# Patient Record
Sex: Male | Born: 1960 | Race: White | Hispanic: No | Marital: Married | Smoking: Former smoker
Health system: Southern US, Community
[De-identification: ages and names within clinical notes are randomized; demographics above are authoritative.]

## PROBLEM LIST (undated history)

## (undated) DIAGNOSIS — E785 Hyperlipidemia, unspecified: Secondary | ICD-10-CM

## (undated) DIAGNOSIS — J45909 Unspecified asthma, uncomplicated: Secondary | ICD-10-CM

## (undated) DIAGNOSIS — E119 Type 2 diabetes mellitus without complications: Secondary | ICD-10-CM

## (undated) DIAGNOSIS — J449 Chronic obstructive pulmonary disease, unspecified: Secondary | ICD-10-CM

## (undated) DIAGNOSIS — I4891 Unspecified atrial fibrillation: Secondary | ICD-10-CM

## (undated) HISTORY — PX: TONSILLECTOMY: SUR1361

## (undated) HISTORY — DX: Hyperlipidemia, unspecified: E78.5

## (undated) HISTORY — PX: SHOULDER SURGERY: SHX246

## (undated) HISTORY — PX: EYE SURGERY: SHX253

## (undated) HISTORY — PX: KNEE SURGERY: SHX244

## (undated) HISTORY — DX: Type 2 diabetes mellitus without complications: E11.9

---

## 1998-09-14 ENCOUNTER — Ambulatory Visit (HOSPITAL_COMMUNITY): Admission: RE | Admit: 1998-09-14 | Discharge: 1998-09-14 | Payer: Self-pay | Admitting: Orthopedic Surgery

## 2000-04-12 ENCOUNTER — Ambulatory Visit (HOSPITAL_BASED_OUTPATIENT_CLINIC_OR_DEPARTMENT_OTHER): Admission: RE | Admit: 2000-04-12 | Discharge: 2000-04-12 | Payer: Self-pay | Admitting: Orthopedic Surgery

## 2001-07-07 ENCOUNTER — Emergency Department (HOSPITAL_COMMUNITY): Admission: EM | Admit: 2001-07-07 | Discharge: 2001-07-07 | Payer: Self-pay | Admitting: Emergency Medicine

## 2001-07-07 ENCOUNTER — Encounter: Payer: Self-pay | Admitting: Emergency Medicine

## 2001-07-16 ENCOUNTER — Emergency Department (HOSPITAL_COMMUNITY): Admission: EM | Admit: 2001-07-16 | Discharge: 2001-07-16 | Payer: Self-pay | Admitting: Emergency Medicine

## 2002-01-08 ENCOUNTER — Emergency Department (HOSPITAL_COMMUNITY): Admission: EM | Admit: 2002-01-08 | Discharge: 2002-01-08 | Payer: Self-pay | Admitting: Emergency Medicine

## 2002-01-08 ENCOUNTER — Encounter: Payer: Self-pay | Admitting: Emergency Medicine

## 2003-12-26 ENCOUNTER — Emergency Department (HOSPITAL_COMMUNITY): Admission: EM | Admit: 2003-12-26 | Discharge: 2003-12-26 | Payer: Self-pay | Admitting: Emergency Medicine

## 2006-04-24 ENCOUNTER — Ambulatory Visit (HOSPITAL_COMMUNITY): Admission: RE | Admit: 2006-04-24 | Discharge: 2006-04-24 | Payer: Self-pay | Admitting: Internal Medicine

## 2007-09-17 ENCOUNTER — Ambulatory Visit (HOSPITAL_COMMUNITY): Admission: RE | Admit: 2007-09-17 | Discharge: 2007-09-17 | Payer: Self-pay | Admitting: Internal Medicine

## 2009-07-10 ENCOUNTER — Encounter: Payer: Self-pay | Admitting: Gastroenterology

## 2009-07-16 ENCOUNTER — Encounter: Payer: Self-pay | Admitting: Gastroenterology

## 2009-07-27 ENCOUNTER — Ambulatory Visit (HOSPITAL_COMMUNITY): Admission: RE | Admit: 2009-07-27 | Discharge: 2009-07-27 | Payer: Self-pay | Admitting: Gastroenterology

## 2009-07-27 ENCOUNTER — Ambulatory Visit: Payer: Self-pay | Admitting: Gastroenterology

## 2009-08-05 ENCOUNTER — Encounter: Payer: Self-pay | Admitting: Gastroenterology

## 2009-08-05 ENCOUNTER — Ambulatory Visit: Admission: RE | Admit: 2009-08-05 | Discharge: 2009-08-05 | Payer: Self-pay | Admitting: Internal Medicine

## 2009-08-05 ENCOUNTER — Encounter: Payer: Self-pay | Admitting: Pulmonary Disease

## 2009-08-31 ENCOUNTER — Encounter: Payer: Self-pay | Admitting: Gastroenterology

## 2009-09-10 DIAGNOSIS — E785 Hyperlipidemia, unspecified: Secondary | ICD-10-CM

## 2009-09-10 DIAGNOSIS — E1165 Type 2 diabetes mellitus with hyperglycemia: Secondary | ICD-10-CM

## 2009-09-11 ENCOUNTER — Ambulatory Visit: Payer: Self-pay | Admitting: Pulmonary Disease

## 2009-09-11 DIAGNOSIS — G4733 Obstructive sleep apnea (adult) (pediatric): Secondary | ICD-10-CM | POA: Insufficient documentation

## 2009-10-19 ENCOUNTER — Ambulatory Visit: Payer: Self-pay | Admitting: Pulmonary Disease

## 2009-11-04 ENCOUNTER — Telehealth (INDEPENDENT_AMBULATORY_CARE_PROVIDER_SITE_OTHER): Payer: Self-pay | Admitting: *Deleted

## 2009-12-22 ENCOUNTER — Telehealth (INDEPENDENT_AMBULATORY_CARE_PROVIDER_SITE_OTHER): Payer: Self-pay | Admitting: *Deleted

## 2010-02-16 NOTE — Letter (Signed)
Summary: Internal Other  Internal Other   Imported By: Peggyann Shoals 08/05/2009 10:02:57  _____________________________________________________________________  External Attachment:    Type:   Image     Comment:   External Document

## 2010-02-16 NOTE — Letter (Signed)
Summary: Internal Other Brett Curry  Internal Other Brett Curry   Imported By: Cloria Spring LPN 11/91/4782 95:62:13  _____________________________________________________________________  External Attachment:    Type:   Image     Comment:   External Document

## 2010-02-16 NOTE — Letter (Signed)
Summary: ENDO RESULT FROM [facility]  ENDO RESULT FROM [facility]   Imported By: Rexene Alberts 08/31/2009 14:21:18  _____________________________________________________________________  External Attachment:    Type:   Image     Comment:   External Document

## 2010-02-16 NOTE — Assessment & Plan Note (Signed)
Summary: consult for management of osa   Copy to:  Carylon Perches Primary Provider/Referring Provider:  Carylon Perches  CC:  Sleep Consult.  History of Present Illness: The pt is a 50y/o male who I have been asked to see for management of osa.  He recently had npsg at [facility], where by split night he had severe osa.  He had 158 obstructive events in the first of sleep, and this gave him an AHI of 90/hr.  He was started on cpap, and found to have optimal pressure of 16cm of water.  The pt has been noted to have loud snoring, and admits to gasping arousals during the night.  He goes to bed around 10pm, and arises at 6:30 am to start his day.  He has frequent awakenings, and is not rested upon arising during the day.  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  He feels he can take a nap almost anytime.  He dozes with watching tv or movies, and does admit to some sleep pressure with driving.  His weight is neutral over the last 2 years.    Current Medications (verified): 1)  Amaryl 2 Mg Tabs (Glimepiride) .... Take 1 Tablet By Mouth Once A Day 2)  Simvastatin 40 Mg Tabs (Simvastatin) .... Take 1 Tab By Mouth At Bedtime 3)  Accolate 20 Mg Tabs (Zafirlukast) .... Take 1 Tablet By Mouth Two Times A Day 4)  Lisinopril 20 Mg Tabs (Lisinopril) .... Take 1 Tablet By Mouth Once A Day 5)  Tramadol Hcl 50 Mg Tabs (Tramadol Hcl) .... Take 1 Tablet By Mouth Three Times A Day As Needed 6)  Albuterol Sulfate 4 Mg Tabs (Albuterol Sulfate) .... Take 1 Tablet By Mouth Two Times A Day 7)  Theophylline Cr 300 Mg Xr12h-Tab (Theophylline) .... Take 1 Tablet By Mouth Two Times A Day 8)  Metformin Hcl 1000 Mg Tabs (Metformin Hcl) .... Take 1 Tablet By Mouth Two Times A Day  Allergies (verified): No Known Drug Allergies  Past History:  Past Medical History: Reviewed history from 09/10/2009 and no changes required.  HYPERLIPIDEMIA  (ICD-272.4) DIABETES MELLITUS, TYPE II (ICD-250.00)  Past Surgical History: R shoulder surgery R knee surgery tonsillectomy  Family History: asthma: sister heart disease: mother (m.i.)   Social History: Patient is a current smoker. 1 1/2 ppd.  started at age 52. pt is married. pt works as a Print production planner       The patient complains of shortness of breath with activity, tooth/dental problems, and joint stiffness or pain.  The patient denies shortness of breath at rest, productive cough, non-productive cough, coughing up blood, chest pain, irregular heartbeats, acid heartburn, indigestion, loss of appetite, weight change, abdominal pain, difficulty swallowing, sore throat, headaches, nasal congestion/difficulty breathing through nose, sneezing, itching, ear ache, anxiety, depression, hand/feet swelling, rash, change in color of mucus, and fever.    Vital Signs:  Patient profile:   50 year old male Height:      72 inches Weight:      363.38 pounds BMI:     49.46 O2 Sat:      95 % on Room air Temp:     97.9 degrees F oral Pulse rate:   118 / minute BP sitting:   120 / 50  (right arm) Cuff size:   large  Vitals Entered By: Arman Filter LPN (September 11, 2009 10:01 AM)  O2 Flow:  Room air CC: Sleep Consult Comments Medications reviewed with patient Arman Filter LPN  September 11, 2009 10:02 AM    Physical Exam  General:  obese male in nad Eyes:  PERRLA and EOMI.   Nose:  narrowed bilat, turbinate hypertrophy Mouth:  very long palate and uvula with narrowing posteriorly Neck:  large, no obvious jvd, tmg, LN Lungs:  fairly clear, no wheezing Heart:  rrr, no mrg Abdomen:  soft and nontender, bs+ Extremities:  2+ edema with venous stasis.  No cyanosis  pulses intact distally, but decreased Neurologic:  alert and oriented, moves all 4.   Impression & Recommendations:  Problem # 1:  OBSTRUCTIVE SLEEP APNEA (ICD-327.23) the pt has severe osa diagnosed by  recent sleep study, and will need aggressive treatment in order to promote improved alertness during the day, and to avoid the known medical complications associated with this disorder.  He is also in a high risk job that can put himself and others at risk if he were to fall asleep.  I have had a long discussion with the pt about sleep apnea, including its impact on QOL and CV health.  I have recommended treatment with cpap while he is working on weight loss.  I will set the patient up on cpap at a moderate pressure level to allow for desensitization, and will troubleshoot the device over the next 4-6weeks if needed.  The pt is to call me if having issues with tolerance.  Will then optimize the pressure once patient is able to wear cpap on a consistent basis.  I have also reminded the pt of his moral responsibility to not drive if he is sleepy.  Medications Added to Medication List This Visit: 1)  Accolate 20 Mg Tabs (Zafirlukast) .... Take 1 tablet by mouth two times a day 2)  Lisinopril 20 Mg Tabs (Lisinopril) .... Take 1 tablet by mouth once a day 3)  Tramadol Hcl 50 Mg Tabs (Tramadol hcl) .... Take 1 tablet by mouth three times a day as needed 4)  Albuterol Sulfate 4 Mg Tabs (Albuterol sulfate) .... Take 1 tablet by mouth two times a day 5)  Theophylline Cr 300 Mg Xr12h-tab (Theophylline) .... Take 1 tablet by mouth two times a day 6)  Metformin Hcl 1000 Mg Tabs (Metformin hcl) .... Take 1 tablet by mouth two times a day  Other Orders: Consultation Level IV (56387) DME Referral (DME)  Patient Instructions: 1)  will start on cpap at moderate pressure level.  Please call if tolerance issues. 2)  work on weight loss 3)  followup with me in 5weeks.

## 2010-02-16 NOTE — Progress Notes (Signed)
Summary: cpap pressure- still waiting  Phone Note Call from Patient Call back at Home Phone [phone]   Caller: Patient Call For: clance Summary of Call: pt was seen 17 days ago by kc. pt was to have pressure on cpap increased to 16 but has not yet heard from [facility]. pt wants to speak to someone re: this asap today.  Initial call taken by: Tivis Ringer, CNA,  November 04, 2009 11:43 AM  Follow-up for Phone Call        spoke to portia and michelle @[facility]  pt will be called today they have been waiting on [employer] to respond to the request pt aware  Follow-up by: Oneita Jolly,  November 04, 2009 11:53 AM

## 2010-02-16 NOTE — Assessment & Plan Note (Signed)
Summary: rov for osa   Visit Type:  Follow-up Copy to:  Carylon Perches Primary Mylan Schwarz/Referring Imajean Mcdermid:  Carylon Perches  CC:  5 week follow up. pt states he uses cpap 7/7 nights x 8-10 hrs a night. Pt states he has no problems with machine/mask/pressure. Marland Kitchen  History of Present Illness: the pt comes in today for f/u of his osa.  He was started on cpap last visit, and has been doing well with the device.  He has no pressure or mask issues, and is sleeping much better.  His alertness during the day is improved as well.  His wife has not seen breakthru events or snoring.  Current Medications (verified): 1)  Amaryl 2 Mg Tabs (Glimepiride) .... Take 1 Tablet By Mouth Once A Day 2)  Simvastatin 40 Mg Tabs (Simvastatin) .... Take 1 Tab By Mouth At Bedtime 3)  Accolate 20 Mg Tabs (Zafirlukast) .... Take 1 Tablet By Mouth Two Times A Day 4)  Lisinopril 20 Mg Tabs (Lisinopril) .... Take 1 Tablet By Mouth Once A Day 5)  Tramadol Hcl 50 Mg Tabs (Tramadol Hcl) .... Take 1 Tablet By Mouth Three Times A Day As Needed 6)  Albuterol Sulfate 4 Mg Tabs (Albuterol Sulfate) .... Take 1 Tablet By Mouth Two Times A Day 7)  Theophylline Cr 300 Mg Xr12h-Tab (Theophylline) .... Take 1 Tablet By Mouth Two Times A Day 8)  Metformin Hcl 1000 Mg Tabs (Metformin Hcl) .... Take 1 Tablet By Mouth Two Times A Day  Allergies (verified): No Known Drug Allergies  Review of Systems       The patient complains of shortness of breath with activity and joint stiffness or pain.  The patient denies shortness of breath at rest, productive cough, non-productive cough, coughing up blood, chest pain, irregular heartbeats, acid heartburn, indigestion, loss of appetite, weight change, abdominal pain, difficulty swallowing, sore throat, tooth/dental problems, headaches, nasal congestion/difficulty breathing through nose, sneezing, itching, ear ache, anxiety, depression, hand/feet swelling, rash, change in color of mucus, and fever.    Vital  Signs:  Patient profile:   50 year old male Height:      72 inches Weight:      371.25 pounds O2 Sat:      92 % on Room air Temp:     98.1 degrees F oral Pulse rate:   100 / minute BP sitting:   122 / 68  (right arm) Cuff size:   large  Vitals Entered By: Carver Fila (October 19, 2009 3:43 PM)  O2 Flow:  Room air CC: 5 week follow up. pt states he uses cpap 7/7 nights x 8-10 hrs a night. Pt states he has no problems with machine/mask/pressure.  Comments meds and allergies updated Phone number updated Carver Fila  October 19, 2009 3:44 PM    Physical Exam  General:  obese male in nad  Nose:  no skin breakdown or pressure necrosis from cpap mask Extremities:  no edema or cyanosis  Neurologic:  alert and oriented, moves all 4. does not appear sleepy   Impression & Recommendations:  Problem # 1:  OBSTRUCTIVE SLEEP APNEA (ICD-327.23) the pt is doing very well with cpap.  He is sleeping better, no breakthru snoring per wife, and has improved alertness during the day.  He is having no issues with pressure or cpap mask.  At this point, will need to get his machine pressure increased to his optimal level of 16cm.  I have also encouraged him to  work aggressively on weight loss.  He will followup with me in 6mos, or sooner if having issues with tolerance.    Other Orders: DME Referral (DME) Est. Patient Level III ([id number])  Patient Instructions: 1)  will have your pressure increased to 16cm .  Please call if any tolerance issues. 2)  work on weight loss 3)  followup with me in 6mos.   Immunization History:  Influenza Immunization History:    Influenza:  historical (10/17/2008)  Pneumovax Immunization History:    Pneumovax:  historical (10/17/2005)

## 2010-02-16 NOTE — Letter (Signed)
Summary: TRIAGE  TRIAGE   Imported By: Diana Eves 07/10/2009 11:57:16  _____________________________________________________________________  External Attachment:    Type:   Image     Comment:   External Document  Appended Document: TRIAGE Hold Glimeperide on AM of procedure. Continue ASA. TRILYTE prep.  Appended Document: TRIAGE Rx and instructions mailed to the pt.

## 2010-02-16 NOTE — Progress Notes (Signed)
  Phone Note Other Incoming   Request: Send information Summary of Call: Request for records received from [facility]. Request forwarded to [facility].     

## 2010-02-24 ENCOUNTER — Inpatient Hospital Stay (HOSPITAL_COMMUNITY)
Admission: AD | Admit: 2010-02-24 | Discharge: 2010-03-01 | DRG: 192 | Disposition: A | Discharge status: Home / Self Care | Payer: Managed Care, Other (non HMO) | Source: Ambulatory Visit | Attending: Internal Medicine | Admitting: Internal Medicine

## 2010-02-24 ENCOUNTER — Inpatient Hospital Stay (HOSPITAL_COMMUNITY): Payer: Managed Care, Other (non HMO)

## 2010-02-24 DIAGNOSIS — E785 Hyperlipidemia, unspecified: Secondary | ICD-10-CM | POA: Diagnosis present

## 2010-02-24 DIAGNOSIS — IMO0001 Reserved for inherently not codable concepts without codable children: Secondary | ICD-10-CM | POA: Diagnosis present

## 2010-02-24 DIAGNOSIS — I872 Venous insufficiency (chronic) (peripheral): Secondary | ICD-10-CM | POA: Diagnosis present

## 2010-02-24 DIAGNOSIS — I10 Essential (primary) hypertension: Secondary | ICD-10-CM | POA: Diagnosis present

## 2010-02-24 DIAGNOSIS — M1990 Unspecified osteoarthritis, unspecified site: Secondary | ICD-10-CM | POA: Diagnosis present

## 2010-02-24 DIAGNOSIS — G4733 Obstructive sleep apnea (adult) (pediatric): Secondary | ICD-10-CM | POA: Diagnosis present

## 2010-02-24 DIAGNOSIS — F17200 Nicotine dependence, unspecified, uncomplicated: Secondary | ICD-10-CM | POA: Diagnosis present

## 2010-02-24 DIAGNOSIS — J441 Chronic obstructive pulmonary disease with (acute) exacerbation: Principal | ICD-10-CM | POA: Diagnosis present

## 2010-02-24 LAB — COMPREHENSIVE METABOLIC PANEL
ALT: 22 U/L (ref 0–53)
AST: 22 U/L (ref 0–37)
CO2: 29 mEq/L (ref 19–32)
Calcium: 9.3 mg/dL (ref 8.4–10.5)
Chloride: 98 mEq/L (ref 96–112)
GFR calc Af Amer: >60 mL/min (ref >60)
GFR calc non Af Amer: >60 mL/min (ref >60)
Glucose, Bld: 163 mg/dL — ABNORMAL HIGH (ref 70–99)
Sodium: 140 mEq/L (ref 135–145)
Total Bilirubin: 0.5 mg/dL (ref 0.3–1.2)

## 2010-02-24 LAB — BLOOD GAS, ARTERIAL
Acid-Base Excess: 1.9 mmol/L (ref 0.0–2.0)
Acid-Base Excess: 2.4 mmol/L — ABNORMAL HIGH (ref 0.0–2.0)
Bicarbonate: 26 mEq/L — ABNORMAL HIGH (ref 20.0–24.0)
Bicarbonate: 26.9 mEq/L — ABNORMAL HIGH (ref 20.0–24.0)
O2 Saturation: 87.9 %
O2 Saturation: 91.1 %
Patient temperature: 37
TCO2: 22.6 mmol/L (ref 0–100)
TCO2: 23.6 mmol/L (ref 0–100)
pCO2 arterial: 40.7 mmHg (ref 35.0–45.0)
pO2, Arterial: 55.9 mmHg — ABNORMAL LOW (ref 80.0–100.0)

## 2010-02-24 LAB — DIFFERENTIAL
Basophils Absolute: 0 10*3/uL (ref 0.0–0.1)
Basophils Relative: 0 % (ref 0–1)
Lymphocytes Relative: 6 % — ABNORMAL LOW (ref 12–46)
Monocytes Absolute: 0.9 10*3/uL (ref 0.1–1.0)
Monocytes Relative: 5 % (ref 3–12)
Neutro Abs: 15.7 10*3/uL — ABNORMAL HIGH (ref 1.7–7.7)
Neutrophils Relative %: 88 % — ABNORMAL HIGH (ref 43–77)

## 2010-02-24 LAB — GLUCOSE, CAPILLARY: Glucose-Capillary: 232 mg/dL — ABNORMAL HIGH (ref 70–99)

## 2010-02-24 LAB — CBC
HCT: 42.8 % (ref 39.0–52.0)
Hemoglobin: 14.5 g/dL (ref 13.0–17.0)
MCH: 31.1 pg (ref 26.0–34.0)
MCHC: 33.9 g/dL (ref 30.0–36.0)
RBC: 4.66 MIL/uL (ref 4.22–5.81)

## 2010-02-24 LAB — THEOPHYLLINE LEVEL: Theophylline Lvl: 6.4 ug/mL — ABNORMAL LOW (ref 10.0–20.0)

## 2010-02-25 LAB — GLUCOSE, CAPILLARY
Glucose-Capillary: 184 mg/dL — ABNORMAL HIGH (ref 70–99)
Glucose-Capillary: 237 mg/dL — ABNORMAL HIGH (ref 70–99)
Glucose-Capillary: 232 mg/dL — ABNORMAL HIGH (ref 70–99)

## 2010-02-26 LAB — GLUCOSE, CAPILLARY
Glucose-Capillary: 211 mg/dL — ABNORMAL HIGH (ref 70–99)
Glucose-Capillary: 211 mg/dL — ABNORMAL HIGH (ref 70–99)

## 2010-02-27 LAB — BASIC METABOLIC PANEL
Calcium: 9.1 mg/dL (ref 8.4–10.5)
GFR calc Af Amer: >60 mL/min (ref >60)
GFR calc non Af Amer: >60 mL/min (ref >60)
Glucose, Bld: 204 mg/dL — ABNORMAL HIGH (ref 70–99)
Sodium: 146 mEq/L — ABNORMAL HIGH (ref 135–145)

## 2010-02-27 LAB — DIFFERENTIAL
Basophils Absolute: 0 10*3/uL (ref 0.0–0.1)
Eosinophils Relative: 0 % (ref 0–5)
Lymphocytes Relative: 8 % — ABNORMAL LOW (ref 12–46)
Neutrophils Relative %: 87 % — ABNORMAL HIGH (ref 43–77)

## 2010-02-27 LAB — CBC
MCH: 30.8 pg (ref 26.0–34.0)
MCHC: 32.5 g/dL (ref 30.0–36.0)
Platelets: 187 10*3/uL (ref 150–400)
RBC: 4.81 MIL/uL (ref 4.22–5.81)

## 2010-02-27 LAB — GLUCOSE, CAPILLARY
Glucose-Capillary: 206 mg/dL — ABNORMAL HIGH (ref 70–99)
Glucose-Capillary: 256 mg/dL — ABNORMAL HIGH (ref 70–99)
Glucose-Capillary: 290 mg/dL — ABNORMAL HIGH (ref 70–99)

## 2010-02-28 LAB — GLUCOSE, CAPILLARY
Glucose-Capillary: 340 mg/dL — ABNORMAL HIGH (ref 70–99)
Glucose-Capillary: 342 mg/dL — ABNORMAL HIGH (ref 70–99)

## 2010-03-01 LAB — GLUCOSE, CAPILLARY: Glucose-Capillary: 359 mg/dL — ABNORMAL HIGH (ref 70–99)

## 2010-03-06 NOTE — Progress Notes (Signed)
  NAMEJAKWAN, SALLY                ACCOUNT NO.:  [id number]  MEDICAL RECORD NO.:  [id number]           PATIENT TYPE:  I  LOCATION:  A328                          FACILITY:  [facility]  PHYSICIAN:  Mila Homer. Sudie Bailey, M.D.DATE OF BIRTH:  1960-06-19  DATE OF PROCEDURE:  02/28/2010 DATE OF DISCHARGE:                                PROGRESS NOTE   SUBJECTIVE:  He is feeling really a good deal better.  He tells me he really has not craved a cigarette at all in the time seen in the hospitalization his admission.  He has really been off cigarettes now for about a week.  OBJECTIVE:  GENERAL:  He is sitting up in bed. VITAL SIGNS:  Temperature 97.5, pulse 97, respiratory rate 23, blood pressure 135/84.  O2 sat is 95% on 5 liters. He is eating lunch.  He is in no acute distress.  Well-developed and morbidly obese.  His color is good.  His sensorium is intact.  Speech is normal. LUNGS:  Show decreased breath sounds throughout and some prolonged expirations and expiratory wheezing throughout but these are mild. HEART:  Has a regular rhythm, rate of about 80.  Glucoses have been 266 and 290.  ASSESSMENT: 1. Chronic obstructive pulmonary disease exacerbation. 2. Type 2 diabetes, uncontrolled.  This is probably worse due to his     steroid use. 3. Benign essential hypertension. 4. Sleep apnea. 5. Morbid obesity. 6. Tobacco use disorder.  PLAN:  Will continue IV antibiotics, bronchodilators and IV corticosteroids with O2 and CPAP at night as before.  IV steroids may be able to be switched to p.o. steroids tomorrow.  He continues on glimepiride and metformin for diabetes and lisinopril for hypertension.     Mila Homer. Sudie Bailey, M.D.     SDK/MEDQ  D:  02/28/2010  T:  02/28/2010  Job:  [id number]  Electronically Signed by John Giovanni M.D. on 03/06/2010 05:20:47 AM

## 2010-03-06 NOTE — Progress Notes (Signed)
  NAMEOBIE, KALLENBACH                ACCOUNT NO.:  [id number]  MEDICAL RECORD NO.:  [id number]           PATIENT TYPE:  I  LOCATION:  A203                          FACILITY:  [facility]  PHYSICIAN:  Mila Homer. Sudie Bailey, M.D.DATE OF BIRTH:  1960-04-17  DATE OF PROCEDURE: DATE OF DISCHARGE:                                PROGRESS NOTE   SUBJECTIVE:  This 50 year old was admitted to the hospital yesterday with COPD exacerbation.  He was fairly dyspneic last night and had a lot of coughing.  OBJECTIVE:  VITAL SIGNS:  Temperature today is 98.2, pulse 98, respiratory rate 24, blood pressure 126/75. GENERAL:  He is sitting up in bed, feet on the floor.  He is on O2.  His color is good, but he has somewhat prolonged expirations and coughing. LUNGS:  Sounds are distant.  There are no obvious intercostal retractions or use of accessory muscles of respiration. HEART:  Sounds are faint, but rate around 90. EXTREMITIES:  He has at least 1+ pitting edema of the distal legs with brawny discoloration of the distal legs in the mid lower leg down to the feet.  LABORATORY DATA:  Today's white cell count is 14,800 of which 87% are neutrophils, 8 lymphs, hemoglobin 14.8,  sodium 146, potassium 5.2, glucose 204.  ASSESSMENT: 1. Chronic obstructive pulmonary disease exacerbation. 2. Diabetes, possibly worse on steroids. 3. Benign essential hypertension. 4. Sleep apnea. 5. Morbid obesity.  PLAN:  Continue IV antibiotics, bronchodilators and IV corticosteroids along with O2 and CPAP at night.     Mila Homer. Sudie Bailey, M.D.     SDK/MEDQ  D:  02/27/2010  T:  02/27/2010  Job:  [id number]  Electronically Signed by John Giovanni M.D. on 03/06/2010 05:20:18 AM

## 2010-03-21 NOTE — H&P (Signed)
Brett Curry, Brett Curry                ACCOUNT NO.:  [id number]  MEDICAL RECORD NO.:  [id number]          PATIENT TYPE:  INP  LOCATION:  A325                          FACILITY:  [facility]  PHYSICIAN:  Kingsley Callander. Ouida Sills, MD       DATE OF BIRTH:  16-Feb-1960  DATE OF ADMISSION:  02/24/2010 DATE OF DISCHARGE:  LH                             HISTORY & PHYSICAL   CHIEF COMPLAINT:  Difficulty breathing.  HISTORY OF PRESENT ILLNESS:  This patient is a 50 year old white male who presented with increasing shortness of breath, cough and wheezing. The patient has been seen 2 days prior to admission and had been treated for a COPD exacerbation with [name], prednisone, albuterol, ipratropium and his usual asthma therapies including theophylline and Accolate.  He has had initiation of home nebulizer therapy without significant improvement.  He has not previously been on supplemental oxygen.  His oxygen saturation was 91% a day.  His oxygen saturation was initially 83% 2 days ago, but improved to 92% with treatment.  He has been a long-term smoker and continues to smoke 2 packs per day.  He has sleep apnea treated with CPAP.  He is morbidly obese.  PAST MEDICAL HISTORY: 1. Diabetes. 2. Hypertension. 3. Hyperlipidemia. 4. Sleep apnea. 5. Asthma/COPD. 6. Colon polyps. 7. Osteoarthritis, status post right shoulder surgery and right knee     surgery. 8. Tonsillectomy.  MEDICATIONS: 1. Ventolin and Atrovent nebulizer treatments q.4 h. 2. [name]. 3. Z-Pak. 4. Prednisone 40 mg daily. 5. Accolate 20 mg b.i.d. 6. Theophylline CR 300 mg b.i.d. 7. Albuterol 4 mg b.i.d. 8. Lisinopril 20 mg daily. 9. Tramadol 50 mg q.i.d. p.r.n. 10.Metformin ER 2000 mg daily. 11.Amaryl 2 mg daily. 12.Aspirin 81 mg daily. 13.Fish oil 1000 mg b.i.d. 14.Simvastatin 40 mg daily.  ALLERGIES:  None.  FAMILY HISTORY:  His mother had a heart attack, diabetes, bypass surgery and hypertension.  His father's health was  unknown.  A brother has had hypertension.  A sister has had asthma.  REVIEW OF SYSTEMS:  Noncontributory.  PHYSICAL EXAMINATION:  VITAL SIGNS:  Temperature 98.2, blood pressure 108/86, pulse 118, oxygen saturation 91%. GENERAL:  Dyspneic, obese white male. HEENT:  Eyes, nose and oropharynx are unremarkable. NECK:  No lymphadenopathy or JVD. LUNGS:  Bilateral wheezes with diffusely diminished breath sounds.  He is tachypneic at approximately 24 breaths per minute.  HEART: Tachycardic with no murmurs. ABDOMEN:  Nontender with no hepatosplenomegaly. EXTREMITIES:  Chronic venous insufficiency changes with mild edema in the lower legs.  No cyanosis or clubbing. NEURO:  Grossly intact. LYMPH NODES:  No cervical or supraclavicular enlargement. SKIN:  Warm and dry.  LABORATORY DATA:  Pending.  IMPRESSION AND PLAN: 1. Chronic obstructive pulmonary disease exacerbation.  He is worse     despite maximum home therapy.  He will be hospitalized and treated     with IV antibiotics, inhaled bronchodilators, IV corticosteroids     and supplemental oxygen.  A chest x-ray and blood gases will be     obtained now. 2. Diabetes.  Treat with sliding scale NovoLog.  We will hold  metformin and Amaryl in his current condition. 3. Hypertension. 4. Hyperlipidemia. 5. Osteoarthritis. 6. Sleep apnea.  Continue CPAP. 7. Colon polyps.     Kingsley Callander. Ouida Sills, MD     ROF/MEDQ  D:  02/24/2010  T:  02/25/2010  Job:  [id number]  Electronically Signed by Carylon Perches MD on 03/21/2010 11:16:20 PM

## 2010-03-21 NOTE — Discharge Summary (Signed)
  Brett Curry, Brett Curry                ACCOUNT NO.:  [id number]  MEDICAL RECORD NO.:  [id number]           PATIENT TYPE:  I  LOCATION:  A328                          FACILITY:  [facility]  PHYSICIAN:  Kingsley Callander. Ouida Sills, MD       DATE OF BIRTH:  1960-11-24  DATE OF ADMISSION:  02/24/2010 DATE OF DISCHARGE:  02/13/2012LH                              DISCHARGE SUMMARY   DISCHARGE DIAGNOSES: 1. Chronic obstructive pulmonary disease exacerbation. 2. Diabetes. 3. Hypertension. 4. Sleep apnea. 5. Hyperlipidemia. 6. Colon polyps. 7. Osteoarthritis.  DISCHARGE MEDICATIONS: 1. Ventolin and Atrovent nebulizer treatments 4 times a day. 2. Oxygen 3 liters by nasal cannula. 3. [name] 250 mg daily. 4. Prednisone 10 mg 4 a day for 3 days, 3 a day for 3 days, 2 a day     for 3 days, 1 a day for 3 days, 1/2 a day for 3 days. 5. Accolate 20 mg b.i.d. 6. Lisinopril 20 mg daily. 7. Tramadol 50 mg q.i.d. p.r.n. 8. Metformin ER 2000 mg daily. 9. Amaryl 2 mg daily. 10.Aspirin 81 mg daily. 11.Simvastatin 40 mg daily.  HOSPITAL COURSE:  This patient is a 50 year old male who presented with shortness of breath, wheezing, and cough.  He was found to have a COPD exacerbation.  His oxygen saturation was in the low 80s on room air.  He has a history of smoking 2 packs per day.  His chest x-ray revealed COPD.  His ABG initially revealed a pH of 7.42, pCO2 of 40, and a pO2 of 55.  White count was 17.7 with 88 segs.  Glucose was 163.  Theophylline level was 6.4.  He had a repeat ABG on oxygen at 4.5 liter revealing a pH of 7.39, pCO2 of 44, and a pO2 of 65.  He was treated with Ventolin and Atrovent nebulizer treatments, IV Solu-Medrol, and IV [name].  He gradually improved.  He required continued supplemental oxygen to maintain saturations above 90%.  He will require home oxygen.  He was significantly improved and stable for discharge on the morning of March 01, 2010.  He will be seen in followup in the  office in 2 weeks.  He will be continued on oral prednisone, oral [name], Accolate, and Ventolin and Atrovent nebulizer treatments.  Theophylline and oral albuterol were discontinued.  His diabetes was treated with sliding scale NovoLog in addition to Amaryl and metformin.     Kingsley Callander. Ouida Sills, MD     ROF/MEDQ  D:  03/01/2010  T:  03/01/2010  Job:  [id number]  Electronically Signed by Carylon Perches MD on 03/21/2010 11:16:17 PM

## 2010-04-04 LAB — GLUCOSE, CAPILLARY: Glucose-Capillary: 142 mg/dL — ABNORMAL HIGH (ref 70–99)

## 2010-04-19 ENCOUNTER — Ambulatory Visit: Payer: Self-pay | Admitting: Pulmonary Disease

## 2010-04-20 ENCOUNTER — Encounter: Payer: Self-pay | Admitting: Pulmonary Disease

## 2010-04-22 ENCOUNTER — Ambulatory Visit (INDEPENDENT_AMBULATORY_CARE_PROVIDER_SITE_OTHER): Payer: Managed Care, Other (non HMO) | Admitting: Pulmonary Disease

## 2010-04-22 ENCOUNTER — Encounter: Payer: Self-pay | Admitting: Pulmonary Disease

## 2010-04-22 VITALS — BP 92/58 | HR 96 | Temp 98.6°F | Ht 73.0 in | Wt 376.6 lb

## 2010-04-22 DIAGNOSIS — G4733 Obstructive sleep apnea (adult) (pediatric): Secondary | ICD-10-CM

## 2010-04-22 NOTE — Assessment & Plan Note (Signed)
The pt is wearing cpap compliantly, and feels it is helping sleep quality.  He has not seen a huge difference in his daytime fatigue, but I have explained this is different from true sleepiness.  He is having some issues with mask leaks, and I have showed him a different mask to consider.  I have asked him to stay on cpap, and to work on weight loss aggressively.

## 2010-04-22 NOTE — Progress Notes (Signed)
  Subjective:    Patient ID: Brett Curry, male    DOB: [birth year], 50 y.o.   MRN: [id number]  HPI The pt comes in today for f/u of his known osa.  He is wearing cpap very compliantly by his download, and feels that he is sleeping better.  He has no issues with pressure, but is having mask leak issues at times.  It does not bother him, but does bother his wife.  His download does show significant leak as well.  He is always tired, but has a lot of other medical issues and is morbidly obese.    Review of Systems  Constitutional: Negative for fever and unexpected weight change.  HENT: Negative for ear pain, nosebleeds, congestion, sore throat, rhinorrhea, sneezing, trouble swallowing, dental problem, postnasal drip and sinus pressure.   Eyes: Negative for redness and itching.  Respiratory: Positive for shortness of breath and wheezing. Negative for cough and chest tightness.   Cardiovascular: Positive for chest pain and leg swelling. Negative for palpitations.  Gastrointestinal: Negative for nausea and vomiting.  Genitourinary: Negative for dysuria.  Musculoskeletal: Negative for joint swelling.  Skin: Negative for rash.  Neurological: Negative for headaches.  Hematological: Does not bruise/bleed easily.  Psychiatric/Behavioral: Negative for dysphoric mood. The patient is not nervous/anxious.        Objective:   Physical Exam Obese male in nad No skin breakdown or pressure necrosis from cpap mask  Cor with rrr LE with mild edema, no cyanosis  Alert, does not appear sleepy, moves all 4        Assessment & Plan:

## 2010-04-22 NOTE — Progress Notes (Deleted)
  Subjective:    Patient ID: Brett Curry, male    DOB: [birth year], 50 y.o.   MRN: [id number]  HPI    Review of Systems  Constitutional: Negative for fever and unexpected weight change.  HENT: Negative for ear pain, nosebleeds, congestion, sore throat, rhinorrhea, sneezing, trouble swallowing, dental problem, postnasal drip and sinus pressure.   Eyes: Negative for redness and itching.  Respiratory: Negative for cough, chest tightness, shortness of breath and wheezing.   Cardiovascular: Negative for palpitations and leg swelling.  Gastrointestinal: Negative for nausea and vomiting.  Genitourinary: Negative for dysuria.  Musculoskeletal: Negative for joint swelling.  Skin: Negative for rash.  Neurological: Negative for headaches.  Hematological: Does not bruise/bleed easily.  Psychiatric/Behavioral: Negative for dysphoric mood. The patient is not nervous/anxious.        Objective:   Physical Exam        Assessment & Plan:

## 2010-04-22 NOTE — Patient Instructions (Signed)
Continue wearing cpap, work on weight loss If you continue to have mask leaks, think about trying resmed quattro FX full face mask followup with me in one year.

## 2010-06-04 NOTE — Op Note (Signed)
[place]  Patient:    Brett Curry, Brett Curry                         MRN: [id number] Proc. Date: 04/12/00 Adm. 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  POSTOPERATIVE DIAGNOSES: 1. 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 2. Chondromalacia of the patella.  PROCEDURES: 1. Debridement of medial meniscal tear with corresponding debridement of    medial tibial plateau defect as well as medial femoral condylar defect. 2. Debridement of chondromalacia of the patella.  SURGEON:  Harvie Junior, M.D.  ASSISTANT:  Currie Paris. Thedore Mins.  ANESTHESIA:  Knee block.  BRIEF HISTORY:  He is a 50 year old male with a long history of having inboard-sided knee pain.  He began having catching and locking.  He was treated conservatively initially but because of continued complaints of pain was ultimately taken to the operating room for evaluation under anesthesia and arthroscopy.  DESCRIPTION OF PROCEDURE:  The patient was taken to the operating room and after adequate anesthesia was obtained with a knee block, the patient was placed supine upon the operating table.  The right leg was prepped and draped in the usual sterile fashion and following this, routine arthroscopic examination of the knee revealed there was some obvious chondromalacia on the undersurface of the patella.  There was no significant problem on the corresponding area of the trochlea.  This was debrided with a motorized suction shaver.  The patellar tracking was midline, and there was no tendency toward hyper-pressure of the patella.  At this point the medial joint line was explored, and there was noted to be a fair amount of a plica prohibiting entrance into the medial compartment.  This was debrided with the suction shaver.   Attention was then turned to the medial meniscus, which was noted to have a radial tear in the posterior aspect.  This was flapped up and locking in the knee joint.  We debrided this radial meniscal tear as well as there was a 5 mm grade 4 area of injury on the medial tibial plateau.  This was debrided with a suction shaver.  Attention was then turned to the medial femoral condyle, where there were noted to be some grade 2 and 3 changes.  Palpably with a probe I felt like I could feel bone, but I did not see exposed bone, and the area was smoothed with the shaver and no bone was exposed.  At this point attention was turned toward the notch, where the anterior cruciate was noted to be normal.  The lateral side was examined.  The meniscus and lateral femoral condyle were within normal limits.  Following this, one final check was made of the medial side to make sure the meniscus was stable.  It was probed and was.  The loose remaining fragmented pieces were removed.  The medial femoral condyle was probed.  Attention was then turned back up into the patellofemoral joint, which was noted to have midline tracking, and then the cannula was removed from the knee, all water was removed from the knee, and the bandages were applied and a sterile compressive dressing.  The patient was then taken recovery, where  he was noted to be in stable condition.  Estimated blood loss for the procedure was none. DD:  04/12/00 TD:  04/13/00 Job: [id number]

## 2011-07-26 ENCOUNTER — Other Ambulatory Visit (HOSPITAL_COMMUNITY): Payer: Self-pay | Admitting: Internal Medicine

## 2011-07-26 DIAGNOSIS — N644 Mastodynia: Secondary | ICD-10-CM

## 2011-08-03 ENCOUNTER — Ambulatory Visit (HOSPITAL_COMMUNITY)
Admission: RE | Admit: 2011-08-03 | Discharge: 2011-08-03 | Disposition: A | Discharge status: Home / Self Care | Payer: BC Managed Care – PPO | Source: Ambulatory Visit | Attending: Internal Medicine | Admitting: Internal Medicine

## 2011-08-03 ENCOUNTER — Other Ambulatory Visit (HOSPITAL_COMMUNITY): Payer: Self-pay | Admitting: Internal Medicine

## 2011-08-03 DIAGNOSIS — N644 Mastodynia: Secondary | ICD-10-CM | POA: Insufficient documentation

## 2011-08-03 DIAGNOSIS — N6310 Unspecified lump in the right breast, unspecified quadrant: Secondary | ICD-10-CM

## 2011-08-10 ENCOUNTER — Ambulatory Visit (HOSPITAL_COMMUNITY)
Admission: RE | Admit: 2011-08-10 | Discharge: 2011-08-10 | Disposition: A | Discharge status: Home / Self Care | Payer: BC Managed Care – PPO | Source: Ambulatory Visit | Attending: Internal Medicine | Admitting: Internal Medicine

## 2011-08-10 ENCOUNTER — Other Ambulatory Visit (HOSPITAL_COMMUNITY): Payer: Self-pay | Admitting: Internal Medicine

## 2011-08-10 ENCOUNTER — Other Ambulatory Visit (HOSPITAL_COMMUNITY): Payer: BC Managed Care – PPO

## 2011-08-10 DIAGNOSIS — N62 Hypertrophy of breast: Secondary | ICD-10-CM | POA: Insufficient documentation

## 2011-08-10 DIAGNOSIS — N63 Unspecified lump in unspecified breast: Secondary | ICD-10-CM | POA: Insufficient documentation

## 2011-08-10 DIAGNOSIS — N6310 Unspecified lump in the right breast, unspecified quadrant: Secondary | ICD-10-CM

## 2011-08-10 NOTE — Progress Notes (Signed)
Lidocaine 2%         10mL injected                         Right breast biopsy performed  

## 2012-09-19 ENCOUNTER — Inpatient Hospital Stay (HOSPITAL_COMMUNITY)
Admission: EM | Admit: 2012-09-19 | Discharge: 2012-09-21 | DRG: 087 | Disposition: A | Discharge status: Home / Self Care | Payer: BC Managed Care – PPO | Attending: Internal Medicine | Admitting: Internal Medicine

## 2012-09-19 ENCOUNTER — Encounter (HOSPITAL_COMMUNITY): Payer: Self-pay

## 2012-09-19 ENCOUNTER — Emergency Department (HOSPITAL_COMMUNITY): Payer: BC Managed Care – PPO

## 2012-09-19 DIAGNOSIS — I10 Essential (primary) hypertension: Secondary | ICD-10-CM | POA: Diagnosis present

## 2012-09-19 DIAGNOSIS — Z23 Encounter for immunization: Secondary | ICD-10-CM

## 2012-09-19 DIAGNOSIS — J9600 Acute respiratory failure, unspecified whether with hypoxia or hypercapnia: Principal | ICD-10-CM

## 2012-09-19 DIAGNOSIS — Z79899 Other long term (current) drug therapy: Secondary | ICD-10-CM

## 2012-09-19 DIAGNOSIS — R0902 Hypoxemia: Secondary | ICD-10-CM | POA: Diagnosis not present

## 2012-09-19 DIAGNOSIS — D72829 Elevated white blood cell count, unspecified: Secondary | ICD-10-CM | POA: Diagnosis present

## 2012-09-19 DIAGNOSIS — J441 Chronic obstructive pulmonary disease with (acute) exacerbation: Secondary | ICD-10-CM | POA: Diagnosis present

## 2012-09-19 DIAGNOSIS — IMO0002 Reserved for concepts with insufficient information to code with codable children: Secondary | ICD-10-CM

## 2012-09-19 DIAGNOSIS — G4733 Obstructive sleep apnea (adult) (pediatric): Secondary | ICD-10-CM | POA: Diagnosis not present

## 2012-09-19 DIAGNOSIS — Z87891 Personal history of nicotine dependence: Secondary | ICD-10-CM

## 2012-09-19 DIAGNOSIS — J9601 Acute respiratory failure with hypoxia: Secondary | ICD-10-CM

## 2012-09-19 DIAGNOSIS — J9602 Acute respiratory failure with hypercapnia: Secondary | ICD-10-CM

## 2012-09-19 DIAGNOSIS — E119 Type 2 diabetes mellitus without complications: Secondary | ICD-10-CM | POA: Diagnosis not present

## 2012-09-19 DIAGNOSIS — J40 Bronchitis, not specified as acute or chronic: Secondary | ICD-10-CM | POA: Diagnosis not present

## 2012-09-19 DIAGNOSIS — R0602 Shortness of breath: Secondary | ICD-10-CM | POA: Diagnosis not present

## 2012-09-19 DIAGNOSIS — E785 Hyperlipidemia, unspecified: Secondary | ICD-10-CM | POA: Diagnosis present

## 2012-09-19 DIAGNOSIS — E872 Acidosis, unspecified: Secondary | ICD-10-CM | POA: Diagnosis present

## 2012-09-19 DIAGNOSIS — E1165 Type 2 diabetes mellitus with hyperglycemia: Secondary | ICD-10-CM | POA: Diagnosis present

## 2012-09-19 HISTORY — DX: Unspecified asthma, uncomplicated: J45.909

## 2012-09-19 LAB — BLOOD GAS, ARTERIAL
Acid-Base Excess: 5.3 mmol/L — ABNORMAL HIGH (ref 0.0–2.0)
Acid-Base Excess: 5 mmol/L — ABNORMAL HIGH (ref 0.0–2.0)
Bicarbonate: 30 mEq/L — ABNORMAL HIGH (ref 20.0–24.0)
Bicarbonate: 30.5 mEq/L — ABNORMAL HIGH (ref 20.0–24.0)
Bicarbonate: 29.6 mEq/L — ABNORMAL HIGH (ref 20.0–24.0)
Delivery systems: POSITIVE
Drawn by: 21310
Expiratory PAP: 5
FIO2: 0.5 %
FIO2: 50 %
O2 Saturation: 98.3 %
O2 Saturation: 88.1 %
Patient temperature: 37
Patient temperature: 37
TCO2: 26.7 mmol/L (ref 0–100)
TCO2: 26.3 mmol/L (ref 0–100)
pCO2 arterial: 58.9 mmHg (ref 35.0–45.0)
pH, Arterial: 7.396 (ref 7.350–7.450)
pO2, Arterial: 162 mmHg — ABNORMAL HIGH (ref 80.0–100.0)

## 2012-09-19 LAB — GLUCOSE, CAPILLARY
Glucose-Capillary: 202 mg/dL — ABNORMAL HIGH (ref 70–99)
Glucose-Capillary: 184 mg/dL — ABNORMAL HIGH (ref 70–99)
Glucose-Capillary: 324 mg/dL — ABNORMAL HIGH (ref 70–99)

## 2012-09-19 LAB — CBC WITH DIFFERENTIAL/PLATELET
Basophils Absolute: 0 10*3/uL (ref 0.0–0.1)
Lymphocytes Relative: 14 % (ref 12–46)
Lymphs Abs: 2.1 10*3/uL (ref 0.7–4.0)
Neutrophils Relative %: 75 % (ref 43–77)
Platelets: 196 10*3/uL (ref 150–400)
RBC: 4.68 MIL/uL (ref 4.22–5.81)
RDW: 14.3 % (ref 11.5–15.5)
WBC: 14.9 10*3/uL — ABNORMAL HIGH (ref 4.0–10.5)

## 2012-09-19 LAB — BASIC METABOLIC PANEL
CO2: 33 mEq/L — ABNORMAL HIGH (ref 19–32)
Calcium: 10.1 mg/dL (ref 8.4–10.5)
GFR calc non Af Amer: >90 mL/min (ref >90)
Glucose, Bld: 134 mg/dL — ABNORMAL HIGH (ref 70–99)
Potassium: 4.6 mEq/L (ref 3.5–5.1)
Sodium: 139 mEq/L (ref 135–145)

## 2012-09-19 LAB — TROPONIN I: Troponin I: <0.3 ng/mL (ref <0.30)

## 2012-09-19 MED ORDER — ONDANSETRON HCL 4 MG/2ML IJ SOLN: 4.0000 mg | Freq: Four times a day (QID) | INTRAMUSCULAR | Status: DC | PRN

## 2012-09-19 MED ORDER — MORPHINE SULFATE 2 MG/ML IJ SOLN: 2.0000 mg | INTRAMUSCULAR | Status: DC | PRN

## 2012-09-19 MED ORDER — DEXTROSE 5 % IV SOLN
1.0000 g | INTRAVENOUS | Status: DC
  Administered 2012-09-20 – 2012-09-21 (×2): 1 g via INTRAVENOUS
  Filled 2012-09-19 (×2): qty 10

## 2012-09-19 MED ORDER — THEOPHYLLINE ER 300 MG PO TB12
300.0000 mg | ORAL_TABLET | Freq: Two times a day (BID) | ORAL | Status: DC
  Administered 2012-09-19 – 2012-09-20 (×4): 300 mg via ORAL
  Filled 2012-09-19 (×4): qty 1

## 2012-09-19 MED ORDER — METHYLPREDNISOLONE SODIUM SUCC 125 MG IJ SOLR
125.0000 mg | Freq: Once | INTRAMUSCULAR | Status: AC
  Administered 2012-09-19: 125 mg via INTRAVENOUS
  Filled 2012-09-19: qty 2

## 2012-09-19 MED ORDER — INSULIN ASPART 100 UNIT/ML ~~LOC~~ SOLN
0.0000 [IU] | Freq: Four times a day (QID) | SUBCUTANEOUS | Status: DC
  Administered 2012-09-19: 3 [IU] via SUBCUTANEOUS

## 2012-09-19 MED ORDER — DEXTROSE 5 % IV SOLN
1.0000 g | Freq: Once | INTRAVENOUS | Status: AC
  Administered 2012-09-19: 1 g via INTRAVENOUS
  Filled 2012-09-19: qty 10

## 2012-09-19 MED ORDER — IPRATROPIUM BROMIDE 0.02 % IN SOLN
0.5000 mg | Freq: Four times a day (QID) | RESPIRATORY_TRACT | Status: DC
  Administered 2012-09-19 – 2012-09-21 (×9): 0.5 mg via RESPIRATORY_TRACT
  Filled 2012-09-19 (×8): qty 2.5

## 2012-09-19 MED ORDER — SIMVASTATIN 20 MG PO TABS
40.0000 mg | ORAL_TABLET | Freq: Every day | ORAL | Status: DC
  Administered 2012-09-19 – 2012-09-20 (×2): 40 mg via ORAL
  Filled 2012-09-19 (×2): qty 2

## 2012-09-19 MED ORDER — ALBUTEROL SULFATE (5 MG/ML) 0.5% IN NEBU
2.5000 mg | INHALATION_SOLUTION | Freq: Four times a day (QID) | RESPIRATORY_TRACT | Status: DC
  Administered 2012-09-19 – 2012-09-21 (×9): 2.5 mg via RESPIRATORY_TRACT
  Filled 2012-09-19 (×8): qty 0.5

## 2012-09-19 MED ORDER — SODIUM CHLORIDE 0.9 % IV BOLUS (SEPSIS)
1000.0000 mL | Freq: Once | INTRAVENOUS | Status: AC
  Administered 2012-09-19: 1000 mL via INTRAVENOUS

## 2012-09-19 MED ORDER — ONDANSETRON HCL 4 MG PO TABS: 4.0000 mg | ORAL_TABLET | Freq: Four times a day (QID) | ORAL | Status: DC | PRN

## 2012-09-19 MED ORDER — INSULIN ASPART 100 UNIT/ML ~~LOC~~ SOLN
0.0000 [IU] | Freq: Three times a day (TID) | SUBCUTANEOUS | Status: DC
  Administered 2012-09-19: 2 [IU] via SUBCUTANEOUS
  Administered 2012-09-20 (×2): 5 [IU] via SUBCUTANEOUS
  Administered 2012-09-20: 7 [IU] via SUBCUTANEOUS
  Administered 2012-09-21: 5 [IU] via SUBCUTANEOUS

## 2012-09-19 MED ORDER — SODIUM CHLORIDE 0.9 % IJ SOLN: 3.0000 mL | Freq: Two times a day (BID) | INTRAMUSCULAR | Status: DC

## 2012-09-19 MED ORDER — METHYLPREDNISOLONE SODIUM SUCC 125 MG IJ SOLR
60.0000 mg | Freq: Four times a day (QID) | INTRAMUSCULAR | Status: DC
  Administered 2012-09-19 – 2012-09-21 (×7): 60 mg via INTRAVENOUS
  Filled 2012-09-19 (×8): qty 2

## 2012-09-19 MED ORDER — SODIUM CHLORIDE 0.9 % IJ SOLN: 3.0000 mL | INTRAMUSCULAR | Status: DC | PRN

## 2012-09-19 MED ORDER — MAGNESIUM SULFATE 40 MG/ML IJ SOLN
2.0000 g | Freq: Once | INTRAMUSCULAR | Status: AC
  Administered 2012-09-19: 2 g via INTRAVENOUS
  Filled 2012-09-19: qty 50

## 2012-09-19 MED ORDER — SODIUM CHLORIDE 0.9 % IJ SOLN
3.0000 mL | Freq: Two times a day (BID) | INTRAMUSCULAR | Status: DC
  Administered 2012-09-19 – 2012-09-20 (×4): 3 mL via INTRAVENOUS

## 2012-09-19 MED ORDER — ALBUTEROL (5 MG/ML) CONTINUOUS INHALATION SOLN
15.0000 mg/h | INHALATION_SOLUTION | RESPIRATORY_TRACT | Status: DC
  Administered 2012-09-19: 15 mg/h via RESPIRATORY_TRACT
  Filled 2012-09-19: qty 20

## 2012-09-19 MED ORDER — ENOXAPARIN SODIUM 40 MG/0.4ML ~~LOC~~ SOLN
40.0000 mg | Freq: Every day | SUBCUTANEOUS | Status: DC
  Administered 2012-09-19 – 2012-09-20 (×2): 40 mg via SUBCUTANEOUS
  Filled 2012-09-19 (×2): qty 0.4

## 2012-09-19 MED ORDER — SODIUM CHLORIDE 0.9 % IV SOLN: 250.0000 mL | INTRAVENOUS | Status: DC | PRN

## 2012-09-19 MED ORDER — DEXTROSE 5 % IV SOLN
500.0000 mg | INTRAVENOUS | Status: DC
  Administered 2012-09-19 – 2012-09-21 (×3): 500 mg via INTRAVENOUS
  Filled 2012-09-19 (×3): qty 500

## 2012-09-19 MED ORDER — ALBUTEROL SULFATE (5 MG/ML) 0.5% IN NEBU: 2.5000 mg | INHALATION_SOLUTION | RESPIRATORY_TRACT | Status: DC | PRN

## 2012-09-19 MED ORDER — LISINOPRIL 10 MG PO TABS
20.0000 mg | ORAL_TABLET | Freq: Every day | ORAL | Status: DC
  Administered 2012-09-19 – 2012-09-20 (×2): 20 mg via ORAL
  Filled 2012-09-19 (×3): qty 2

## 2012-09-19 MED ORDER — ACETAMINOPHEN 650 MG RE SUPP: 650.0000 mg | Freq: Four times a day (QID) | RECTAL | Status: DC | PRN

## 2012-09-19 MED ORDER — MONTELUKAST SODIUM 10 MG PO TABS
10.0000 mg | ORAL_TABLET | Freq: Every day | ORAL | Status: DC
  Administered 2012-09-19 – 2012-09-20 (×2): 10 mg via ORAL
  Filled 2012-09-19 (×2): qty 1

## 2012-09-19 MED ORDER — ACETAMINOPHEN 325 MG PO TABS: 650.0000 mg | ORAL_TABLET | Freq: Four times a day (QID) | ORAL | Status: DC | PRN

## 2012-09-19 NOTE — ED Notes (Signed)
Respiratory paged for ABG.  

## 2012-09-19 NOTE — Consult Note (Signed)
Brett Curry, Brett Curry                ACCOUNT NO.:  [id number]  MEDICAL RECORD NO.:  [id number]  LOCATION:  IC11                          FACILITY:  [facility]  PHYSICIAN:  Ludean Duhart L. Juanetta Gosling, M.D.DATE OF BIRTH:  11-07-1960  DATE OF CONSULTATION:  09/19/2012 DATE OF DISCHARGE:                                CONSULTATION   Patient of Dr. Ouida Sills and now in with the [facility].  REASON FOR CONSULTATION:  Respiratory failure.  HISTORY:  This is a 52 year old, who has a history of COPD and asthma and who says he has been short of breath for about a week.  He had seen his primary care physician approximately a week ago.  He was started on Ceftin and prednisone, but has not improved.  His shortness of breath has progressively increased.  He is not having any chest pain.  No fever.  He has not been able to cough up much sputum.  He says he has had problems with "asthma all of his life."  FAMILY HISTORY:  He has a positive family history of lung problems in his mother and his sister.  PAST MEDICAL HISTORY:  Positive for diabetes mellitus, hypertension, asthma.  MEDICATIONS AT HOME: 1. Albuterol tablets 4 mg twice a day. 2. Amaryl 2 mg daily. 3. Lisinopril 20 mg daily. 4. Metformin 1000 mg b.i.d. 5. Simvastatin 40 mg at bedtime. 6. Theophylline 300 mg b.i.d. 7. Tramadol 50 mg t.i.d. 8. Accolate 20 mg b.i.d.  ALLERGIES:  He has no known drug allergies.  SOCIAL HISTORY:  He has about a 45-pack year smoking history.  He stopped approximately 2 years ago.  He uses alcohol on weekends.  FAMILY HISTORY:  Positive as mentioned for asthma in his mother and his sister.  PHYSICAL EXAMINATION:  GENERAL:  Obese male in no acute distress.  He is coughing at intervals during the exam. HEENT:  His pupils are reactive.  Mucous membranes are moist. NECK:  Supple without masses. HEART:  Regular without gallop.  Heart sounds are somewhat distant. LUNGS:  His respirations show decreased breath  sounds, prolonged expiration and end-expiratory wheezes.  He has what appears to be chronic venous stasis on his legs bilaterally. CENTRAL NERVOUS SYSTEM:  Grossly intact.  IMAGING:  Chest x-ray shows probable pneumonia.  He had been on BiPAP and now is back on nasal cannula.  ASSESSMENT:  He has chronic obstructive pulmonary disease with acute respiratory failure.  He says he has never been checked for alpha 1 antitrypsin deficiency, so I will go ahead and order that considering the family history.  It is more likely this is typical chronic obstructive pulmonary disease from smoking and history of asthma.  I agree with treatment with steroids and antibiotics for community- acquired pneumonia.  At this point, I do not think he needs BiPAP, but that may change.  Thank you for allowing me to see him with you.  I will plan to follow with you.     [name] L. Juanetta Gosling, M.D.     ELH/MEDQ  D:  09/19/2012  T:  09/19/2012  Job:  [id number]

## 2012-09-19 NOTE — Progress Notes (Signed)
UR chart review completed.  

## 2012-09-19 NOTE — ED Notes (Signed)
CRITICAL VALUE ALERT  Critical value received:  pco2 58.9  Date of notification:  09/19/2012  Time of notification:  0735  Critical value read back:yes  Nurse who received alert:  c Manika Hast rn  MD notified (1st page):  Dr Irene Limbo  Time of first page:  [phone]  MD notified (2nd page):  Time of second page:  Responding MD:  Dr Irene Limbo  Time MD responded:  [phone]

## 2012-09-19 NOTE — H&P (Signed)
History and Physical  Brett Curry [facility] ZOX:[id number] DOB: 12/24/1960 DOA: 09/19/2012  Referring physician: Rochele Raring, DP PCP: Carylon Perches, MD   Chief Complaint: Short of breath  HPI:  52 year old man with history of COPD presented to the emergency department with progressive shortness of breath. Initial evaluation revealed acute hypoxic respiratory failure, patient was placed on BiPAP and referred for admission for presumed COPD exacerbation. Dr. Elesa Massed discussed with my partner Dr. Sharl Ma and patient was accepted for admission.  History obtained from patient and wife at bedside. Symptoms have been present since last Wednesday, gradually increasing shortness of breath, dyspnea on exertion. Some mild improvement with rest. Patient saw Dr. Ouida Sills last week. He was started on prednisone and Ceftin. Despite these medications and nebulizer therapy he failed to improve. Over the course of last several days his breathing has worsened. He has had a dry cough, nonproductive. He has a history of COPD, quit smoking, has never been intubated, last admitted to the hospital 2012.  In the emergency department was noted to be tachycardic, hypoxic and placed on BiPAP. Blood pressure was normal. Basic metabolic panel with mild elevation of CO2 suggesting chronic respiratory failure. Troponin unremarkable. Mild leukocytosis noted, patient also on steroids as an outpatient. EKG nonacute. Chest x-ray unremarkable. Blood cultures obtained. Initial ABG revealed compensated hypercapnia. Repeat ABG revealed acute respiratory acidosis and increased PCO2.  Review of Systems:  Negative for fever, visual changes, sore throat, rash, new muscle aches, chest pain, dysuria, bleeding, n/v/abdominal pain.  Past Medical History  Diagnosis Date  . Hyperlipidemia   . Type II or unspecified type diabetes mellitus without mention of complication, not stated as uncontrolled   . Asthma     Past Surgical History  Procedure Laterality Date   . Shoulder surgery      right  . Knee surgery      right  . Tonsillectomy      Social History:  reports that he quit smoking about 2 years ago. His smoking use included Cigarettes. He has a 45 pack-year smoking history. He has never used smokeless tobacco. He reports that  drinks alcohol. He reports that he does not use illicit drugs.  No Known Allergies  Family History  Problem Relation Age of Onset  . Asthma Sister   . Heart attack Mother      Prior to Admission medications   Medication Sig Start Date End Date Taking? Authorizing Provider  albuterol (PROVENTIL) 4 MG tablet Take 4 mg by mouth 2 (two) times daily.     Yes Historical Provider, MD  cefUROXime (CEFTIN) 250 MG tablet Take 250 mg by mouth 2 (two) times daily.   Yes Historical Provider, MD  glimepiride (AMARYL) 2 MG tablet Take 2 mg by mouth daily.     Yes Historical Provider, MD  lisinopril (PRINIVIL,ZESTRIL) 20 MG tablet Take 20 mg by mouth daily.     Yes Historical Provider, MD  metFORMIN (GLUCOPHAGE) 1000 MG tablet Take 1,000 mg by mouth 2 (two) times daily.     Yes Historical Provider, MD  pioglitazone (ACTOS) 15 MG tablet Take 15 mg by mouth daily.   Yes Historical Provider, MD  predniSONE (DELTASONE) 10 MG tablet Take 10 mg by mouth daily.   Yes Historical Provider, MD  theophylline (THEODUR) 300 MG 12 hr tablet Take 300 mg by mouth 2 (two) times daily.     Yes Historical Provider, MD  traMADol (ULTRAM) 50 MG tablet Take 50 mg by mouth 3 (three) times daily as  needed.     Yes Historical Provider, MD  simvastatin (ZOCOR) 40 MG tablet Take 40 mg by mouth at bedtime.      Historical Provider, MD  zafirlukast (ACCOLATE) 20 MG tablet Take 20 mg by mouth 2 (two) times daily.      Historical Provider, MD   Physical Exam: Filed Vitals:   09/19/12 0600 09/19/12 0625 09/19/12 0652 09/19/12 0730  BP: 106/59  117/61 113/62  Pulse: 109  108 103  Temp:      TempSrc:      Resp: 20  17 20   Height:      Weight:      SpO2:  96% 97% 98% 99%   General: Examined in the emergency department. Appears calm and comfortable on BiPAP. Able to speak in short sentences. Follows commands appropriately. Eyes: PERRL, normal lids, irises  ENT: grossly normal hearing, lips  Neck: no LAD, masses or thyromegaly Cardiovascular: RRR, no m/r/g. 2+ bilateral lower extremity edema. Venous stasis changes seen. Respiratory: Poor air movement. No frank wheezes, rales or rhonchi. Moderate increased respiratory effort. Nontoxic. Abdomen: soft, ntnd. Obese. Skin: As above. Musculoskeletal: grossly normal tone BUE/BLE Psychiatric: grossly normal mood and affect, speech fluent and appropriate. Oriented to self, [facility], month, year. Neurologic: grossly non-focal.  Wt Readings from Last 3 Encounters:  09/19/12 172.367 kg (380 lb)  04/22/10 170.825 kg (376 lb 9.6 oz)  10/19/09 168.398 kg (371 lb 4 oz)    Labs on Admission:  Basic Metabolic Panel:  Recent Labs Lab 09/19/12 0557  NA 139  K 4.6  CL 96  CO2 33*  GLUCOSE 134*  BUN 18  CREATININE 0.80  CALCIUM 10.1    CBC:  Recent Labs Lab 09/19/12 0557  WBC 14.9*  NEUTROABS 11.2*  HGB 14.2  HCT 43.7  MCV 93.4  PLT 196    Cardiac Enzymes:  Recent Labs Lab 09/19/12 0557  TROPONINI <0.30   Radiological Exams on Admission: Dg Chest Portable 1 View  09/19/2012   *RADIOLOGY REPORT*  Clinical Data: Shortness of breath.  History of asthma.  PORTABLE CHEST - 1 VIEW  Comparison: 02/24/2010.  Findings: Under penetrated exam due to patient size and portable technique.  The lungs are likely hyperinflated.  Bronchitic changes without focal infiltrate, effusion, or pneumothorax.  Normal heart size. No acute osseous findings.  IMPRESSION: Bronchitic changes without evidence of pneumonia or air leak.   Original Report Authenticated By: Tiburcio Pea    EKG: Independently reviewed. Normal sinus rhythm. Cannot exclude septal infarct, age unknown. No acute changes  seen.   Principal Problem:   Acute respiratory failure with hypoxia Active Problems:   DIABETES MELLITUS, TYPE II   OBSTRUCTIVE SLEEP APNEA   COPD exacerbation   Assessment/Plan 1. Acute hypoxic/hypercapnic respiratory failure with respiratory acidosis: Secondary to COPD exacerbation. Currently appears stable on BiPAP. Admit to SDU. Given normal mentation, oxygenation and clinical status there is no indication for intubation at this point. Repeat ABG at 1100 and follow clinically. Consult pulmonology. 2. COPD exacerbation: Empiric antibiotic, nebulizers, steroids. 3. Diabetes mellitus type 2: Sliding scale insulin. Hold oral anti-hyperglycemics. 4. Obstructive sleep apnea: CPAP at night.  Code Status: Full code  DVT prophylaxis: Lovenox Family Communication: Discussed with wife at bedside Disposition Plan/Anticipated LOS: Admit. 3-4 days.  Time spent: 60 minutes  Brendia Sacks, MD  [facility] Pager [phone] 09/19/2012, 8:11 AM

## 2012-09-19 NOTE — ED Notes (Signed)
Sob worsening over past few days, denies cp.  Pt using neb tx at home with minimal results

## 2012-09-19 NOTE — Plan of Care (Signed)
Problem: ICU Phase Progression Outcomes Goal: Dyspnea controlled at rest Outcome: Progressing Off bipap and now on 3L [place] Goal: Voiding-avoid urinary catheter unless indicated Outcome: Completed/Met Date Met:  09/19/12 Using urinal

## 2012-09-19 NOTE — ED Notes (Signed)
Pt placed on 3l O2 by nasal canula. EDP in room & wanting pt on bipap.

## 2012-09-19 NOTE — ED Provider Notes (Signed)
TIME SEEN: 5:49 AM  CHIEF COMPLAINT: Shortness of breath  HPI: Patient is a 52 y.o. male with a history of hypertension, diabetes, hyperlipidemia, COPD/asthma who presents to the emergency department with one week of shortness of breath. Patient was seen by his primary care physician approximately one week ago and was started on Ceftin and prednisone. He reports itchiness of breath is progressively got worse. It is worse with exertion. He is not having any chest pain. No lower extremity swelling or tenderness. No prior history of PE or DVT. No prior history of MI. No fever or cough. Patient denies being intubated in the past for his obstructive airway disease.  ROS: See HPI Constitutional: no fever  Eyes: no drainage  ENT: no runny nose   Cardiovascular:  no chest pain  Resp: SOB  GI: no vomiting GU: no dysuria Integumentary: no rash  Allergy: no hives  Musculoskeletal: no leg swelling  Neurological: no slurred speech ROS otherwise negative  PAST MEDICAL HISTORY/PAST SURGICAL HISTORY:  Past Medical History  Diagnosis Date  . Hyperlipidemia   . Type II or unspecified type diabetes mellitus without mention of complication, not stated as uncontrolled   . Asthma     MEDICATIONS:  Prior to Admission medications   Medication Sig Start Date End Date Taking? Authorizing Provider  albuterol (PROVENTIL) 4 MG tablet Take 4 mg by mouth 2 (two) times daily.      Historical Provider, MD  glimepiride (AMARYL) 2 MG tablet Take 2 mg by mouth daily.      Historical Provider, MD  lisinopril (PRINIVIL,ZESTRIL) 20 MG tablet Take 20 mg by mouth daily.      Historical Provider, MD  metFORMIN (GLUCOPHAGE) 1000 MG tablet Take 1,000 mg by mouth 2 (two) times daily.      Historical Provider, MD  simvastatin (ZOCOR) 40 MG tablet Take 40 mg by mouth at bedtime.      Historical Provider, MD  theophylline (THEODUR) 300 MG 12 hr tablet Take 300 mg by mouth 2 (two) times daily.      Historical Provider, MD   traMADol (ULTRAM) 50 MG tablet Take 50 mg by mouth 3 (three) times daily as needed.      Historical Provider, MD  zafirlukast (ACCOLATE) 20 MG tablet Take 20 mg by mouth 2 (two) times daily.      Historical Provider, MD    ALLERGIES:  No Known Allergies  SOCIAL HISTORY:  History  Substance Use Topics  . Smoking status: Former Smoker -- 1.50 packs/day for 30 years    Types: Cigarettes    Quit date: 01/17/2010  . Smokeless tobacco: Never Used     Comment: started at age 11.   Marland Kitchen Alcohol Use: Yes     Comment: every weekend    FAMILY HISTORY: Family History  Problem Relation Age of Onset  . Asthma Sister   . Heart attack Mother     EXAM: There were no vitals taken for this visit. CONSTITUTIONAL: Alert and oriented and responds appropriately to questions. Speaking in short sentences, moderate respiratory distress HEAD: Normocephalic EYES: Conjunctivae clear, PERRL ENT: normal nose; no rhinorrhea; moist mucous membranes; pharynx without lesions noted NECK: Supple, no meningismus, no LAD  CARD: RRR; S1 and S2 appreciated; no murmurs, no clicks, no rubs, no gallops RESP: First live breathing, speaking in short phrases, moderate respiratory distress, tachypnea, decreased breath sounds bilaterally without wheezing, rhonchi or rales  ABD/GI: Normal bowel sounds; non-distended; soft, non-tender, no rebound, no guarding BACK:  The back appears normal and is non-tender to palpation, there is no CVA tenderness EXT: Normal ROM in all joints; non-tender to palpation; no edema; normal capillary refill; no cyanosis    SKIN: Normal color for age and race; warm NEURO: Moves all extremities equally PSYCH: The patient's mood and manner are appropriate. Grooming and personal hygiene are appropriate.  MEDICAL DECISION MAKING: Patient in moderate respiratory distress. He does not wear oxygen at home. His oxygen saturation here is 88% on 2 L nasal cannula. He has pursed lip breathing, his tachypnea,  speaking in short phrases only, diminished breath sounds bilaterally. Will start on BiPAP with continuous albuterol. Will give Solu-Medrol and IV magnesium. Will obtain labs, portable chest x-ray and EKG. Anticipate admission to the hospital.   Date: 09/19/2012 5:59 AM  Rate: 109  Rhythm: sinus tachycardia  QRS Axis: normal  Intervals: normal  ST/T Wave abnormalities: normal  Conduction Disutrbances: none  Narrative Interpretation: Sinus tachycardia, poor R-wave progression, no ischemic changes, no old for comparison     ED PROGRESS: ABG shows hypoxemia and CO2 retention without respiratory acidosis. Patient also has leukocytosis with left shift. Chest x-ray pending. He is much more comfortable with decreased tachypnea and respiratory effort on BiPAP.  6:43 AM  CXR shows possible opacity in the left lower lobe. Patient does have a leukocytosis with left shift which may be due to infection versus recent steroid use. We'll obtain blood cultures and cover for community-acquired pneumonia with ceftriaxone and azithromycin. Will discuss with hospitalist for admission to step down. Anticipate patient will improve quickly on BiPAP.  Layla Maw Nahara Dona, DO 09/19/12 [phone]

## 2012-09-19 NOTE — ED Provider Notes (Signed)
CRITICAL CARE Performed by: Raelyn Number   Total critical care time: 60 minutes  Critical care time was exclusive of separately billable procedures and treating other patients.  Critical care was necessary to treat or prevent imminent or life-threatening deterioration.  Critical care was time spent personally by me on the following activities: development of treatment plan with patient and/or surrogate as well as nursing, discussions with consultants, evaluation of patient's response to treatment, examination of patient, obtaining history from patient or surrogate, ordering and performing treatments and interventions, ordering and review of laboratory studies, ordering and review of radiographic studies, pulse oximetry and re-evaluation of patient's condition.  Brett Maw Jaki Steptoe, DO 09/19/12 0700

## 2012-09-19 NOTE — ED Notes (Signed)
abg done. Floor nurse called and stated will call back for report in a few minutes. Pt aware. Nad. No changes.

## 2012-09-19 NOTE — ED Notes (Signed)
Pt receiving cont neb through bipap at this time

## 2012-09-20 DIAGNOSIS — J9600 Acute respiratory failure, unspecified whether with hypoxia or hypercapnia: Secondary | ICD-10-CM | POA: Diagnosis not present

## 2012-09-20 LAB — CBC
HCT: 42.3 % (ref 39.0–52.0)
Hemoglobin: 13.7 g/dL (ref 13.0–17.0)
MCV: 93.6 fL (ref 78.0–100.0)
Platelets: 194 10*3/uL (ref 150–400)
RBC: 4.52 MIL/uL (ref 4.22–5.81)
WBC: 13.8 10*3/uL — ABNORMAL HIGH (ref 4.0–10.5)

## 2012-09-20 LAB — BASIC METABOLIC PANEL
CO2: 33 mEq/L — ABNORMAL HIGH (ref 19–32)
Chloride: 100 mEq/L (ref 96–112)
Potassium: 5.1 mEq/L (ref 3.5–5.1)
Sodium: 141 mEq/L (ref 135–145)

## 2012-09-20 LAB — THEOPHYLLINE LEVEL: Theophylline Lvl: 5.5 ug/mL — ABNORMAL LOW (ref 10.0–20.0)

## 2012-09-20 LAB — GLUCOSE, CAPILLARY
Glucose-Capillary: 302 mg/dL — ABNORMAL HIGH (ref 70–99)
Glucose-Capillary: 259 mg/dL — ABNORMAL HIGH (ref 70–99)
Glucose-Capillary: 293 mg/dL — ABNORMAL HIGH (ref 70–99)

## 2012-09-20 MED ORDER — PNEUMOCOCCAL VAC POLYVALENT 25 MCG/0.5ML IJ INJ
0.5000 mL | INJECTION | INTRAMUSCULAR | Status: DC
  Filled 2012-09-20: qty 0.5

## 2012-09-20 NOTE — Progress Notes (Signed)
NAMEWELLES, WALTHALL                ACCOUNT NO.:  [id number]  MEDICAL RECORD NO.:  [id number]  LOCATION:  A304                          FACILITY:  [facility]  PHYSICIAN:  Kingsley Callander. Ouida Sills, MD       DATE OF BIRTH:  September 16, 1960  DATE OF PROCEDURE:  09/20/2012 DATE OF DISCHARGE:                                PROGRESS NOTE   HISTORY:  Brett Curry was admitted yesterday with a COPD exacerbation. He was hypoxic initially with an ABG revealing a pH of 7.39, pCO2 50, and pO2 of 44.  He was treated with BiPAP in the emergency room.  He has not required it since that time.  He feels much better today.  He has had no fever.  He is coughing up some yellow mucus.  His chest x-ray reveals no evidence of pneumonia, but does reveal hyperinflated lungs and bronchitic changes.  His white count was 14.9 initially.  PHYSICAL EXAMINATION:  VITAL SIGNS:  Temperature 97.9, pulse 73, blood pressure 95/50, oxygen saturation 92% on 3 L by nasal cannula. LUNGS:  Mild wheezes. HEART:  Regular with no murmurs. ABDOMEN:  Obese, soft, and nontender. EXTREMITIES:  No edema.  Chronic venous insufficiency changes are stable. NEUROLOGIC:  Stable.  IMPRESSION/PLAN: 1. Chronic obstructive pulmonary disease exacerbation.  CBC today     reveals a white count of 13.8.  MET 7 reveals a CO2 of 33, which is     unchanged.  Continue Rocephin and [name].  Continue nebulizer     treatments.  Continue IV Solu-Medrol.  Check theophylline level. 2. Diabetes.  Continue sliding scale NovoLog.  His Accu-Chek was 258     this morning.  His Accu-Cheks yesterday ranged from 184-324. 3. Appreciate Pulmonary consult.  Alpha 1 antitrypsin level is     pending.     Kingsley Callander. Ouida Sills, MD     ROF/MEDQ  D:  09/20/2012  T:  09/20/2012  Job:  [id number]

## 2012-09-20 NOTE — Care Management Note (Signed)
    Page 1 of 2   09/21/2012     9:17:55 AM   CARE MANAGEMENT NOTE 09/21/2012  Patient:  Brett Curry, Brett Curry   Account Number:  [id number]  Date Initiated:  09/20/2012  Documentation initiated by:  Sharrie Rothman  Subjective/Objective Assessment:   Pt admitted from home with COPD exacerbation. Pt lives with family and will return home at discharge. Pt stated that he is independent with ADL's.Pt has cpap for home use but no O2.     Action/Plan:   Cm explained process for qualifying for home O2. Pt verbalized understanding. Will continue to monitor.   Anticipated DC Date:  09/24/2012   Anticipated DC Plan:  HOME/SELF CARE      DC Planning Services  CM consult      PAC Choice  DURABLE MEDICAL EQUIPMENT   Choice offered to / List presented to:  C-1 Patient   DME arranged  [facility]      DME agency  [facility]        Status of service:  Completed, signed off Medicare Important Message given?  YES (If response is "NO", the following Medicare IM given date fields will be blank) Date Medicare IM given:  09/21/2012 Date Additional Medicare IM given:    Discharge Disposition:  HOME/SELF CARE  Per UR Regulation:    If discussed at Long Length of Stay Meetings, dates discussed:    Comments:  09/21/12 0915 Arlyss Queen, RN BSN CM Pt discharged home today with home O2 from [place]. Portable will be delivered to pts room prior to discharge and concentrator will be delivered to pts home. Pt and pts nurse aware of discharge arrangements.  09/20/12 1455 Arlyss Queen, RN BSN CM

## 2012-09-20 NOTE — Progress Notes (Signed)
Subjective: He says he feels better. He has no new complaints.  Objective: Vital signs in last 24 hours: Temp:  [97.9 F (36.6 C)-98.4 F (36.9 C)] 97.9 F (36.6 C) (09/04 0737) Pulse Rate:  [60-131] 131 (09/04 0800) Resp:  [15-27] 27 (09/04 0800) BP: (86-134)/(46-109) 132/74 mmHg (09/04 0800) SpO2:  [90 %-98 %] 90 % (09/04 0800) FiO2 (%):  [32 %] 32 % (09/04 0144) Weight:  [172.5 kg (380 lb 4.7 oz)] 172.5 kg (380 lb 4.7 oz) (09/04 0500) Weight change: 1.433 kg (3 lb 2.6 oz) Last BM Date: 09/17/12  Intake/Output from previous day: 09/03 0701 - 09/04 0700 In: 1330 [P.O.:1080; IV Piggyback:250] Out: 3445 [Urine:3445]  PHYSICAL EXAM General appearance: alert, cooperative, mild distress and morbidly obese Resp: rhonchi RLL Cardio: regular rate and rhythm, S1, S2 normal, no murmur, click, rub or gallop GI: soft, non-tender; bowel sounds normal; no masses,  no organomegaly Extremities: extremities normal, atraumatic, no cyanosis or edema  Lab Results:    Basic Metabolic Panel:  Recent Labs  40/98/11 0557 09/20/12 0438  NA 139 141  K 4.6 5.1  CL 96 100  CO2 33* 33*  GLUCOSE 134* 222*  BUN 18 20  CREATININE 0.80 0.66  CALCIUM 10.1 9.5   Liver Function Tests: No results found for this basename: AST, ALT, ALKPHOS, BILITOT, PROT, ALBUMIN,  in the last 72 hours No results found for this basename: LIPASE, AMYLASE,  in the last 72 hours No results found for this basename: AMMONIA,  in the last 72 hours CBC:  Recent Labs  09/19/12 0557 09/20/12 0438  WBC 14.9* 13.8*  NEUTROABS 11.2*  --   HGB 14.2 13.7  HCT 43.7 42.3  MCV 93.4 93.6  PLT 196 194   Cardiac Enzymes:  Recent Labs  09/19/12 0557  TROPONINI <0.30   BNP: No results found for this basename: PROBNP,  in the last 72 hours D-Dimer: No results found for this basename: DDIMER,  in the last 72 hours CBG:  Recent Labs  09/19/12 1007 09/19/12 1131 09/19/12 1657 09/19/12 2059 09/20/12 0721   GLUCAP 202* 246* 184* 324* 258*   Hemoglobin A1C: No results found for this basename: HGBA1C,  in the last 72 hours Fasting Lipid Panel: No results found for this basename: CHOL, HDL, LDLCALC, TRIG, CHOLHDL, LDLDIRECT,  in the last 72 hours Thyroid Function Tests: No results found for this basename: TSH, T4TOTAL, FREET4, T3FREE, THYROIDAB,  in the last 72 hours Anemia Panel: No results found for this basename: VITAMINB12, FOLATE, FERRITIN, TIBC, IRON, RETICCTPCT,  in the last 72 hours Coagulation: No results found for this basename: LABPROT, INR,  in the last 72 hours Urine Drug Screen: Drugs of Abuse  No results found for this basename: labopia, cocainscrnur, labbenz, amphetmu, thcu, labbarb    Alcohol Level: No results found for this basename: ETH,  in the last 72 hours Urinalysis: No results found for this basename: COLORURINE, APPERANCEUR, LABSPEC, PHURINE, GLUCOSEU, HGBUR, BILIRUBINUR, KETONESUR, PROTEINUR, UROBILINOGEN, NITRITE, LEUKOCYTESUR,  in the last 72 hours Misc. Labs:  ABGS  Recent Labs  09/19/12 1030  PHART 7.383  PO2ART 59.5*  TCO2 26.3  HCO3 29.6*   CULTURES Recent Results (from the past 240 hour(s))  CULTURE, BLOOD (ROUTINE X 2)     Status: None   Collection Time    09/19/12  6:49 AM      Result Value Range Status   Specimen Description BLOOD RIGHT ARM   Final   Special Requests BOTTLES DRAWN  AEROBIC AND ANAEROBIC 8CC   Final   Culture NO GROWTH <24 HRS   Final   Report Status PENDING   Incomplete  CULTURE, BLOOD (ROUTINE X 2)     Status: None   Collection Time    09/19/12  6:53 AM      Result Value Range Status   Specimen Description BLOOD LEFT HAND   Final   Special Requests BOTTLES DRAWN AEROBIC AND ANAEROBIC 8CC   Final   Culture NO GROWTH <24 HRS   Final   Report Status PENDING   Incomplete  MRSA PCR SCREENING     Status: None   Collection Time    09/19/12  8:23 AM      Result Value Range Status   MRSA by PCR NEGATIVE  NEGATIVE Final    Comment:            The GeneXpert MRSA Assay (FDA     approved for NASAL specimens     only), is one component of a     comprehensive MRSA colonization     surveillance program. It is not     intended to diagnose MRSA     infection nor to guide or     monitor treatment for     MRSA infections.   Studies/Results: Dg Chest Portable 1 View  09/19/2012   *RADIOLOGY REPORT*  Clinical Data: Shortness of breath.  History of asthma.  PORTABLE CHEST - 1 VIEW  Comparison: 02/24/2010.  Findings: Under penetrated exam due to patient size and portable technique.  The lungs are likely hyperinflated.  Bronchitic changes without focal infiltrate, effusion, or pneumothorax.  Normal heart size. No acute osseous findings.  IMPRESSION: Bronchitic changes without evidence of pneumonia or air leak.   Original Report Authenticated By: Tiburcio Pea    Medications:  Scheduled: . ipratropium  0.5 mg Nebulization Q6H   And  . albuterol  2.5 mg Nebulization Q6H  . azithromycin  500 mg Intravenous Q24H  . cefTRIAXone (ROCEPHIN)  IV  1 g Intravenous Q24H  . enoxaparin (LOVENOX) injection  40 mg Subcutaneous Daily  . insulin aspart  0-9 Units Subcutaneous TID WC  . lisinopril  20 mg Oral Daily  . methylPREDNISolone sodium succinate  60 mg Intravenous Q6H  . montelukast  10 mg Oral QHS  . simvastatin  40 mg Oral QHS  . sodium chloride  3 mL Intravenous Q12H  . theophylline  300 mg Oral BID   Continuous: . albuterol 15 mg/hr (09/19/12 0612)   ZOX:WRUEAV chloride, acetaminophen, acetaminophen, albuterol, morphine injection, ondansetron (ZOFRAN) IV, ondansetron, sodium chloride  Assesment: He was admitted with COPD exacerbation and acute respiratory failure. I think he probably has some element of obesity hypoventilation and he does have obstructive sleep apnea. He is clearly improved Principal Problem:   Acute respiratory failure with hypoxia Active Problems:   DIABETES MELLITUS, TYPE II   OBSTRUCTIVE SLEEP  APNEA   COPD exacerbation    Plan: Continue current treatments    LOS: 1 day   Yalena Colon L 09/20/2012, 8:45 AM

## 2012-09-20 NOTE — Progress Notes (Signed)
Inpatient Diabetes Program Recommendations  AACE/ADA: New Consensus Statement on Inpatient Glycemic Control (2013)  Target Ranges:  Prepandial:   less than 140 mg/dL      Peak postprandial:   less than 180 mg/dL (1-2 hours)      Critically ill patients:  140 - 180 mg/dL   Results for KDYN, VONBEHREN (MRN [id number]) as of 09/20/2012 08:34  Ref. Range 09/19/2012 10:07 09/19/2012 11:31 09/19/2012 16:57 09/19/2012 20:59 09/20/2012 07:21  Glucose-Capillary Latest Range: 70-99 mg/dL 119 (H) 147 (H) 829 (H) 324 (H) 258 (H)    Inpatient Diabetes Program Recommendations Correction (SSI): Please consider increasing Novolog correction to Resistant scale while inpatient and on steroids. Also, please add Novolog bedtime correction scale.  Note: Patient has a history of diabetes and takes Amaryl 4 mg QAM, Metformin 1000 mg BID, and Actos 15 mg daily at home for diabetes management.  Currently, patient is ordered to receive Novolog 0-9 units AC for inpatient glycemic control.  Currently patient is on Solumedrol 60 mg Q6H which is contributing to hyperglycemia.  Please increase Novolog correction to resistant scale and add bedtime correction. Will continue to follow.  Thanks, Orlando Penner, RN, MSN, CCRN Diabetes Coordinator Inpatient Diabetes Program [phone]

## 2012-09-20 NOTE — Progress Notes (Signed)
Patient decided not to wear BIPAP tonight. RRT offered patient a CPAP machine but he stated that he was fine without it. Patient told to notify RRT if he changed his mind and a machine would be provided for him.

## 2012-09-21 LAB — GLUCOSE, CAPILLARY: Glucose-Capillary: 291 mg/dL — ABNORMAL HIGH (ref 70–99)

## 2012-09-21 LAB — ALPHA-1-ANTITRYPSIN: A-1 Antitrypsin, Ser: 220 mg/dL — ABNORMAL HIGH (ref 90–200)

## 2012-09-21 MED ORDER — LEVOFLOXACIN 500 MG PO TABS: 500.0000 mg | ORAL_TABLET | Freq: Every day | ORAL | Status: DC

## 2012-09-21 NOTE — Progress Notes (Signed)
Patients room air sat is 88% at rest.

## 2012-09-21 NOTE — Progress Notes (Signed)
Patient received discharge instructions along with follow up appointments and prescriptions. Patient verbalized understanding of all instructions. Patient was escorted by staff via wheelchair to vehicle. Patient discharged to home in stable condition. 

## 2012-09-21 NOTE — Discharge Summary (Signed)
[name], Brett Curry                ACCOUNT NO.:  [id number]  MEDICAL RECORD NO.:  [id number]  LOCATION:  A304                          FACILITY:  [facility]  PHYSICIAN:  Kingsley Callander. Ouida Sills, MD       DATE OF BIRTH:  05-29-1960  DATE OF ADMISSION:  09/19/2012 DATE OF DISCHARGE:  09/05/2014LH                              DISCHARGE SUMMARY   DISCHARGE DIAGNOSES: 1. Acute respiratory failure. 2. Chronic obstructive pulmonary disease exacerbation. 3. Hypoxia. 4. Obstructive sleep apnea. 5. Type 2 diabetes. 6. Morbid obesity. 7. Hyperlipidemia. 8. Hypertension.  DISCHARGE MEDICATIONS:  Levaquin 500 mg daily for 7 days; prednisone 10 mg four a day for 3 days, three a day for 3 days, two a day for 3 days, 1 a day for 3 days; DuoNebs q.4-6 hours; Amaryl 4 mg daily; albuterol 4 mg b.i.d.; lisinopril 20 mg daily; metformin 1000 mg b.i.d.; Actos 15 mg daily; pravastatin 40 mg daily; Theo-Dur 300 mg b.i.d.; tramadol 100 mg at bedtime p.r.n.; Accolate 20 mg b.i.d.; and oxygen 3 L by nasal cannula.  HOSPITAL COURSE:  This patient is a 52 year old male with asthma, COPD, and sleep apnea, who presented with acute worsening of a recent COPD exacerbation.  He was coughing and congested.  He was short of breath. He was hypoxic with initial ABG revealing a pH of 7.39, pCO2 of 50, pO2 of 44.  He was treated with BiPAP, oxygenation improved.  He is modified to a nasal cannula and maintained adequate oxygen saturations following that.  He was treated with Rocephin, [name], Solu-Medrol, nebulizer treatments, and theophylline.  His theophylline level was 5.5.  His chest x-ray revealed no acute infiltrate.  Blood cultures have thus far been negative.  He was seen in Pulmonary consultation by Dr. Juanetta Gosling and alpha-1 antitrypsin was checked but remains pending.  His white count initially was 14.9 with a repeat of 13.8.  His hemoglobin A1c was 7.3.  His respiratory status gradually improved.  Wheezing and  shortness of breath resolved.  He remained afebrile.  He was improved and stable for discharge on September 21, 2012.  He will be seen in followup in my office in 1 week.  Diabetes has been treated with sliding scale NovoLog.  He will resume Amaryl and metformin.  He will again require oxygen on outpatient basis.  He will continue oxygen at 3 L by nasal cannula.  He will continue CPAP for his sleep apnea.  His condition at discharge is much improved.     Kingsley Callander. Ouida Sills, MD     ROF/MEDQ  D:  09/21/2012  T:  09/21/2012  Job:  [id number]

## 2012-09-24 LAB — CULTURE, BLOOD (ROUTINE X 2): Culture: NO GROWTH

## 2012-12-18 DIAGNOSIS — I10 Essential (primary) hypertension: Secondary | ICD-10-CM | POA: Diagnosis not present

## 2012-12-18 DIAGNOSIS — E119 Type 2 diabetes mellitus without complications: Secondary | ICD-10-CM | POA: Diagnosis not present

## 2013-04-26 DIAGNOSIS — E1149 Type 2 diabetes mellitus with other diabetic neurological complication: Secondary | ICD-10-CM | POA: Diagnosis not present

## 2013-04-26 DIAGNOSIS — I10 Essential (primary) hypertension: Secondary | ICD-10-CM | POA: Diagnosis not present

## 2013-06-11 DIAGNOSIS — J209 Acute bronchitis, unspecified: Secondary | ICD-10-CM | POA: Diagnosis not present

## 2013-08-26 ENCOUNTER — Telehealth: Payer: Self-pay | Admitting: Pulmonary Disease

## 2013-08-26 DIAGNOSIS — G4733 Obstructive sleep apnea (adult) (pediatric): Secondary | ICD-10-CM

## 2013-08-26 NOTE — Telephone Encounter (Signed)
Pt last seen 04/22/10 by [facility] for sleep. Called spoke with pt. He reports his machine stopped working last night and wants an order sent for a new one. [facility]. Pt reports he can't go that long without his machine. Please advise thanks

## 2013-08-26 NOTE — Telephone Encounter (Signed)
Ok to send order for machine, but the pt MUST keep consult apptm with me.   Send in :  resmed s10 air/auto with h/h and climate control tubing.  Keep on same setting.  Enroll in [place].

## 2013-08-26 NOTE — Telephone Encounter (Signed)
Spoke with the pt and [facility] He verbalized understanding  [facility]

## 2013-08-27 ENCOUNTER — Telehealth: Payer: Self-pay | Admitting: Pulmonary Disease

## 2013-08-27 NOTE — Telephone Encounter (Signed)
lmtcb x1 

## 2013-08-28 NOTE — Telephone Encounter (Signed)
I spoke with the pt and have [facility] for [facility] so we can send note to DME so he can get new cpap. Carron CurieJennifer Luca Burston, CMA

## 2013-08-30 ENCOUNTER — Encounter: Payer: Self-pay | Admitting: Pulmonary Disease

## 2013-08-30 ENCOUNTER — Ambulatory Visit (INDEPENDENT_AMBULATORY_CARE_PROVIDER_SITE_OTHER): Payer: Self-pay | Admitting: Pulmonary Disease

## 2013-08-30 VITALS — BP 92/58 | HR 95 | Temp 98.5°F | Ht 72.0 in | Wt 391.4 lb

## 2013-08-30 DIAGNOSIS — G4733 Obstructive sleep apnea (adult) (pediatric): Secondary | ICD-10-CM

## 2013-08-30 NOTE — Patient Instructions (Signed)
Continue with your new cpap device, and keep up with mask changes and supplies. Work on weight loss followup with me again in one year.

## 2013-08-30 NOTE — Assessment & Plan Note (Signed)
The patient has done well with CPAP over the years, but has not kept up followup visits. His old machine recently quit functioning, and we have gotten him a new device set on the automatic mode. He feels that he is doing very well with this, with adequate sleep and daytime alertness. I have asked him to keep up with his mask changes and supplies, and to work aggressively on weight loss. He is to followup with me again in one year.

## 2013-08-30 NOTE — Progress Notes (Signed)
Subjective:    Patient ID: Brett Curry, male    DOB: [birth year], 53 y.o.   MRN: [id number]  HPI The patient is a 53 year old male who I've been asked to see for management of obstructive sleep apnea. He was diagnosed with very severe OSA in 2011, with an AHI of 90 events per hour. He was started on CPAP, but has not been seen since. His CPAP machine quit functioning, and we have recently ordered him a new one. He is doing very well with this, and currently is using a full face mask that is about 19 months old. When his prior machine work properly, he had no issues with awakening or nonrestorative sleep. He felt like he was sleeping well, and was adequately rested during the day. He continues to do well with his new CPAP device on the automatic setting, and feels that he is getting enough air.   Sleep Questionnaire What time do you typically go to bed?( Between what hours) 9-10pm 9-10pm at 1358 on 08/30/13 by Darrell Jewel, CMA How long does it take you to fall asleep? 5 mins 5 mins at 1358 on 08/30/13 by Darrell Jewel, CMA How many times during the night do you wake up? 3 3 at 1358 on 08/30/13 by Darrell Jewel, CMA What time do you get out of bed to start your day? 0600 0600 at 1358 on 08/30/13 by Darrell Jewel, CMA Do you drive or operate heavy machinery in your occupation? No No at 1358 on 08/30/13 by Darrell Jewel, CMA How much has your weight changed (up or down) over the past two years? (In pounds) 0 oz (0 kg) 0 oz (0 kg) at 1358 on 08/30/13 by Darrell Jewel, CMA Have you ever had a sleep study before? Yes Yes at 1358 on 08/30/13 by Darrell Jewel, CMA If yes, location of study? [facility] at 1358 on 08/30/13 by Darrell Jewel, CMA If yes, date of study? Do you currently use CPAP? Yes Yes at 1358 on 08/30/13 by Darrell Jewel, CMA If so, what pressure? 16 16 at 1358 on 08/30/13 by Darrell Jewel, CMA  Do you wear oxygen at any time? No    Review of Systems  Constitutional: Negative for fever and unexpected weight change.  HENT: Negative for congestion, dental problem, ear pain, nosebleeds, postnasal drip, rhinorrhea, sinus pressure, sneezing, sore throat and trouble swallowing.   Eyes: Negative for redness and itching.  Respiratory: Positive for shortness of breath. Negative for cough, chest tightness and wheezing.   Cardiovascular: Negative for palpitations and leg swelling.  Gastrointestinal: Negative for nausea and vomiting.  Genitourinary: Negative for dysuria.  Musculoskeletal: Negative for joint swelling.  Skin: Negative for rash.  Neurological: Negative for headaches.  Hematological: Does not bruise/bleed easily.  Psychiatric/Behavioral: Negative for dysphoric mood. The patient is not nervous/anxious.        Objective:   Physical Exam Constitutional:  Morbidly obese male , no acute distress  HENT:  Nares patent without discharge, but narrowed bilat.  Oropharynx without exudate, palate and uvula are very thick and elongated.   Eyes:  Perrla, eomi, no scleral icterus  Neck:  No JVD, no TMG  Cardiovascular:  Normal rate, regular rhythm, no rubs or gallops.  No murmurs        Intact distal pulses but diminished.  Pulmonary :  Normal breath sounds, no stridor or respiratory distress   No rales, rhonchi, or wheezing  Abdominal:  Soft, nondistended, bowel sounds present.  No tenderness noted.   Musculoskeletal:  3+ lower extremity edema noted.  Lymph Nodes:  No cervical lymphadenopathy noted  Skin:  No cyanosis noted  Neurologic:  Alert, appropriate, moves all 4 extremities without obvious deficit.         Assessment & Plan:

## 2013-09-26 ENCOUNTER — Telehealth: Payer: Self-pay | Admitting: Pulmonary Disease

## 2013-09-26 NOTE — Telephone Encounter (Signed)
Called spoke w/ pt. He reports he wants a new CPAP. He wants RX sent to [facility]. Per pt, the current machine he paid out of pocket. We placed order 08/26/13 for this. I will re fax this over. Nothing further needed

## 2013-10-18 ENCOUNTER — Institutional Professional Consult (permissible substitution): Payer: BC Managed Care – PPO | Admitting: Pulmonary Disease

## 2013-10-21 DIAGNOSIS — J441 Chronic obstructive pulmonary disease with (acute) exacerbation: Secondary | ICD-10-CM | POA: Diagnosis not present

## 2013-10-21 DIAGNOSIS — I10 Essential (primary) hypertension: Secondary | ICD-10-CM | POA: Diagnosis not present

## 2013-10-21 DIAGNOSIS — E119 Type 2 diabetes mellitus without complications: Secondary | ICD-10-CM | POA: Diagnosis not present

## 2014-01-22 DIAGNOSIS — J440 Chronic obstructive pulmonary disease with acute lower respiratory infection: Secondary | ICD-10-CM | POA: Diagnosis not present

## 2014-03-31 DIAGNOSIS — J449 Chronic obstructive pulmonary disease, unspecified: Secondary | ICD-10-CM | POA: Diagnosis not present

## 2014-03-31 DIAGNOSIS — Z79899 Other long term (current) drug therapy: Secondary | ICD-10-CM | POA: Diagnosis not present

## 2014-03-31 DIAGNOSIS — E119 Type 2 diabetes mellitus without complications: Secondary | ICD-10-CM | POA: Diagnosis not present

## 2014-03-31 DIAGNOSIS — M1990 Unspecified osteoarthritis, unspecified site: Secondary | ICD-10-CM | POA: Diagnosis not present

## 2014-03-31 DIAGNOSIS — I10 Essential (primary) hypertension: Secondary | ICD-10-CM | POA: Diagnosis not present

## 2014-03-31 DIAGNOSIS — E785 Hyperlipidemia, unspecified: Secondary | ICD-10-CM | POA: Diagnosis not present

## 2014-04-08 DIAGNOSIS — E119 Type 2 diabetes mellitus without complications: Secondary | ICD-10-CM | POA: Diagnosis not present

## 2014-04-08 DIAGNOSIS — E785 Hyperlipidemia, unspecified: Secondary | ICD-10-CM | POA: Diagnosis not present

## 2014-07-01 DIAGNOSIS — L02421 Furuncle of right axilla: Secondary | ICD-10-CM | POA: Diagnosis not present

## 2014-07-16 DIAGNOSIS — M25562 Pain in left knee: Secondary | ICD-10-CM | POA: Diagnosis not present

## 2014-07-16 DIAGNOSIS — M25561 Pain in right knee: Secondary | ICD-10-CM | POA: Diagnosis not present

## 2014-07-16 DIAGNOSIS — E119 Type 2 diabetes mellitus without complications: Secondary | ICD-10-CM | POA: Diagnosis not present

## 2014-07-16 DIAGNOSIS — G8929 Other chronic pain: Secondary | ICD-10-CM | POA: Diagnosis not present

## 2014-07-16 DIAGNOSIS — M170 Bilateral primary osteoarthritis of knee: Secondary | ICD-10-CM | POA: Diagnosis not present

## 2014-07-16 DIAGNOSIS — Z6841 Body Mass Index (BMI) 40.0 and over, adult: Secondary | ICD-10-CM | POA: Diagnosis not present

## 2014-07-27 ENCOUNTER — Encounter (HOSPITAL_COMMUNITY): Payer: Self-pay | Admitting: Emergency Medicine

## 2014-07-27 ENCOUNTER — Inpatient Hospital Stay (HOSPITAL_COMMUNITY)
Admission: EM | Admit: 2014-07-27 | Discharge: 2014-07-29 | DRG: 190 | Disposition: A | Discharge status: Home / Self Care | Payer: Medicare Other | Attending: Internal Medicine | Admitting: Internal Medicine

## 2014-07-27 ENCOUNTER — Emergency Department (HOSPITAL_COMMUNITY): Payer: Medicare Other

## 2014-07-27 DIAGNOSIS — E785 Hyperlipidemia, unspecified: Secondary | ICD-10-CM | POA: Diagnosis present

## 2014-07-27 DIAGNOSIS — Z8249 Family history of ischemic heart disease and other diseases of the circulatory system: Secondary | ICD-10-CM

## 2014-07-27 DIAGNOSIS — E119 Type 2 diabetes mellitus without complications: Secondary | ICD-10-CM | POA: Diagnosis present

## 2014-07-27 DIAGNOSIS — Z6841 Body Mass Index (BMI) 40.0 and over, adult: Secondary | ICD-10-CM

## 2014-07-27 DIAGNOSIS — Z794 Long term (current) use of insulin: Secondary | ICD-10-CM

## 2014-07-27 DIAGNOSIS — G4733 Obstructive sleep apnea (adult) (pediatric): Secondary | ICD-10-CM | POA: Diagnosis not present

## 2014-07-27 DIAGNOSIS — R0602 Shortness of breath: Secondary | ICD-10-CM | POA: Diagnosis present

## 2014-07-27 DIAGNOSIS — Z825 Family history of asthma and other chronic lower respiratory diseases: Secondary | ICD-10-CM

## 2014-07-27 DIAGNOSIS — R0789 Other chest pain: Secondary | ICD-10-CM | POA: Diagnosis not present

## 2014-07-27 DIAGNOSIS — J441 Chronic obstructive pulmonary disease with (acute) exacerbation: Principal | ICD-10-CM

## 2014-07-27 DIAGNOSIS — E1165 Type 2 diabetes mellitus with hyperglycemia: Secondary | ICD-10-CM

## 2014-07-27 DIAGNOSIS — J45909 Unspecified asthma, uncomplicated: Secondary | ICD-10-CM | POA: Diagnosis present

## 2014-07-27 DIAGNOSIS — J9601 Acute respiratory failure with hypoxia: Secondary | ICD-10-CM

## 2014-07-27 DIAGNOSIS — J9602 Acute respiratory failure with hypercapnia: Secondary | ICD-10-CM | POA: Diagnosis present

## 2014-07-27 DIAGNOSIS — Z87891 Personal history of nicotine dependence: Secondary | ICD-10-CM | POA: Diagnosis not present

## 2014-07-27 HISTORY — DX: Morbid (severe) obesity due to excess calories: E66.01

## 2014-07-27 HISTORY — DX: Chronic obstructive pulmonary disease, unspecified: J44.9

## 2014-07-27 LAB — BASIC METABOLIC PANEL
Anion gap: 9 (ref 5–15)
BUN: 16 mg/dL (ref 6–20)
CALCIUM: 9.2 mg/dL (ref 8.9–10.3)
CHLORIDE: 101 mmol/L (ref 101–111)
CO2: 27 mmol/L (ref 22–32)
CREATININE: 0.78 mg/dL (ref 0.61–1.24)
GFR calc Af Amer: >60 mL/min (ref >60)
GFR calc non Af Amer: >60 mL/min (ref >60)
Glucose, Bld: 260 mg/dL — ABNORMAL HIGH (ref 65–99)
Potassium: 4.9 mmol/L (ref 3.5–5.1)
Sodium: 137 mmol/L (ref 135–145)

## 2014-07-27 LAB — GLUCOSE, CAPILLARY: Glucose-Capillary: 229 mg/dL — ABNORMAL HIGH (ref 65–99)

## 2014-07-27 LAB — BRAIN NATRIURETIC PEPTIDE: B NATRIURETIC PEPTIDE 5: 34 pg/mL (ref 0.0–100.0)

## 2014-07-27 LAB — CBC WITH DIFFERENTIAL/PLATELET
BASOS ABS: 0 10*3/uL (ref 0.0–0.1)
Basophils Relative: 0 % (ref 0–1)
EOS PCT: 0 % (ref 0–5)
Eosinophils Absolute: 0 10*3/uL (ref 0.0–0.7)
HEMATOCRIT: 40.8 % (ref 39.0–52.0)
Hemoglobin: 13 g/dL (ref 13.0–17.0)
LYMPHS PCT: 9 % — AB (ref 12–46)
Lymphs Abs: 0.9 10*3/uL (ref 0.7–4.0)
MCH: 29.1 pg (ref 26.0–34.0)
MCHC: 31.9 g/dL (ref 30.0–36.0)
MCV: 91.3 fL (ref 78.0–100.0)
MONO ABS: 0.3 10*3/uL (ref 0.1–1.0)
Monocytes Relative: 3 % (ref 3–12)
Neutro Abs: 8.5 10*3/uL — ABNORMAL HIGH (ref 1.7–7.7)
Neutrophils Relative %: 88 % — ABNORMAL HIGH (ref 43–77)
Platelets: 207 10*3/uL (ref 150–400)
RBC: 4.47 MIL/uL (ref 4.22–5.81)
RDW: 15.4 % (ref 11.5–15.5)
WBC: 9.7 10*3/uL (ref 4.0–10.5)

## 2014-07-27 LAB — BLOOD GAS, ARTERIAL
Acid-Base Excess: 3 mmol/L — ABNORMAL HIGH (ref 0.0–2.0)
BICARBONATE: 27.6 meq/L — AB (ref 20.0–24.0)
Drawn by: 22179
O2 Content: 2 L/min
O2 SAT: 92 %
PATIENT TEMPERATURE: 37
PO2 ART: 76.4 mmHg — AB (ref 80.0–100.0)
TCO2: 24.7 mmol/L (ref 0–100)
pCO2 arterial: 46.6 mmHg — ABNORMAL HIGH (ref 35.0–45.0)
pH, Arterial: 7.39 (ref 7.350–7.450)

## 2014-07-27 LAB — TROPONIN I: Troponin I: <0.03 ng/mL (ref <0.031)

## 2014-07-27 LAB — TSH: TSH: 1.123 u[IU]/mL (ref 0.350–4.500)

## 2014-07-27 MED ORDER — METHYLPREDNISOLONE SODIUM SUCC 125 MG IJ SOLR
125.0000 mg | Freq: Once | INTRAMUSCULAR | Status: AC
  Administered 2014-07-27: 125 mg via INTRAVENOUS
  Filled 2014-07-27: qty 2

## 2014-07-27 MED ORDER — ALBUTEROL SULFATE (2.5 MG/3ML) 0.083% IN NEBU: 2.5000 mg | INHALATION_SOLUTION | RESPIRATORY_TRACT | Status: DC | PRN

## 2014-07-27 MED ORDER — PIOGLITAZONE HCL 15 MG PO TABS
30.0000 mg | ORAL_TABLET | Freq: Every day | ORAL | Status: DC
  Administered 2014-07-28 – 2014-07-29 (×2): 30 mg via ORAL
  Filled 2014-07-27 (×2): qty 1
  Filled 2014-07-27: qty 2

## 2014-07-27 MED ORDER — IPRATROPIUM BROMIDE 0.02 % IN SOLN
0.5000 mg | Freq: Once | RESPIRATORY_TRACT | Status: AC
  Administered 2014-07-27: 0.5 mg via RESPIRATORY_TRACT
  Filled 2014-07-27: qty 2.5

## 2014-07-27 MED ORDER — INSULIN ASPART 100 UNIT/ML ~~LOC~~ SOLN
0.0000 [IU] | Freq: Three times a day (TID) | SUBCUTANEOUS | Status: DC
Start: 2014-07-28 — End: 2014-07-29
  Administered 2014-07-28: 7 [IU] via SUBCUTANEOUS
  Administered 2014-07-28: 11 [IU] via SUBCUTANEOUS
  Administered 2014-07-28: 7 [IU] via SUBCUTANEOUS
  Administered 2014-07-29: 11 [IU] via SUBCUTANEOUS

## 2014-07-27 MED ORDER — LEVOFLOXACIN IN D5W 500 MG/100ML IV SOLN: 500.0000 mg | INTRAVENOUS | Status: DC

## 2014-07-27 MED ORDER — TRAMADOL HCL 50 MG PO TABS
50.0000 mg | ORAL_TABLET | Freq: Four times a day (QID) | ORAL | Status: DC | PRN
  Administered 2014-07-28: 100 mg via ORAL
  Administered 2014-07-28: 50 mg via ORAL
  Filled 2014-07-27: qty 2
  Filled 2014-07-27: qty 1

## 2014-07-27 MED ORDER — METHYLPREDNISOLONE SODIUM SUCC 125 MG IJ SOLR
125.0000 mg | Freq: Two times a day (BID) | INTRAMUSCULAR | Status: DC
  Administered 2014-07-27 – 2014-07-28 (×3): 125 mg via INTRAVENOUS
  Filled 2014-07-27 (×3): qty 2

## 2014-07-27 MED ORDER — LISINOPRIL 10 MG PO TABS
20.0000 mg | ORAL_TABLET | Freq: Every day | ORAL | Status: DC
  Administered 2014-07-28 – 2014-07-29 (×2): 20 mg via ORAL
  Filled 2014-07-27 (×3): qty 2

## 2014-07-27 MED ORDER — ENOXAPARIN SODIUM 40 MG/0.4ML ~~LOC~~ SOLN
40.0000 mg | SUBCUTANEOUS | Status: DC
  Administered 2014-07-27: 40 mg via SUBCUTANEOUS
  Filled 2014-07-27: qty 0.4

## 2014-07-27 MED ORDER — ONDANSETRON HCL 4 MG/2ML IJ SOLN: 4.0000 mg | Freq: Four times a day (QID) | INTRAMUSCULAR | Status: DC | PRN

## 2014-07-27 MED ORDER — THEOPHYLLINE ER 300 MG PO TB12
300.0000 mg | ORAL_TABLET | Freq: Two times a day (BID) | ORAL | Status: DC
  Administered 2014-07-28 – 2014-07-29 (×3): 300 mg via ORAL
  Filled 2014-07-27 (×4): qty 1

## 2014-07-27 MED ORDER — METFORMIN HCL 500 MG PO TABS
1000.0000 mg | ORAL_TABLET | Freq: Two times a day (BID) | ORAL | Status: DC
  Administered 2014-07-28 – 2014-07-29 (×3): 1000 mg via ORAL
  Filled 2014-07-27 (×3): qty 2

## 2014-07-27 MED ORDER — METFORMIN HCL 500 MG PO TABS: 1000.0000 mg | ORAL_TABLET | Freq: Two times a day (BID) | ORAL | Status: DC

## 2014-07-27 MED ORDER — IPRATROPIUM-ALBUTEROL 0.5-2.5 (3) MG/3ML IN SOLN: 3.0000 mL | Freq: Once | RESPIRATORY_TRACT | Status: DC

## 2014-07-27 MED ORDER — ONDANSETRON HCL 4 MG PO TABS: 4.0000 mg | ORAL_TABLET | Freq: Four times a day (QID) | ORAL | Status: DC | PRN

## 2014-07-27 MED ORDER — GLIMEPIRIDE 2 MG PO TABS
4.0000 mg | ORAL_TABLET | Freq: Every day | ORAL | Status: DC
  Administered 2014-07-28 – 2014-07-29 (×2): 4 mg via ORAL
  Filled 2014-07-27: qty 2
  Filled 2014-07-27 (×2): qty 1
  Filled 2014-07-27: qty 2

## 2014-07-27 MED ORDER — LEVOFLOXACIN IN D5W 500 MG/100ML IV SOLN
INTRAVENOUS | Status: AC
  Administered 2014-07-27: 20:00:00
  Filled 2014-07-27: qty 100

## 2014-07-27 MED ORDER — PRAVASTATIN SODIUM 40 MG PO TABS
40.0000 mg | ORAL_TABLET | Freq: Every day | ORAL | Status: DC
  Administered 2014-07-28: 40 mg via ORAL
  Filled 2014-07-27 (×4): qty 1

## 2014-07-27 MED ORDER — LEVOFLOXACIN IN D5W 500 MG/100ML IV SOLN
500.0000 mg | Freq: Once | INTRAVENOUS | Status: AC
  Administered 2014-07-27: 500 mg via INTRAVENOUS
  Filled 2014-07-27: qty 100

## 2014-07-27 MED ORDER — IPRATROPIUM-ALBUTEROL 0.5-2.5 (3) MG/3ML IN SOLN
3.0000 mL | Freq: Once | RESPIRATORY_TRACT | Status: AC
  Administered 2014-07-27: 3 mL via RESPIRATORY_TRACT
  Filled 2014-07-27: qty 3

## 2014-07-27 MED ORDER — ALBUTEROL (5 MG/ML) CONTINUOUS INHALATION SOLN
15.0000 mg/h | INHALATION_SOLUTION | Freq: Once | RESPIRATORY_TRACT | Status: AC
  Administered 2014-07-27: 15 mg/h via RESPIRATORY_TRACT
  Filled 2014-07-27: qty 20

## 2014-07-27 MED ORDER — INSULIN ASPART 100 UNIT/ML ~~LOC~~ SOLN
0.0000 [IU] | Freq: Every day | SUBCUTANEOUS | Status: DC
  Administered 2014-07-28: 5 [IU] via SUBCUTANEOUS

## 2014-07-27 MED ORDER — LEVOFLOXACIN IN D5W 500 MG/100ML IV SOLN
500.0000 mg | INTRAVENOUS | Status: DC
  Administered 2014-07-28: 500 mg via INTRAVENOUS
  Filled 2014-07-27: qty 100

## 2014-07-27 MED ORDER — SODIUM CHLORIDE 0.9 % IV SOLN
INTRAVENOUS | Status: DC
  Administered 2014-07-27: 1000 mL via INTRAVENOUS

## 2014-07-27 NOTE — ED Notes (Signed)
Patient c/o shortness of breath that started last night. Labored breathing noted. Per patient cough with small amount of thick white sputum. Patient reports "slight chest pain on right side" Per patient started after  Shortness of breath. Patient reports it is a slight burning sensation. Patient has hx of COPD/asthma. Patient reports using nebulizer last night with no relief.

## 2014-07-27 NOTE — ED Provider Notes (Signed)
CSN: [id number]     Arrival date & time 07/27/14  1649 History   First MD Initiated Contact with Patient 07/27/14 1702     Chief Complaint  Patient presents with  . Shortness of Breath     (Consider location/radiation/quality/duration/timing/severity/associated sxs/prior Treatment) HPI Comments: Patient with history of asthma, COPD, sleep apnea presenting with shortness of breath, coughing and wheezing that onset last night. Reports using albuterol 3 times a day and nebulizer once. States cough is productive of white sputum. He has a few seconds of right-sided chest pain that comes and goes and is worse with coughing. He reports a burning sensation in his right chest. Denies any cardiac history. Denies ever having a heart attack. He is a former smoker. He denies any fever at home. Denies abdominal pain, nausea or vomiting. He also has bilateral erythema and pain to his lower legs for 2 weeks. Denies any falls or injuries.  The history is provided by the patient and a relative. The history is limited by the condition of the patient.    Past Medical History  Diagnosis Date  . Hyperlipidemia   . Type II or unspecified type diabetes mellitus without mention of complication, not stated as uncontrolled   . Asthma   . COPD (chronic obstructive pulmonary disease)   . Morbid obesity due to excess calories 07/27/2014   Past Surgical History  Procedure Laterality Date  . Shoulder surgery      right  . Knee surgery      right  . Tonsillectomy     Family History  Problem Relation Age of Onset  . Asthma Sister   . Heart attack Mother    History  Substance Use Topics  . Smoking status: Former Smoker -- 1.50 packs/day for 30 years    Types: Cigarettes    Quit date: 01/17/2010  . Smokeless tobacco: Never Used     Comment: started at age 89.   Marland Kitchen Alcohol Use: No    Review of Systems  Constitutional: Positive for activity change and appetite change. Negative for fever and fatigue.  HENT:  Negative for congestion and rhinorrhea.   Respiratory: Positive for cough, chest tightness and shortness of breath.   Cardiovascular: Positive for leg swelling. Negative for chest pain.  Gastrointestinal: Negative for nausea, vomiting and abdominal pain.  Genitourinary: Negative for dysuria, hematuria and testicular pain.  Musculoskeletal: Negative for myalgias and arthralgias.  Skin: Negative for rash.  Neurological: Negative for dizziness, weakness and headaches.  A complete 10 system review of systems was obtained and all systems are negative except as noted in the HPI and PMH.      Allergies  Review of patient's allergies indicates no known allergies.  Home Medications   Prior to Admission medications   Medication Sig Start Date End Date Taking? Authorizing Provider  albuterol (PROAIR HFA) 108 (90 BASE) MCG/ACT inhaler Inhale 2 puffs into the lungs every 6 (six) hours as needed for wheezing or shortness of breath.   Yes Historical Provider, MD  budesonide-formoterol (SYMBICORT) 160-4.5 MCG/ACT inhaler Inhale 2 puffs into the lungs 2 (two) times daily.   Yes Historical Provider, MD  glimepiride (AMARYL) 4 MG tablet Take 4 mg by mouth daily before breakfast.   Yes Historical Provider, MD  lisinopril (PRINIVIL,ZESTRIL) 20 MG tablet Take 20 mg by mouth daily.     Yes Historical Provider, MD  metFORMIN (GLUCOPHAGE) 1000 MG tablet Take 1,000 mg by mouth 2 (two) times daily.     Yes  Historical Provider, MD  pioglitazone (ACTOS) 15 MG tablet Take 30 mg by mouth daily.    Yes Historical Provider, MD  pravastatin (PRAVACHOL) 40 MG tablet Take 1 tablet by mouth at bedtime.  09/08/12  Yes Historical Provider, MD  theophylline (THEODUR) 300 MG 12 hr tablet Take 300 mg by mouth 2 (two) times daily.     Yes Historical Provider, MD  traMADol (ULTRAM) 50 MG tablet Take 50-100 mg by mouth 4 (four) times daily as needed for moderate pain or severe pain.    Yes Historical Provider, MD  doxycycline  (VIBRA-TABS) 100 MG tablet Take 100 mg by mouth 2 (two) times daily. 07/01/14   Historical Provider, MD   BP 136/76 mmHg  Pulse 102  Temp(Src) 98.5 F (36.9 C) (Oral)  Resp 17  Ht 6' (1.829 m)  Wt 430 lb (195.047 kg)  BMI 58.31 kg/m2  SpO2 93% Physical Exam  Constitutional: He is oriented to person, place, and time. He appears well-developed and well-nourished. He appears distressed.  Tachypnea with increased work of breathing  HENT:  Head: Normocephalic and atraumatic.  Mouth/Throat: Oropharynx is clear and moist. No oropharyngeal exudate.  Eyes: Conjunctivae and EOM are normal. Pupils are equal, round, and reactive to light.  Neck: Normal range of motion. Neck supple.  No meningismus.  Cardiovascular: Normal rate, regular rhythm, normal heart sounds and intact distal pulses.   No murmur heard. Pulmonary/Chest: He is in respiratory distress. He has wheezes.  Exam limited by body habitus. Decreased breath sounds at the bases, scattered expiratory wheezing.  Abdominal: Soft. There is no tenderness. There is no rebound and no guarding.  Morbidly obese  Musculoskeletal: He exhibits edema and tenderness.  Diffuse erythema and induration to the bilateral lower extremities. Unable to palpate pulses but they're found easily with Doppler.  Neurological: He is alert and oriented to person, place, and time. No cranial nerve deficit. He exhibits normal muscle tone. Coordination normal.  No ataxia on finger to nose bilaterally. No pronator drift. 5/5 strength throughout. CN 2-12 intact. Equal grip strength. Sensation intact.  Skin: Skin is warm.  Psychiatric: He has a normal mood and affect. His behavior is normal.  Nursing note and vitals reviewed.   ED Course  Procedures (including critical care time) Labs Review Labs Reviewed  CBC WITH DIFFERENTIAL/PLATELET - Abnormal; Notable for the following:    Neutrophils Relative % 88 (*)    Neutro Abs 8.5 (*)    Lymphocytes Relative 9 (*)     All other components within normal limits  BASIC METABOLIC PANEL - Abnormal; Notable for the following:    Glucose, Bld 260 (*)    All other components within normal limits  BLOOD GAS, ARTERIAL - Abnormal; Notable for the following:    pCO2 arterial 46.6 (*)    pO2, Arterial 76.4 (*)    Bicarbonate 27.6 (*)    Acid-Base Excess 3.0 (*)    All other components within normal limits  GLUCOSE, CAPILLARY - Abnormal; Notable for the following:    Glucose-Capillary 229 (*)    All other components within normal limits  BRAIN NATRIURETIC PEPTIDE  TROPONIN I  TSH  COMPREHENSIVE METABOLIC PANEL  CBC    Imaging Review Dg Chest 2 View  07/27/2014   CLINICAL DATA:  Shortness of breath  EXAM: CHEST  2 VIEW  COMPARISON:  09/19/2012  FINDINGS: Lungs are clear.  No pleural effusion or pneumothorax.  The heart is normal in size.  Degenerative changes of the visualized  thoracolumbar spine.  IMPRESSION: No evidence of acute cardiopulmonary disease.   Electronically Signed   By: Charline Bills M.D.   On: 07/27/2014 18:09     EKG Interpretation   Date/Time:  Sunday July 27 2014 16:59:20 EDT Ventricular Rate:  111 PR Interval:  172 QRS Duration: 75 QT Interval:  309 QTC Calculation: 420 R Axis:   78 Text Interpretation:  Sinus tachycardia Low voltage, precordial leads  Anteroseptal infarct, old Rate faster Confirmed by Edin Kon  MD, Dezzie Badilla  [phone]) on 07/27/2014 7:32:08 PM      MDM   Final diagnoses:  COPD exacerbation   respiratory distress with history of asthma and COPD and sleep apnea.  EKG sinus tachycardia. Patient given nebulizers and steroids on arrival. Chest x-ray shows no pneumonia.  Patient given hour-long nebulizer with solu-Medrol. He requires oxygen to maintain his oxygen saturations greater than 93%. With ambulation he drops to 83%. He'll require admission for ongoing treatments for COPD exacerbation.  D/w Dr. Karilyn Cota.      Glynn Octave, MD 07/27/14 2325

## 2014-07-27 NOTE — ED Notes (Signed)
Ambulated pt became short winded w/ O2 sats dropping down to 83%. EDP notified.

## 2014-07-27 NOTE — H&P (Signed)
[facility] History and Physical  Brett Curry DOB: August 09, 1960 DOA: 07/27/2014  Referring physician: ER, Dr. Lajean Saverrancor PCP: Brett Curry,ROY, Brett Curry   Chief Complaint: Dyspnea  HPI: Brett Curry is a 54 y.o. male  This is a 54 year old man, morbidly obese, asthma, COPD and a history of sleep apnea now presents with 2-3 days history of increasing dyspnea, cough and wheezing with onset fairly rapid. He denies fever. His cough is productive of white sputum. He denies any abdominal pain, nausea or vomiting. There is no chest pain. He is a former smoker. Evaluation in the emergency room showed that he was wheezing significantly and has not been helped by continuous nebulizer treatment. He is now being admitted for further management. He was hypoxic.   Review of Systems:  Apart from symptoms above, all systems negative.   Past Medical History  Diagnosis Date  . Hyperlipidemia   . Type II or unspecified type diabetes mellitus without mention of complication, not stated as uncontrolled   . Asthma   . COPD (chronic obstructive pulmonary disease)   . Morbid obesity due to excess calories 07/27/2014   Past Surgical History  Procedure Laterality Date  . Shoulder surgery      right  . Knee surgery      right  . Tonsillectomy     Social History:  reports that he quit smoking about 4 years ago. His smoking use included Cigarettes. He has a 45 pack-year smoking history. He has never used smokeless tobacco. He reports that he does not drink alcohol or use illicit drugs.  No Known Allergies  Family History  Problem Relation Age of Onset  . Asthma Sister   . Heart attack Mother     Prior to Admission medications   Medication Sig Start Date End Date Taking? Authorizing Provider  albuterol (PROAIR HFA) 108 (90 BASE) MCG/ACT inhaler Inhale 2 puffs into the lungs every 6 (six) hours as needed for wheezing or shortness of breath.   Yes Historical Provider, Brett Curry  budesonide-formoterol  (SYMBICORT) 160-4.5 MCG/ACT inhaler Inhale 2 puffs into the lungs 2 (two) times daily.   Yes Historical Provider, Brett Curry  glimepiride (AMARYL) 4 MG tablet Take 4 mg by mouth daily before breakfast.   Yes Historical Provider, Brett Curry  lisinopril (PRINIVIL,ZESTRIL) 20 MG tablet Take 20 mg by mouth daily.     Yes Historical Provider, Brett Curry  metFORMIN (GLUCOPHAGE) 1000 MG tablet Take 1,000 mg by mouth 2 (two) times daily.     Yes Historical Provider, Brett Curry  pioglitazone (ACTOS) 15 MG tablet Take 30 mg by mouth daily.    Yes Historical Provider, Brett Curry  pravastatin (PRAVACHOL) 40 MG tablet Take 1 tablet by mouth at bedtime.  09/08/12  Yes Historical Provider, Brett Curry  theophylline (THEODUR) 300 MG 12 hr tablet Take 300 mg by mouth 2 (two) times daily.     Yes Historical Provider, Brett Curry  traMADol (ULTRAM) 50 MG tablet Take 50-100 mg by mouth 4 (four) times daily as needed for moderate pain or severe pain.    Yes Historical Provider, Brett Curry  doxycycline (VIBRA-TABS) 100 MG tablet Take 100 mg by mouth 2 (two) times daily. 07/01/14   Historical Provider, Brett Curry   Physical Exam: Filed Vitals:   07/27/14 1709 07/27/14 1730 07/27/14 1739 07/27/14 1947  BP:  114/71    Pulse:  104    Temp:      TempSrc:      Resp:  17    Height:  Weight:      SpO2: 95% 95% 97% 97%    Wt Readings from Last 3 Encounters:  07/27/14 195.047 kg (430 lb)  08/30/13 177.538 kg (391 lb 6.4 oz)  09/20/12 172.5 kg (380 lb 4.7 oz)    General:  Appears calm and comfortable. He does not appear to have increased work of breathing at rest. There is no peripheral or central cyanosis. He is morbidly obese. He is alert and orientated. Eyes: PERRL, normal lids, irises & conjunctiva ENT: grossly normal hearing, lips & tongue Neck: no LAD, masses or thyromegaly Cardiovascular: RRR, no m/r/g. No LE edema. Telemetry: SR, no arrhythmias  Respiratory: Reduced air entry bilaterally. At the present time he has a nebulizer that he is receiving and I cannot hear any  wheezing. There are no crackles or bronchial breathing. Abdomen: soft, ntnd Skin: no rash or induration seen on limited exam Musculoskeletal: grossly normal tone BUE/BLE Psychiatric: grossly normal mood and affect, speech fluent and appropriate Neurologic: grossly non-focal.          Labs on Admission:  Basic Metabolic Panel:  Recent Labs Lab 07/27/14 1722  NA 137  K 4.9  CL 101  CO2 27  GLUCOSE 260*  BUN 16  CREATININE 0.78  CALCIUM 9.2   Liver Function Tests: No results for input(s): AST, ALT, ALKPHOS, BILITOT, PROT, ALBUMIN in the last 168 hours. No results for input(s): LIPASE, AMYLASE in the last 168 hours. No results for input(s): AMMONIA in the last 168 hours. CBC:  Recent Labs Lab 07/27/14 1722  WBC 9.7  NEUTROABS 8.5*  HGB 13.0  HCT 40.8  MCV 91.3  PLT 207   Cardiac Enzymes:  Recent Labs Lab 07/27/14 1722  TROPONINI <0.03    BNP (last 3 results)  Recent Labs  07/27/14 1722  BNP 34.0    ProBNP (last 3 results) No results for input(s): PROBNP in the last 8760 hours.  CBG: No results for input(s): GLUCAP in the last 168 hours.  Radiological Exams on Admission: Dg Chest 2 View  07/27/2014   CLINICAL DATA:  Shortness of breath  EXAM: CHEST  2 VIEW  COMPARISON:  09/19/2012  FINDINGS: Lungs are clear.  No pleural effusion or pneumothorax.  The heart is normal in size.  Degenerative changes of the visualized thoracolumbar spine.  IMPRESSION: No evidence of acute cardiopulmonary disease.   Electronically Signed   By: Charline Bills M.D.   On: 07/27/2014 18:09      Assessment/Plan   1. COPD exacerbation. He will be treated with intravenous steroids and intravenous Levaquin. 2. Acute respiratory failure with hypoxemia. This is a reflection of acute exacerbation and his chronic obstructive sleep apnea status. 3. Obstructive sleep apnea. He will have CPAP in the hospital. 4. Diabetes. Continue with home medications and sliding scale  insulin. 5. Morbid obesity. Nutritional consult. He has managed to gain 50 pounds in the last couple of years.  Further recommendations will depend on patient's hospital progress.   Code Status: Full code.  DVT Prophylaxis: Lovenox.  Family Communication: I discussed the plan with the patient and patient's wife at the bedside.   Disposition Plan: Home when medically stable.   Time spent: 45 minutes.  Wilson Singer [facility] Pager [phone].

## 2014-07-28 LAB — COMPREHENSIVE METABOLIC PANEL
ALT: 31 U/L (ref 17–63)
AST: 18 U/L (ref 15–41)
Albumin: 3.5 g/dL (ref 3.5–5.0)
Alkaline Phosphatase: 50 U/L (ref 38–126)
Anion gap: 9 (ref 5–15)
BUN: 16 mg/dL (ref 6–20)
CHLORIDE: 100 mmol/L — AB (ref 101–111)
CO2: 28 mmol/L (ref 22–32)
CREATININE: 0.64 mg/dL (ref 0.61–1.24)
Calcium: 8.9 mg/dL (ref 8.9–10.3)
GFR calc Af Amer: >60 mL/min (ref >60)
GFR calc non Af Amer: >60 mL/min (ref >60)
Glucose, Bld: 279 mg/dL — ABNORMAL HIGH (ref 65–99)
Potassium: 5.1 mmol/L (ref 3.5–5.1)
SODIUM: 137 mmol/L (ref 135–145)
TOTAL PROTEIN: 7.4 g/dL (ref 6.5–8.1)
Total Bilirubin: 0.5 mg/dL (ref 0.3–1.2)

## 2014-07-28 LAB — CBC
HEMATOCRIT: 40.6 % (ref 39.0–52.0)
HEMOGLOBIN: 12.6 g/dL — AB (ref 13.0–17.0)
MCH: 28.6 pg (ref 26.0–34.0)
MCHC: 31 g/dL (ref 30.0–36.0)
MCV: 92.1 fL (ref 78.0–100.0)
Platelets: 231 10*3/uL (ref 150–400)
RBC: 4.41 MIL/uL (ref 4.22–5.81)
RDW: 15.6 % — ABNORMAL HIGH (ref 11.5–15.5)
WBC: 11.8 10*3/uL — AB (ref 4.0–10.5)

## 2014-07-28 LAB — GLUCOSE, CAPILLARY
GLUCOSE-CAPILLARY: 233 mg/dL — AB (ref 65–99)
Glucose-Capillary: 263 mg/dL — ABNORMAL HIGH (ref 65–99)
Glucose-Capillary: 245 mg/dL — ABNORMAL HIGH (ref 65–99)
Glucose-Capillary: 355 mg/dL — ABNORMAL HIGH (ref 65–99)

## 2014-07-28 MED ORDER — LORAZEPAM 1 MG PO TABS
1.0000 mg | ORAL_TABLET | Freq: Three times a day (TID) | ORAL | Status: DC | PRN
  Administered 2014-07-28: 1 mg via ORAL
  Filled 2014-07-28: qty 1

## 2014-07-28 MED ORDER — IPRATROPIUM-ALBUTEROL 0.5-2.5 (3) MG/3ML IN SOLN
3.0000 mL | Freq: Four times a day (QID) | RESPIRATORY_TRACT | Status: DC
  Administered 2014-07-28 – 2014-07-29 (×4): 3 mL via RESPIRATORY_TRACT
  Filled 2014-07-28 (×3): qty 3

## 2014-07-28 MED ORDER — ENOXAPARIN SODIUM 100 MG/ML ~~LOC~~ SOLN
0.5000 mg/kg | SUBCUTANEOUS | Status: DC
  Administered 2014-07-28: 100 mg via SUBCUTANEOUS
  Filled 2014-07-28: qty 1

## 2014-07-28 NOTE — Progress Notes (Signed)
Inpatient Diabetes Program Recommendations  AACE/ADA: New Consensus Statement on Inpatient Glycemic Control (2013)  Target Ranges:  Prepandial:   less than 140 mg/dL      Peak postprandial:   less than 180 mg/dL (1-2 hours)      Critically ill patients:  140 - 180 mg/dL   Results for Brett Curry, [name] (MRN [id number]) as of 07/28/2014 10:36  Ref. Range 07/27/2014 22:35 07/28/2014 07:51  Glucose-Capillary Latest Ref Range: 65-99 mg/dL 782229 (H) 956263 (H)   Diabetes history: Type 2 diabetes Outpatient Diabetes medications: Actos 30 mg daily, Amaryl 4 mg daily, Metformin 1000 mg bid Current orders for Inpatient glycemic control:   Actos 30 mg daily, Amaryl 4 mg daily, Metformin 1000 mg bid, Novolog resistant tid with meals and HS  Note patient is currently on IV Solumedrol 125 mg q 12 hours.  Please consider adding basal insulin such as Levemir 25 units daily while patient is in the hospital.   Thanks, Beryl MeagerJenny Lafawn Lenoir, RN, BC-ADM Inpatient Diabetes Coordinator Pager [phone] (8a-5p)

## 2014-07-28 NOTE — Progress Notes (Signed)
Subjective: Brett Curry was admitted yesterday evening with coughing, wheezing and shortness of breath. He was hypoxic initially. He is feeling better today.    Objective: Vital signs in last 24 hours: Filed Vitals:   07/27/14 2100 07/27/14 2228 07/28/14 0008 07/28/14 0631  BP: 130/68 136/76  113/81  Pulse: 101 102 103 94  Temp:  98.5 F (36.9 C)  97.9 F (36.6 C)  TempSrc:  Oral  Oral  Resp:   18 18  Height:      Weight:  430 lb (195.047 kg)    SpO2: 92% 93% 95% 95%   Weight change:   Intake/Output Summary (Last 24 hours) at 07/28/14 0734 Last data filed at 07/28/14 0631  Gross per 24 hour  Intake    720 ml  Output   1750 ml  Net  -1030 ml    Physical Exam: Alert. No distress. Lungs reveal diminished breath sounds. Heart regular with no murmurs. Abdomen is obese. Extremities reveal stable edema and chronic venous insufficiency changes in the lower legs.  Lab Results:    Results for orders placed or performed during the hospital encounter of 07/27/14 (from the past 24 hour(s))  TSH     Status: None   Collection Time: 07/27/14  5:04 PM  Result Value Ref Range   TSH 1.123 0.350 - 4.500 uIU/mL  CBC with Differential/Platelet     Status: Abnormal   Collection Time: 07/27/14  5:22 PM  Result Value Ref Range   WBC 9.7 4.0 - 10.5 K/uL   RBC 4.47 4.22 - 5.81 MIL/uL   Hemoglobin 13.0 13.0 - 17.0 g/dL   HCT 96.0 45.4 - 09.8 %   MCV 91.3 78.0 - 100.0 fL   MCH 29.1 26.0 - 34.0 pg   MCHC 31.9 30.0 - 36.0 g/dL   RDW 11.9 14.7 - 82.9 %   Platelets 207 150 - 400 K/uL   Neutrophils Relative % 88 (H) 43 - 77 %   Neutro Abs 8.5 (H) 1.7 - 7.7 K/uL   Lymphocytes Relative 9 (L) 12 - 46 %   Lymphs Abs 0.9 0.7 - 4.0 K/uL   Monocytes Relative 3 3 - 12 %   Monocytes Absolute 0.3 0.1 - 1.0 K/uL   Eosinophils Relative 0 0 - 5 %   Eosinophils Absolute 0.0 0.0 - 0.7 K/uL   Basophils Relative 0 0 - 1 %   Basophils Absolute 0.0 0.0 - 0.1 K/uL  Basic metabolic panel     Status: Abnormal    Collection Time: 07/27/14  5:22 PM  Result Value Ref Range   Sodium 137 135 - 145 mmol/L   Potassium 4.9 3.5 - 5.1 mmol/L   Chloride 101 101 - 111 mmol/L   CO2 27 22 - 32 mmol/L   Glucose, Bld 260 (H) 65 - 99 mg/dL   BUN 16 6 - 20 mg/dL   Creatinine, Ser 5.62 0.61 - 1.24 mg/dL   Calcium 9.2 8.9 - 13.0 mg/dL   GFR calc non Af Amer >60 >60 mL/min   GFR calc Af Amer >60 >60 mL/min   Anion gap 9 5 - 15  Brain natriuretic peptide     Status: None   Collection Time: 07/27/14  5:22 PM  Result Value Ref Range   B Natriuretic Peptide 34.0 0.0 - 100.0 pg/mL  Troponin I     Status: None   Collection Time: 07/27/14  5:22 PM  Result Value Ref Range   Troponin I <0.03 <0.031 ng/mL  Blood gas, arterial ([facility] & [facility] ONLY)     Status: Abnormal   Collection Time: 07/27/14  5:50 PM  Result Value Ref Range   O2 Content 2.0 L/min   Delivery systems NASAL CANNULA    pH, Arterial 7.390 7.350 - 7.450   pCO2 arterial 46.6 (H) 35.0 - 45.0 mmHg   pO2, Arterial 76.4 (L) 80.0 - 100.0 mmHg   Bicarbonate 27.6 (H) 20.0 - 24.0 mEq/L   TCO2 24.7 0 - 100 mmol/L   Acid-Base Excess 3.0 (H) 0.0 - 2.0 mmol/L   O2 Saturation 92.0 %   Patient temperature 37.0    Collection site LEFT RADIAL    Drawn by [id number]    Sample type ARTERIAL    Allens test (pass/fail) PASS PASS  Glucose, capillary     Status: Abnormal   Collection Time: 07/27/14 10:35 PM  Result Value Ref Range   Glucose-Capillary 229 (H) 65 - 99 mg/dL   Comment 1 Notify RN    Comment 2 Document in Chart   CBC     Status: Abnormal   Collection Time: 07/28/14  6:22 AM  Result Value Ref Range   WBC 11.8 (H) 4.0 - 10.5 K/uL   RBC 4.41 4.22 - 5.81 MIL/uL   Hemoglobin 12.6 (L) 13.0 - 17.0 g/dL   HCT 16.1 09.6 - 04.5 %   MCV 92.1 78.0 - 100.0 fL   MCH 28.6 26.0 - 34.0 pg   MCHC 31.0 30.0 - 36.0 g/dL   RDW 40.9 (H) 81.1 - 91.4 %   Platelets 231 150 - 400 K/uL     ABGS  Recent Labs  07/27/14 1750  PHART 7.390  PO2ART 76.4*  TCO2 24.7  HCO3 27.6*    CULTURES No results found for this or any previous visit (from the past 240 hour(s)). Studies/Results: Dg Chest 2 View  07/27/2014   CLINICAL DATA:  Shortness of breath  EXAM: CHEST  2 VIEW  COMPARISON:  09/19/2012  FINDINGS: Lungs are clear.  No pleural effusion or pneumothorax.  The heart is normal in size.  Degenerative changes of the visualized thoracolumbar spine.  IMPRESSION: No evidence of acute cardiopulmonary disease.   Electronically Signed   By: Charline Bills M.D.   On: 07/27/2014 18:09   Micro Results: No results found for this or any previous visit (from the past 240 hour(s)). Studies/Results: Dg Chest 2 View  07/27/2014   CLINICAL DATA:  Shortness of breath  EXAM: CHEST  2 VIEW  COMPARISON:  09/19/2012  FINDINGS: Lungs are clear.  No pleural effusion or pneumothorax.  The heart is normal in size.  Degenerative changes of the visualized thoracolumbar spine.  IMPRESSION: No evidence of acute cardiopulmonary disease.   Electronically Signed   By: Charline Bills M.D.   On: 07/27/2014 18:09   Medications:  I have reviewed the patient's current medications Scheduled Meds: . enoxaparin (LOVENOX) injection  40 mg Subcutaneous Q24H  . glimepiride  4 mg Oral QAC breakfast  . insulin aspart  0-20 Units Subcutaneous TID WC  . insulin aspart  0-5 Units Subcutaneous QHS  . ipratropium-albuterol  3 mL Nebulization Q6H  . levofloxacin (LEVAQUIN) IV  500 mg Intravenous Q24H  . lisinopril  20 mg Oral Daily  . metFORMIN  1,000 mg Oral BID WC  . methylPREDNISolone (SOLU-MEDROL) injection  125 mg Intravenous Q12H  . pioglitazone  30 mg Oral Daily  . pravastatin  40 mg Oral QHS  . theophylline  300 mg Oral  BID   Continuous Infusions:  PRN Meds:.albuterol, LORazepam, ondansetron **OR** ondansetron (ZOFRAN) IV, traMADol   Assessment/Plan: #1. COPD exacerbation. He will be treated with Levaquin, Solu-Medrol, duo nebs and supplemental oxygen. A theophylline level will be checked. #2.  Diabetes. Continue metformin, glimepiride and pioglitazone. He will be treated with sliding scale insulin. #3. Morbid obesity. #4. Sleep apnea. Continue cpap Active Problems:   Diabetes   Obstructive sleep apnea   Acute respiratory failure with hypoxia   COPD exacerbation   Morbid obesity due to excess calories     LOS: 1 day   Brett Curry 07/28/2014, 7:34 AM

## 2014-07-28 NOTE — Plan of Care (Signed)
Problem: Food- and Nutrition-Related Knowledge Deficit (NB-1.1) Goal: Nutrition education Formal process to instruct or train a patient/client in a skill or to impart knowledge to help patients/clients voluntarily manage or modify food choices and eating behavior to maintain or improve health. Outcome: Adequate for Discharge  RD consulted for nutrition education regarding diabetes. Pt says both he and his wife are diabetic and that "she is worse".     Lab Results  Component Value Date    HGBA1C 7.3* 09/20/2012    RD provided "Healthy [employer] Shopping Guide, Nutrition in the fast lane booklet (one for his wife as well), and Recommended CHO guide for wt loss. Discussed different food groups and their effects on blood sugar, emphasizing carbohydrate-containing foods. Provided list of carbohydrates and recommended serving sizes of common foods.  Discussed importance of controlled and consistent carbohydrate intake throughout the day. Provided examples of ways to balance meals/snacks and encouraged intake of high-fiber, whole grain complex carbohydrates. Teach back method used.  Expect good compliance. Pt Dr. (ortho?) is recommending he decrease his wt >100# in order for him to be cleared for knee surgery. Pt has already begun decreasing portions, limiting excess starch intake such as potatoes and has been eating fruit instead of a piece of cake which was his usual daily habit. He also has been using whole wheat bread for toast at breakfast instead of white.  Body mass index is 58.31 kg/(m^2). Pt meets criteria for obesity class III based on current BMI.  Current diet order is CHO Modfied, patient is consuming approximately 75-100%% of meals at this time. Labs and medications reviewed. No further nutrition interventions warranted at this time. RD contact information provided. If additional nutrition issues arise, please re-consult RD.   Pt says he is interested in pursing bariatric surgery. He has  already attended an informational type meeting. Provided him additional information [facility]. Recommend MD referral if agrees if not to bariatric center then suggest he follow up with an outpatient RD at [facility].    Royann ShiversLynn Aven Christen MS,RD,CSG,LDN Office: [phone] Pager: [phone]

## 2014-07-28 NOTE — Progress Notes (Signed)
Pt just walked from window back to bed and spo2 down... Pt has no problem when CPAP on whne resting but instructed to call if help is needed will check back in and monitor through out the night...spo2 increased to 97% with treatment

## 2014-07-28 NOTE — Care Management Note (Signed)
Case Management Note  Patient Details  Name: Narda BondsRandy P Feimster MRN: [id number] Date of Birth: 1960-03-05  Expected Discharge Date:                  Expected Discharge Plan:  Home/Self Care  In-House Referral:  NA  Discharge planning Services  CM Consult  Post Acute Care Choice:  NA Choice offered to:  NA  DME Arranged:    DME Agency:     HH Arranged:    HH Agency:     Status of Service:  In process, will continue to follow  Medicare Important Message Given:    Date Medicare IM Given:    Medicare IM give by:    Date Additional Medicare IM Given:    Additional Medicare Important Message give by:     If discussed at Long Length of Stay Meetings, dates discussed:    Additional Comments: Pt is from home, lives with family. Pt independent at baseline. Pt admitted with COPD exacerbation. Pt has neb machine and CPAP at home. Pt has no DME needs or HH services prior to admission. Pt plans to return home at [place] with self care. No CM needs anticipated. Will cont to follow.  Malcolm Metrohildress, Gwendlyn Hanback Demske, RN 07/28/2014, 3:12 PM

## 2014-07-29 LAB — THEOPHYLLINE LEVEL: Theophylline Lvl: 1.9 ug/mL — ABNORMAL LOW (ref 10.0–20.0)

## 2014-07-29 LAB — GLUCOSE, CAPILLARY: Glucose-Capillary: 298 mg/dL — ABNORMAL HIGH (ref 65–99)

## 2014-07-29 MED ORDER — PREDNISONE 20 MG PO TABS: 20.0000 mg | ORAL_TABLET | Freq: Every day | ORAL | Status: DC

## 2014-07-29 MED ORDER — LEVOFLOXACIN 500 MG PO TABS: 500.0000 mg | ORAL_TABLET | Freq: Every day | ORAL | Status: DC

## 2014-07-29 NOTE — Care Management Note (Signed)
Case Management Note  Patient Details  Name: Narda BondsRandy P Agner MRN: [id number] Date of Birth: 1960/11/08  Expected Discharge Date:                  Expected Discharge Plan:  Home/Self Care  In-House Referral:  NA  Discharge planning Services  CM Consult  Post Acute Care Choice:  NA Choice offered to:  NA  DME Arranged:    DME Agency:     HH Arranged:    HH Agency:     Status of Service:  Completed, signed off  Medicare Important Message Given:    Date Medicare IM Given:    Medicare IM give by:    Date Additional Medicare IM Given:    Additional Medicare Important Message give by:     If discussed at Long Length of Stay Meetings, dates discussed:    Additional Comments: Pt discharging home today with self care. Pt does not meet qualifications for home O2. Pt made aware. No CM needs at the time of discharge.  Malcolm Metrohildress, Jahziel Sinn Demske, RN 07/29/2014, 11:44 AM

## 2014-07-29 NOTE — Progress Notes (Signed)
SATURATION QUALIFICATIONS: (This note is used to comply with regulatory documentation for home oxygen)  Patient Saturations on Room Air at Rest = 92%  Patient Saturations on Room Air while Ambulating = 89%  Patient Saturations on X Liters of oxygen while Ambulating = X%  Please briefly explain why patient needs home oxygen: N/A

## 2014-07-29 NOTE — Discharge Summary (Signed)
Physician Discharge Summary  [name] DOB: 1960/08/17 DOA: 07/27/2014   Admit date: 07/27/2014 Discharge date: 07/29/2014  Discharge Diagnoses: #1. Acute exacerbation of chronic bronchitis. #2. Type 2 diabetes. #3. Obstructive sleep apnea. #4. Morbid obesity. #5. Acute respiratory failure with hypoxia. Active Problems:   Diabetes   Obstructive sleep apnea   Acute respiratory failure with hypoxia   COPD exacerbation   Morbid obesity due to excess calories    Wt Readings from Last 3 Encounters:  07/27/14 430 lb (195.047 kg)  08/30/13 391 lb 6.4 oz (177.538 kg)  09/20/12 380 lb 4.7 oz (172.5 kg)     Hospital Course:  This patient is a 54 year old male who presented with shortness of breath, coughing and wheezing. His chest x-ray revealed no acute infiltrate. Blood gases revealed no significant CO2 retention. He was treated with inhaled blocker dilators, Levaquin, Solu-Medrol and supplemental oxygen. He was initially hypoxic. His white count was normal on admission. He responded well to treatment. His wheezing resolved. He was much improved and stable for discharge on the morning of July 12. He will receive continue treatment with Levaquin, prednisone, albuterol and ipratropium nebulizer treatments and when necessary albuterol by metered-dose inhaler. A walk test will be obtained to see if he requires supplemental oxygen at home. Theophylline can be discontinued. His level was low. He will continue his diabetic therapy with metformin, glimepiride and pioglitazone. He was encouraged to discontinue tobacco use and to continue working on his weight. He will be seen in follow-up in my office in one week.   Discharge Instructions     Medication List    STOP taking these medications        doxycycline 100 MG tablet  Commonly known as:  VIBRA-TABS     theophylline 300 MG 12 hr tablet  Commonly known as:  THEODUR      TAKE these medications        budesonide-formoterol 160-4.5 MCG/ACT inhaler  Commonly known as:  SYMBICORT  Inhale 2 puffs into the lungs 2 (two) times daily.     glimepiride 4 MG tablet  Commonly known as:  AMARYL  Take 4 mg by mouth daily before breakfast.     levofloxacin 500 MG tablet  Commonly known as:  LEVAQUIN  Take 1 tablet (500 mg total) by mouth daily.     lisinopril 20 MG tablet  Commonly known as:  PRINIVIL,ZESTRIL  Take 20 mg by mouth daily.     metFORMIN 1000 MG tablet  Commonly known as:  GLUCOPHAGE  Take 1,000 mg by mouth 2 (two) times daily.     pioglitazone 15 MG tablet  Commonly known as:  ACTOS  Take 30 mg by mouth daily.     pravastatin 40 MG tablet  Commonly known as:  PRAVACHOL  Take 1 tablet by mouth at bedtime.     predniSONE 20 MG tablet  Commonly known as:  DELTASONE  Take 1 tablet (20 mg total) by mouth daily with breakfast.     PROAIR HFA 108 (90 BASE) MCG/ACT inhaler  Generic drug:  albuterol  Inhale 2 puffs into the lungs every 6 (six) hours as needed for wheezing or shortness of breath.     traMADol 50 MG tablet  Commonly known as:  ULTRAM  Take 50-100 mg by mouth 4 (four) times daily as needed for moderate pain or severe pain.         Rece Zechman 07/29/2014

## 2014-07-29 NOTE — Progress Notes (Signed)
NURSING PROGRESS NOTE  Daisy [facility]azarRandy P [facility] [id number] Discharge Data: 07/29/2014 10:20 AM Attending Provider: No att. providers found WUJ:WJXBJ,YNWPCP:FAGAN,ROY, MD   Daisy Lazarandy P Mullins to be D/C'd Home per MD order.    All IV's discontinued and monitored for bleeding.  All belongings returned to patient for patient to take home.  AVS summary and prescriptions reviewed with patient.  Patient left floor via wheelchair, escorted by NT.  Last Documented Vital Signs:  Blood pressure 121/87, pulse 104, temperature 98.3 F (36.8 C), temperature source Oral, resp. rate 20, height 6' (1.829 m), weight 195.047 kg (430 lb), SpO2 93 %.  Mertha BaarsHorine, Hendy Brindle D

## 2014-08-05 DIAGNOSIS — J441 Chronic obstructive pulmonary disease with (acute) exacerbation: Secondary | ICD-10-CM | POA: Diagnosis not present

## 2014-08-05 DIAGNOSIS — Z6841 Body Mass Index (BMI) 40.0 and over, adult: Secondary | ICD-10-CM | POA: Diagnosis not present

## 2014-09-05 ENCOUNTER — Ambulatory Visit: Payer: Medicare Other | Admitting: Pulmonary Disease

## 2014-09-08 DIAGNOSIS — E119 Type 2 diabetes mellitus without complications: Secondary | ICD-10-CM | POA: Diagnosis not present

## 2014-09-16 DIAGNOSIS — E119 Type 2 diabetes mellitus without complications: Secondary | ICD-10-CM | POA: Diagnosis not present

## 2014-09-16 DIAGNOSIS — L02416 Cutaneous abscess of left lower limb: Secondary | ICD-10-CM | POA: Diagnosis not present

## 2014-09-17 ENCOUNTER — Ambulatory Visit (INDEPENDENT_AMBULATORY_CARE_PROVIDER_SITE_OTHER): Payer: Medicare Other | Admitting: Pulmonary Disease

## 2014-09-17 ENCOUNTER — Encounter: Payer: Self-pay | Admitting: Pulmonary Disease

## 2014-09-17 VITALS — BP 116/72 | HR 104 | Ht 73.0 in | Wt >= 6400 oz

## 2014-09-17 DIAGNOSIS — F17210 Nicotine dependence, cigarettes, uncomplicated: Secondary | ICD-10-CM

## 2014-09-17 DIAGNOSIS — G4733 Obstructive sleep apnea (adult) (pediatric): Secondary | ICD-10-CM | POA: Diagnosis not present

## 2014-09-17 DIAGNOSIS — Z9989 Dependence on other enabling machines and devices: Principal | ICD-10-CM

## 2014-09-17 DIAGNOSIS — Z720 Tobacco use: Secondary | ICD-10-CM

## 2014-09-17 DIAGNOSIS — J449 Chronic obstructive pulmonary disease, unspecified: Secondary | ICD-10-CM | POA: Diagnosis not present

## 2014-09-17 DIAGNOSIS — Z6841 Body Mass Index (BMI) 40.0 and over, adult: Secondary | ICD-10-CM

## 2014-09-17 NOTE — Progress Notes (Addendum)
Chief Complaint  Patient presents with  . Follow-up    former [facility] pt last seen 08/30/13. Pt reports he is wearing CPAP nightly. has no complaints.     History of Present Illness: Brett Curry is a 54 y.o. male smoker with OSA.  He was previously seen by Dr. Shelle Iron.  He has been doing well with CPAP.  He has full face mask.  He gets about 54 yo 7 hours sleep per night.  He is smoking about 1 pack per day.  He feels bored, and smoking keeps him busy.   TESTS: PSG 08/05/09 >> AHI 90 Auto CPAP 06/19/14 to 09/16/14 >> used on 88 of 90 nights with average 5 hrs and 3 min.  Average AHI is 8.5 with median CPAP 11 cm H2O and 95 th percentile CPAP 16 cm H20.   PMhx >> HLD, DM, COPD/Asthma  Past surgical hx, Medications, Allergies, Family hx, Social hx all reviewed.   Physical Exam: BP 116/72 mmHg  Pulse 104  Ht  (1.854 m)  Wt 419 lb 9.6 oz (190.329 kg)  BMI 55.37 kg/m2  SpO2 92%  General - No distress ENT - No sinus tenderness, no oral exudate, no LAN, wears dentures, MP 4 Cardiac - s1s2 regular, no murmur Chest - No wheeze/rales/dullness Back - No focal tenderness Abd - Soft, non-tender Ext - No edema Neuro - Normal strength Skin - No rashes Psych - normal mood, and behavior   Assessment/Plan:  Obstructive sleep apnea. He is compliant with CPAP and reports benefit Plan: - continue auto CPAP  COPD with chronic bronchitis. Plan: - he is followed by his PCP for this  Tobacco abuse. Plan: - discussed options to assist with smoking cessation >> he is not ready to quite at this time  Lung cancer screening. Plan: - reviewed low dose CT screening program >> he would like to speak with his PCP about this  Obesity. Plan: - discussed options to assist with weight loss   Coralyn Helling, MD [facility] Pager:  [phone]

## 2014-09-17 NOTE — Patient Instructions (Signed)
Follow up in 1 year.

## 2014-09-18 ENCOUNTER — Ambulatory Visit: Payer: Medicare Other | Admitting: Pulmonary Disease

## 2014-10-29 DIAGNOSIS — J209 Acute bronchitis, unspecified: Secondary | ICD-10-CM | POA: Diagnosis not present

## 2014-11-18 DIAGNOSIS — Z23 Encounter for immunization: Secondary | ICD-10-CM | POA: Diagnosis not present

## 2015-01-01 DIAGNOSIS — Z79899 Other long term (current) drug therapy: Secondary | ICD-10-CM | POA: Diagnosis not present

## 2015-01-01 DIAGNOSIS — M1711 Unilateral primary osteoarthritis, right knee: Secondary | ICD-10-CM | POA: Diagnosis not present

## 2015-01-01 DIAGNOSIS — M170 Bilateral primary osteoarthritis of knee: Secondary | ICD-10-CM | POA: Diagnosis not present

## 2015-01-01 DIAGNOSIS — M25562 Pain in left knee: Secondary | ICD-10-CM | POA: Diagnosis not present

## 2015-01-01 DIAGNOSIS — E119 Type 2 diabetes mellitus without complications: Secondary | ICD-10-CM | POA: Diagnosis not present

## 2015-01-01 DIAGNOSIS — M25561 Pain in right knee: Secondary | ICD-10-CM | POA: Diagnosis not present

## 2015-01-01 DIAGNOSIS — R262 Difficulty in walking, not elsewhere classified: Secondary | ICD-10-CM | POA: Diagnosis not present

## 2015-03-05 ENCOUNTER — Other Ambulatory Visit (HOSPITAL_COMMUNITY): Payer: Self-pay | Admitting: General Surgery

## 2015-03-05 DIAGNOSIS — Z6841 Body Mass Index (BMI) 40.0 and over, adult: Principal | ICD-10-CM

## 2015-03-06 ENCOUNTER — Other Ambulatory Visit (HOSPITAL_COMMUNITY): Payer: Self-pay | Admitting: General Surgery

## 2015-03-06 DIAGNOSIS — Z6841 Body Mass Index (BMI) 40.0 and over, adult: Principal | ICD-10-CM

## 2015-03-16 ENCOUNTER — Other Ambulatory Visit: Payer: Self-pay

## 2015-03-16 ENCOUNTER — Ambulatory Visit (HOSPITAL_COMMUNITY)
Admission: RE | Admit: 2015-03-16 | Discharge: 2015-03-16 | Disposition: A | Discharge status: Home / Self Care | Payer: Medicare Other | Source: Ambulatory Visit | Attending: General Surgery | Admitting: General Surgery

## 2015-03-16 DIAGNOSIS — K224 Dyskinesia of esophagus: Secondary | ICD-10-CM | POA: Insufficient documentation

## 2015-03-16 DIAGNOSIS — Z6841 Body Mass Index (BMI) 40.0 and over, adult: Principal | ICD-10-CM

## 2015-03-26 ENCOUNTER — Ambulatory Visit (INDEPENDENT_AMBULATORY_CARE_PROVIDER_SITE_OTHER): Payer: Medicare Other | Admitting: Psychiatry

## 2015-04-04 ENCOUNTER — Encounter (HOSPITAL_COMMUNITY): Payer: Self-pay | Admitting: *Deleted

## 2015-04-04 ENCOUNTER — Emergency Department (HOSPITAL_COMMUNITY): Payer: Medicare Other

## 2015-04-04 ENCOUNTER — Inpatient Hospital Stay (HOSPITAL_COMMUNITY)
Admission: EM | Admit: 2015-04-04 | Discharge: 2015-04-10 | DRG: 190 | Disposition: A | Discharge status: Home Health | Payer: Medicare Other | Attending: Internal Medicine | Admitting: Internal Medicine

## 2015-04-04 DIAGNOSIS — E875 Hyperkalemia: Secondary | ICD-10-CM

## 2015-04-04 DIAGNOSIS — E662 Morbid (severe) obesity with alveolar hypoventilation: Secondary | ICD-10-CM | POA: Diagnosis present

## 2015-04-04 DIAGNOSIS — J441 Chronic obstructive pulmonary disease with (acute) exacerbation: Secondary | ICD-10-CM | POA: Diagnosis present

## 2015-04-04 DIAGNOSIS — J9601 Acute respiratory failure with hypoxia: Secondary | ICD-10-CM | POA: Diagnosis not present

## 2015-04-04 DIAGNOSIS — A419 Sepsis, unspecified organism: Secondary | ICD-10-CM

## 2015-04-04 DIAGNOSIS — F17200 Nicotine dependence, unspecified, uncomplicated: Secondary | ICD-10-CM | POA: Diagnosis present

## 2015-04-04 DIAGNOSIS — E119 Type 2 diabetes mellitus without complications: Secondary | ICD-10-CM | POA: Diagnosis present

## 2015-04-04 DIAGNOSIS — E872 Acidosis: Secondary | ICD-10-CM | POA: Diagnosis present

## 2015-04-04 DIAGNOSIS — Z825 Family history of asthma and other chronic lower respiratory diseases: Secondary | ICD-10-CM | POA: Diagnosis not present

## 2015-04-04 DIAGNOSIS — E1165 Type 2 diabetes mellitus with hyperglycemia: Secondary | ICD-10-CM

## 2015-04-04 DIAGNOSIS — J9621 Acute and chronic respiratory failure with hypoxia: Secondary | ICD-10-CM | POA: Diagnosis present

## 2015-04-04 DIAGNOSIS — E785 Hyperlipidemia, unspecified: Secondary | ICD-10-CM | POA: Diagnosis present

## 2015-04-04 DIAGNOSIS — F17210 Nicotine dependence, cigarettes, uncomplicated: Secondary | ICD-10-CM | POA: Diagnosis present

## 2015-04-04 DIAGNOSIS — Z6841 Body Mass Index (BMI) 40.0 and over, adult: Secondary | ICD-10-CM

## 2015-04-04 DIAGNOSIS — Z7984 Long term (current) use of oral hypoglycemic drugs: Secondary | ICD-10-CM

## 2015-04-04 DIAGNOSIS — R6520 Severe sepsis without septic shock: Secondary | ICD-10-CM

## 2015-04-04 DIAGNOSIS — J45909 Unspecified asthma, uncomplicated: Secondary | ICD-10-CM | POA: Diagnosis present

## 2015-04-04 DIAGNOSIS — J9602 Acute respiratory failure with hypercapnia: Secondary | ICD-10-CM | POA: Diagnosis present

## 2015-04-04 DIAGNOSIS — G4733 Obstructive sleep apnea (adult) (pediatric): Secondary | ICD-10-CM | POA: Diagnosis present

## 2015-04-04 DIAGNOSIS — Z7952 Long term (current) use of systemic steroids: Secondary | ICD-10-CM | POA: Diagnosis not present

## 2015-04-04 LAB — CBC WITH DIFFERENTIAL/PLATELET
Basophils Absolute: 0 10*3/uL (ref 0.0–0.1)
Basophils Relative: 0 %
Eosinophils Absolute: 0 10*3/uL (ref 0.0–0.7)
Eosinophils Relative: 0 %
HCT: 47.6 % (ref 39.0–52.0)
HEMOGLOBIN: 14.8 g/dL (ref 13.0–17.0)
LYMPHS ABS: 1.7 10*3/uL (ref 0.7–4.0)
LYMPHS PCT: 19 %
MCH: 29.1 pg (ref 26.0–34.0)
MCHC: 31.1 g/dL (ref 30.0–36.0)
MCV: 93.5 fL (ref 78.0–100.0)
Monocytes Absolute: 0.9 10*3/uL (ref 0.1–1.0)
Monocytes Relative: 10 %
NEUTROS ABS: 6.4 10*3/uL (ref 1.7–7.7)
NEUTROS PCT: 71 %
Platelets: 188 10*3/uL (ref 150–400)
RBC: 5.09 MIL/uL (ref 4.22–5.81)
RDW: 16.7 % — ABNORMAL HIGH (ref 11.5–15.5)
WBC: 9 10*3/uL (ref 4.0–10.5)

## 2015-04-04 LAB — BLOOD GAS, ARTERIAL
ACID-BASE EXCESS: 4.1 mmol/L — AB (ref 0.0–2.0)
Bicarbonate: 25.2 mEq/L — ABNORMAL HIGH (ref 20.0–24.0)
DELIVERY SYSTEMS: POSITIVE
Drawn by: 277331
Expiratory PAP: 8
FIO2: 0.45
INSPIRATORY PAP: 16
LHR: 10 {breaths}/min
O2 SAT: 93.7 %
Patient temperature: 99.5
pH, Arterial: 7.211 — ABNORMAL LOW (ref 7.350–7.450)
pO2, Arterial: 85.3 mmHg (ref 80.0–100.0)

## 2015-04-04 LAB — URINALYSIS, ROUTINE W REFLEX MICROSCOPIC
Bilirubin Urine: NEGATIVE
Glucose, UA: 100 mg/dL — AB
Ketones, ur: NEGATIVE mg/dL
LEUKOCYTES UA: NEGATIVE
Nitrite: NEGATIVE
PROTEIN: NEGATIVE mg/dL
SPECIFIC GRAVITY, URINE: 1.025 (ref 1.005–1.030)
pH: 5.5 (ref 5.0–8.0)

## 2015-04-04 LAB — URINE MICROSCOPIC-ADD ON

## 2015-04-04 LAB — BASIC METABOLIC PANEL
Anion gap: 11 (ref 5–15)
BUN: 24 mg/dL — AB (ref 6–20)
CHLORIDE: 92 mmol/L — AB (ref 101–111)
CO2: 34 mmol/L — ABNORMAL HIGH (ref 22–32)
Calcium: 9.2 mg/dL (ref 8.9–10.3)
Creatinine, Ser: 0.84 mg/dL (ref 0.61–1.24)
GFR calc Af Amer: >60 mL/min (ref >60)
GFR calc non Af Amer: >60 mL/min (ref >60)
GLUCOSE: 305 mg/dL — AB (ref 65–99)
POTASSIUM: 5.2 mmol/L — AB (ref 3.5–5.1)
SODIUM: 137 mmol/L (ref 135–145)

## 2015-04-04 LAB — GLUCOSE, CAPILLARY
GLUCOSE-CAPILLARY: 379 mg/dL — AB (ref 65–99)
GLUCOSE-CAPILLARY: 322 mg/dL — AB (ref 65–99)
Glucose-Capillary: 352 mg/dL — ABNORMAL HIGH (ref 65–99)
Glucose-Capillary: 233 mg/dL — ABNORMAL HIGH (ref 65–99)

## 2015-04-04 LAB — I-STAT CG4 LACTIC ACID, ED: Lactic Acid, Venous: 1.04 mmol/L (ref 0.5–2.0)

## 2015-04-04 LAB — TROPONIN I: Troponin I: <0.03 ng/mL (ref <0.031)

## 2015-04-04 LAB — BRAIN NATRIURETIC PEPTIDE: B NATRIURETIC PEPTIDE 5: 25 pg/mL (ref 0.0–100.0)

## 2015-04-04 LAB — MRSA PCR SCREENING: MRSA by PCR: NEGATIVE

## 2015-04-04 MED ORDER — ALBUTEROL SULFATE (2.5 MG/3ML) 0.083% IN NEBU
2.5000 mg | INHALATION_SOLUTION | Freq: Four times a day (QID) | RESPIRATORY_TRACT | Status: DC
  Administered 2015-04-04 – 2015-04-07 (×15): 2.5 mg via RESPIRATORY_TRACT
  Filled 2015-04-04 (×14): qty 3

## 2015-04-04 MED ORDER — DOXYCYCLINE HYCLATE 100 MG IV SOLR
100.0000 mg | Freq: Two times a day (BID) | INTRAVENOUS | Status: DC
  Administered 2015-04-04 – 2015-04-05 (×3): 100 mg via INTRAVENOUS
  Filled 2015-04-04 (×5): qty 100

## 2015-04-04 MED ORDER — TRAMADOL HCL 50 MG PO TABS
50.0000 mg | ORAL_TABLET | Freq: Four times a day (QID) | ORAL | Status: DC | PRN
  Administered 2015-04-05 – 2015-04-09 (×5): 50 mg via ORAL
  Filled 2015-04-04 (×5): qty 1

## 2015-04-04 MED ORDER — CEFTRIAXONE SODIUM 1 G IJ SOLR
1.0000 g | Freq: Once | INTRAMUSCULAR | Status: AC
  Administered 2015-04-04: 1 g via INTRAVENOUS
  Filled 2015-04-04: qty 10

## 2015-04-04 MED ORDER — LORAZEPAM 2 MG/ML IJ SOLN
0.5000 mg | Freq: Four times a day (QID) | INTRAMUSCULAR | Status: DC | PRN
  Administered 2015-04-04 – 2015-04-09 (×9): 0.5 mg via INTRAVENOUS
  Filled 2015-04-04 (×9): qty 1

## 2015-04-04 MED ORDER — ONDANSETRON HCL 4 MG PO TABS: 4.0000 mg | ORAL_TABLET | Freq: Four times a day (QID) | ORAL | Status: DC | PRN

## 2015-04-04 MED ORDER — CHLORHEXIDINE GLUCONATE 0.12 % MT SOLN
15.0000 mL | Freq: Two times a day (BID) | OROMUCOSAL | Status: DC
  Administered 2015-04-04 – 2015-04-09 (×9): 15 mL via OROMUCOSAL
  Filled 2015-04-04 (×10): qty 15

## 2015-04-04 MED ORDER — METHYLPREDNISOLONE SODIUM SUCC 40 MG IJ SOLR
40.0000 mg | Freq: Four times a day (QID) | INTRAMUSCULAR | Status: DC
  Administered 2015-04-04 – 2015-04-07 (×12): 40 mg via INTRAVENOUS
  Filled 2015-04-04 (×12): qty 1

## 2015-04-04 MED ORDER — ALBUTEROL (5 MG/ML) CONTINUOUS INHALATION SOLN
10.0000 mg/h | INHALATION_SOLUTION | Freq: Once | RESPIRATORY_TRACT | Status: AC
  Administered 2015-04-04: 10 mg/h via RESPIRATORY_TRACT
  Filled 2015-04-04: qty 20

## 2015-04-04 MED ORDER — PRAVASTATIN SODIUM 40 MG PO TABS
40.0000 mg | ORAL_TABLET | Freq: Every day | ORAL | Status: DC
  Administered 2015-04-04 – 2015-04-09 (×6): 40 mg via ORAL
  Filled 2015-04-04 (×6): qty 1

## 2015-04-04 MED ORDER — ONDANSETRON HCL 4 MG/2ML IJ SOLN
4.0000 mg | Freq: Four times a day (QID) | INTRAMUSCULAR | Status: DC | PRN
  Administered 2015-04-05 – 2015-04-06 (×3): 4 mg via INTRAVENOUS
  Filled 2015-04-04 (×3): qty 2

## 2015-04-04 MED ORDER — INSULIN DETEMIR 100 UNIT/ML ~~LOC~~ SOLN
10.0000 [IU] | Freq: Two times a day (BID) | SUBCUTANEOUS | Status: DC
  Administered 2015-04-04 – 2015-04-06 (×6): 10 [IU] via SUBCUTANEOUS
  Filled 2015-04-04 (×7): qty 0.1

## 2015-04-04 MED ORDER — POLYETHYLENE GLYCOL 3350 17 G PO PACK: 17.0000 g | PACK | Freq: Every day | ORAL | Status: DC | PRN

## 2015-04-04 MED ORDER — SODIUM CHLORIDE 0.9 % IV BOLUS (SEPSIS)
1000.0000 mL | Freq: Once | INTRAVENOUS | Status: AC
  Administered 2015-04-04: 1000 mL via INTRAVENOUS

## 2015-04-04 MED ORDER — ALBUTEROL SULFATE (2.5 MG/3ML) 0.083% IN NEBU: 2.5000 mg | INHALATION_SOLUTION | Freq: Once | RESPIRATORY_TRACT | Status: DC

## 2015-04-04 MED ORDER — IPRATROPIUM BROMIDE 0.02 % IN SOLN
0.5000 mg | Freq: Once | RESPIRATORY_TRACT | Status: AC
  Administered 2015-04-04: 0.5 mg via RESPIRATORY_TRACT
  Filled 2015-04-04: qty 2.5

## 2015-04-04 MED ORDER — CETYLPYRIDINIUM CHLORIDE 0.05 % MT LIQD
7.0000 mL | Freq: Two times a day (BID) | OROMUCOSAL | Status: DC
  Administered 2015-04-04 – 2015-04-09 (×10): 7 mL via OROMUCOSAL

## 2015-04-04 MED ORDER — SODIUM CHLORIDE 0.9 % IV SOLN
1000.0000 mL | INTRAVENOUS | Status: DC
  Administered 2015-04-04: 1000 mL via INTRAVENOUS

## 2015-04-04 MED ORDER — TIOTROPIUM BROMIDE MONOHYDRATE 18 MCG IN CAPS
18.0000 ug | ORAL_CAPSULE | Freq: Every day | RESPIRATORY_TRACT | Status: DC
  Administered 2015-04-06 – 2015-04-10 (×5): 18 ug via RESPIRATORY_TRACT
  Filled 2015-04-04: qty 5

## 2015-04-04 MED ORDER — ALBUTEROL SULFATE (2.5 MG/3ML) 0.083% IN NEBU: 2.5000 mg | INHALATION_SOLUTION | RESPIRATORY_TRACT | Status: DC | PRN

## 2015-04-04 MED ORDER — IPRATROPIUM-ALBUTEROL 0.5-2.5 (3) MG/3ML IN SOLN: 3.0000 mL | Freq: Once | RESPIRATORY_TRACT | Status: DC

## 2015-04-04 MED ORDER — INSULIN ASPART 100 UNIT/ML ~~LOC~~ SOLN
0.0000 [IU] | SUBCUTANEOUS | Status: DC
  Administered 2015-04-04: 15 [IU] via SUBCUTANEOUS
  Administered 2015-04-04: 7 [IU] via SUBCUTANEOUS
  Administered 2015-04-04: 20 [IU] via SUBCUTANEOUS
  Administered 2015-04-05: 4 [IU] via SUBCUTANEOUS
  Administered 2015-04-05 (×2): 7 [IU] via SUBCUTANEOUS
  Administered 2015-04-05: 15 [IU] via SUBCUTANEOUS

## 2015-04-04 MED ORDER — LISINOPRIL 10 MG PO TABS
20.0000 mg | ORAL_TABLET | Freq: Every day | ORAL | Status: DC
  Administered 2015-04-05: 20 mg via ORAL
  Filled 2015-04-04 (×2): qty 2

## 2015-04-04 MED ORDER — DEXTROSE 5 % IV SOLN
500.0000 mg | Freq: Once | INTRAVENOUS | Status: AC
  Administered 2015-04-04: 500 mg via INTRAVENOUS
  Filled 2015-04-04: qty 500

## 2015-04-04 MED ORDER — HEPARIN SODIUM (PORCINE) 5000 UNIT/ML IJ SOLN
5000.0000 [IU] | Freq: Three times a day (TID) | INTRAMUSCULAR | Status: DC
  Administered 2015-04-04 – 2015-04-09 (×16): 5000 [IU] via SUBCUTANEOUS
  Filled 2015-04-04 (×18): qty 1

## 2015-04-04 MED ORDER — MOMETASONE FURO-FORMOTEROL FUM 200-5 MCG/ACT IN AERO
2.0000 | INHALATION_SPRAY | Freq: Two times a day (BID) | RESPIRATORY_TRACT | Status: DC
  Administered 2015-04-04 – 2015-04-10 (×12): 2 via RESPIRATORY_TRACT
  Filled 2015-04-04: qty 8.8

## 2015-04-04 MED ORDER — NICOTINE 21 MG/24HR TD PT24
21.0000 mg | MEDICATED_PATCH | Freq: Every day | TRANSDERMAL | Status: DC
  Administered 2015-04-04 – 2015-04-10 (×7): 21 mg via TRANSDERMAL
  Filled 2015-04-04 (×7): qty 1

## 2015-04-04 MED ORDER — SODIUM CHLORIDE 0.9 % IV SOLN
INTRAVENOUS | Status: AC
  Administered 2015-04-04 – 2015-04-05 (×2): via INTRAVENOUS

## 2015-04-04 MED ORDER — METHYLPREDNISOLONE SODIUM SUCC 125 MG IJ SOLR
125.0000 mg | Freq: Once | INTRAMUSCULAR | Status: AC
  Administered 2015-04-04: 125 mg via INTRAVENOUS
  Filled 2015-04-04: qty 2

## 2015-04-04 NOTE — H&P (Signed)
[facility] History and Physical  Brett Curry [facility] YQI:[id number] DOB: July 03, 1960 DOA: 04/04/2015  Referring physician: Dr. Kathrynn Humble PCP: Asencion Noble, MD   Chief Complaint: SOB  HPI: Brett Curry is a 55 y.o. male past medical history of COPD ongoing tobacco abuse or really obese comes in for 3 day history of dyspnea and cough. He relates he started getting short of breath which has progressively gotten worse to the point where he can even catch his breath sitting down this morning. He is having a productive cough but not purulent. He denies any fevers at contacts, this morning he was social breath he decided to come into the ED.  Review of Systems:  Constitutional:  No weight loss, night sweats, Fevers, chills, fatigue.  HEENT:  No headaches, Difficulty swallowing,Tooth/dental problems,Sore throat,  No sneezing, itching, ear ache, nasal congestion, post nasal drip,  Cardio-vascular:  No chest pain, Orthopnea, PND, swelling in lower extremities, anasarca, dizziness, palpitations  GI:  No heartburn, indigestion, abdominal pain, nausea, vomiting, diarrhea, change in bowel habits, loss of appetite  Resp:  No change in color of mucus.No wheezing.No chest wall deformity  Skin:  no rash or lesions.  GU:  no dysuria, change in color of urine, no urgency or frequency. No flank pain.  Musculoskeletal:  No joint pain or swelling. No decreased range of motion. No back pain.  Psych:  No change in mood or affect. No depression or anxiety. No memory loss.   Past Medical History  Diagnosis Date  . Hyperlipidemia   . Type II or unspecified type diabetes mellitus without mention of complication, not stated as uncontrolled   . Asthma   . COPD (chronic obstructive pulmonary disease) ([place])   . Morbid obesity due to excess calories ([place]) 07/27/2014   Past Surgical History  Procedure Laterality Date  . Shoulder surgery      right  . Knee surgery      right  . Tonsillectomy     Social  History:  reports that he has been smoking Cigarettes.  He has a 68 pack-year smoking history. He has never used smokeless tobacco. He reports that he does not drink alcohol or use illicit drugs.  No Known Allergies  Family History  Problem Relation Age of Onset  . Asthma Sister   . Heart attack Mother     Prior to Admission medications   Medication Sig Start Date End Date Taking? Authorizing Provider  albuterol (PROAIR HFA) 108 (90 BASE) MCG/ACT inhaler Inhale 2 puffs into the lungs every 6 (six) hours as needed for wheezing or shortness of breath.   Yes Historical Provider, MD  budesonide-formoterol (SYMBICORT) 160-4.5 MCG/ACT inhaler Inhale 2 puffs into the lungs 2 (two) times daily.   Yes Historical Provider, MD  glimepiride (AMARYL) 4 MG tablet Take 4 mg by mouth daily before breakfast.   Yes Historical Provider, MD  lisinopril (PRINIVIL,ZESTRIL) 20 MG tablet Take 20 mg by mouth daily.     Yes Historical Provider, MD  metFORMIN (GLUCOPHAGE) 1000 MG tablet Take 1,000 mg by mouth 2 (two) times daily.     Yes Historical Provider, MD  pioglitazone (ACTOS) 15 MG tablet Take 30 mg by mouth daily.    Yes Historical Provider, MD  pravastatin (PRAVACHOL) 40 MG tablet Take 1 tablet by mouth at bedtime.  09/08/12  Yes Historical Provider, MD  predniSONE (DELTASONE) 10 MG tablet Take 10 mg by mouth. tapered dosing pt is on last day 3 tabs, 2tabs for 3 days, 1  tab x 3 days, 1/2 tab for 3 days.   Yes Historical Provider, MD  sitaGLIPtin (JANUVIA) 50 MG tablet Take 50 mg by mouth daily.   Yes Historical Provider, MD  traMADol (ULTRAM) 50 MG tablet Take 50 mg by mouth every 6 (six) hours as needed.   Yes Historical Provider, MD   Physical Exam: Filed Vitals:   04/04/15 0530 04/04/15 0626 04/04/15 0700 04/04/15 0730  BP: 160/97 129/81 127/76 115/61  Pulse: 118 116 110 107  Temp:    99.1 F (37.3 C)  TempSrc:    Oral  Resp: '23 23 27 22  ' Height:      Weight:      SpO2: 95% 94% 93%     Wt  Readings from Last 3 Encounters:  04/04/15 199.583 kg (440 lb)  09/17/14 190.329 kg (419 lb 9.6 oz)  07/27/14 195.047 kg (430 lb)    General:  Appears calm and comfortable Eyes: PERRL, normal lids, irises & conjunctiva ENT: grossly normal hearing, lips & tongue Neck: no LAD, masses or thyromegaly Cardiovascular: RRR, no m/r/g. No LE edema. Telemetry: SR, no arrhythmias  Respiratory: He has poor air movement with no wheezing bilaterally Abdomen: soft, ntnd Skin: no rash or induration seen on limited exam Musculoskeletal: grossly normal tone BUE/BLE Psychiatric: grossly normal mood and affect, speech fluent and appropriate Neurologic: grossly non-focal.          Labs on Admission:  Basic Metabolic Panel:  Recent Labs Lab 04/04/15 0449  NA 137  K 5.2*  CL 92*  CO2 34*  GLUCOSE 305*  BUN 24*  CREATININE 0.84  CALCIUM 9.2   Liver Function Tests: No results for input(s): AST, ALT, ALKPHOS, BILITOT, PROT, ALBUMIN in the last 168 hours. No results for input(s): LIPASE, AMYLASE in the last 168 hours. No results for input(s): AMMONIA in the last 168 hours. CBC:  Recent Labs Lab 04/04/15 0449  WBC 9.0  NEUTROABS 6.4  HGB 14.8  HCT 47.6  MCV 93.5  PLT 188   Cardiac Enzymes:  Recent Labs Lab 04/04/15 0449  TROPONINI <0.03    BNP (last 3 results)  Recent Labs  07/27/14 1722 04/04/15 0449  BNP 34.0 25.0    ProBNP (last 3 results) No results for input(s): PROBNP in the last 8760 hours.  CBG: No results for input(s): GLUCAP in the last 168 hours.  Radiological Exams on Admission: Dg Chest Port 1 View  04/04/2015  CLINICAL DATA:  Dyspnea. EXAM: PORTABLE CHEST 1 VIEW COMPARISON:  03/16/2015 FINDINGS: Study is limited by body habitus. The lungs are grossly clear, without confluent dense consolidation. There is no large effusion or large pneumothorax. Pulmonary vasculature is mildly prominent but unchanged. IMPRESSION: Limited study.  The lungs are grossly  clear. Electronically Signed   By: Andreas Newport M.D.   On: 04/04/2015 05:51    EKG: Independently reviewed. Normal sinus rhythm normal axis normal intervals no ST segment changes.  Assessment/Plan Acute respiratory failure with hypoxia (HCC) due to   COPD with acute exacerbation ([place]): I agree to admit him to step down, started on IV empiric antibiotics, high-dose steroids and inhaler scheduled. Continue to smoke 2 packs per day, and his morbid obesity is probably contributing to his hypoxia, question he if he has obesity hypoventilation syndrome. We'll place her nothing by mouth start IV fluids and monitor sats continuously. He has no leukocytosis no fever chills mild tachycardia and hypoxia unlikely sepsis.  Fairly controlled Diabetes ([place]) mellitus type II not on  insulin: DC all of his oral hypoglycemic agents, his last A1c was 2 years ago and it was 7.3, hemoglobin steroids and his insulin requirements will be higher. We'll start him on long-acting insulin plus sliding-scale check CBGs every 4 hours as the patient is nothing by mouth.  Obstructive sleep apnea/obesity hypoventilation syndrome: Counseling.  Morbid obesity due to excess calories (HCC) Counseling.  Hyperkalemia: Likely prerenal was started on aggressive IV fluid hydration and recheck a be met in the morning.  Code Status: full DVT Prophylaxis:heparin Family Communication: wife Disposition Plan: inpatient  Time spent: 107 min  Charlynne Cousins [facility] Pager [phone]

## 2015-04-04 NOTE — Progress Notes (Signed)
**Note De-identified  Obfuscation** Patient removed from BIPAP and placed on 2.5 L [place]; tolerating well.   RRT to continue to monitor.   

## 2015-04-04 NOTE — Progress Notes (Addendum)
Patient wears CPAP at home pressure 16 , as of 2011 DR.Clance. If not changed??  Have set BiPAP 14 /16 optimal pressure 17. FiO2 is 30 saturation 96.   Patient weight at this time was 363 lbs.

## 2015-04-04 NOTE — Progress Notes (Signed)
Physician called regarding patients request to have food. Patient may have ice chips, but not food per physician.

## 2015-04-04 NOTE — Progress Notes (Signed)
CRITICAL VALUE ALERT  Critical value received:  ABG results  Date of notification:  04/04/2015  Time of notification:  1220  Critical value read back:yes  Nurse who received alert:  Alisia FerrariErica Spangler  MD notified (1st page):    Time of first page:  1220  MD notified (2nd page):Feliz Robb Matarrtiz  Time of second page:  Responding MD:    Time MD responded:  One floor at 1226

## 2015-04-04 NOTE — Progress Notes (Signed)
Patient confused, restless and somewhat agitated. Wife at bedside. Dr. David StallFeliz Ortiz notified.

## 2015-04-04 NOTE — Progress Notes (Signed)
Patient O2 sat on 2.5 liters dropped to 72% when patient sat up on side of bed. O2 increased to 3 liters by [place] with little Improvement. O2 on at 4 liters by [place] at this time 88%. Will reapply bipap in 15 minutes as patient had some ice chips.

## 2015-04-04 NOTE — ED Notes (Signed)
Patient placed on 6 liter of oxygen via Nasal Cannula until respiratory arrived at bedside with c-pap.

## 2015-04-04 NOTE — ED Notes (Signed)
Pt states increased in SOB over the past 3 days. Has been using neb at home w/o relief. Pt states a dry cough also.

## 2015-04-04 NOTE — ED Provider Notes (Signed)
CSN: [id number]     Arrival date & time 04/04/15  0432 History   First MD Initiated Contact with Patient 04/04/15 [phone]     Chief Complaint  Patient presents with  . Shortness of Breath     (Consider location/radiation/quality/duration/timing/severity/associated sxs/prior Treatment) HPI Comments: Pt is a morbidly obese male with hx of asthma, COPD and a history of sleep apnea now presents with 3 days history of increasing dyspnea, cough and wheezing. Pt reports that symptoms got worse at night and he was unable to sleep. Pt has no chest pain. Pt has a cough - dry. He is a smoker. He also admits to wheezing. 5 rounds of breathing treatments yday, with transient and minimal relief. Pt has no hx of intubation. Denies chest pain. No hx of CHF. Denies any hx of PE.  Pt arrives with O2 sats in the 70s on room air.   ROS 10 Systems reviewed and are negative for acute change except as noted in the HPI.     Patient is a 55 y.o. male presenting with shortness of breath. The history is provided by the patient.  Shortness of Breath   Past Medical History  Diagnosis Date  . Hyperlipidemia   . Type II or unspecified type diabetes mellitus without mention of complication, not stated as uncontrolled   . Asthma   . COPD (chronic obstructive pulmonary disease) (HCC)   . Morbid obesity due to excess calories (HCC) 07/27/2014   Past Surgical History  Procedure Laterality Date  . Shoulder surgery      right  . Knee surgery      right  . Tonsillectomy     Family History  Problem Relation Age of Onset  . Asthma Sister   . Heart attack Mother    Social History  Substance Use Topics  . Smoking status: Current Every Day Smoker -- 1.00 packs/day for 34 years    Types: Cigarettes  . Smokeless tobacco: Never Used     Comment: started at age 29.   Marland Kitchen Alcohol Use: No    Review of Systems  Respiratory: Positive for shortness of breath.       Allergies  Review of patient's allergies  indicates no known allergies.  Home Medications   Prior to Admission medications   Medication Sig Start Date End Date Taking? Authorizing Provider  albuterol (PROAIR HFA) 108 (90 BASE) MCG/ACT inhaler Inhale 2 puffs into the lungs every 6 (six) hours as needed for wheezing or shortness of breath.   Yes Historical Provider, MD  budesonide-formoterol (SYMBICORT) 160-4.5 MCG/ACT inhaler Inhale 2 puffs into the lungs 2 (two) times daily.   Yes Historical Provider, MD  glimepiride (AMARYL) 4 MG tablet Take 4 mg by mouth daily before breakfast.   Yes Historical Provider, MD  lisinopril (PRINIVIL,ZESTRIL) 20 MG tablet Take 20 mg by mouth daily.     Yes Historical Provider, MD  metFORMIN (GLUCOPHAGE) 1000 MG tablet Take 1,000 mg by mouth 2 (two) times daily.     Yes Historical Provider, MD  pioglitazone (ACTOS) 15 MG tablet Take 30 mg by mouth daily.    Yes Historical Provider, MD  pravastatin (PRAVACHOL) 40 MG tablet Take 1 tablet by mouth at bedtime.  09/08/12  Yes Historical Provider, MD  predniSONE (DELTASONE) 10 MG tablet Take 10 mg by mouth. tapered dosing pt is on last day 3 tabs, 2tabs for 3 days, 1 tab x 3 days, 1/2 tab for 3 days.   Yes Historical Provider, MD  sitaGLIPtin (JANUVIA) 50 MG tablet Take 50 mg by mouth daily.   Yes Historical Provider, MD  traMADol (ULTRAM) 50 MG tablet Take 50 mg by mouth every 6 (six) hours as needed.   Yes Historical Provider, MD   BP 129/81 mmHg  Pulse 116  Temp(Src) 100.1 F (37.8 C) (Oral)  Resp 23  Ht 6' (1.829 m)  Wt 440 lb (199.583 kg)  BMI 59.66 kg/m2  SpO2 94% Physical Exam  Constitutional: He is oriented to person, place, and time. He appears well-developed.  HENT:  Head: Normocephalic and atraumatic.  Eyes: Conjunctivae are normal.  Neck: Neck supple. No JVD present.  Cardiovascular: Normal rate and regular rhythm.   Pulmonary/Chest: He is in respiratory distress. He has wheezes.  Abdominal: Soft. Bowel sounds are normal. He exhibits no  distension. There is no tenderness. There is no rebound and no guarding.  Neurological: He is alert and oriented to person, place, and time.  Skin: Skin is warm.  Nursing note and vitals reviewed.   ED Course  .Critical Care Performed by: Derwood KaplanNANAVATI, Zuri Bradway Authorized by: Derwood KaplanNANAVATI, Cleotilde Spadaccini Total critical care time: 38 minutes Critical care time was exclusive of separately billable procedures and treating other patients. Critical care was necessary to treat or prevent imminent or life-threatening deterioration of the following conditions: respiratory failure and sepsis. Critical care was time spent personally by me on the following activities: development of treatment plan with patient or surrogate, discussions with consultants, evaluation of patient's response to treatment, examination of patient, obtaining history from patient or surrogate, ordering and performing treatments and interventions, ordering and review of laboratory studies, ordering and review of radiographic studies, pulse oximetry, re-evaluation of patient's condition, review of old charts and ventilator management. Subsequent provider of critical care: I assumed direction of critical care for this patient from another provider of my specialty.   (including critical care time) Labs Review Labs Reviewed  CBC WITH DIFFERENTIAL/PLATELET - Abnormal; Notable for the following:    RDW 16.7 (*)    All other components within normal limits  BASIC METABOLIC PANEL - Abnormal; Notable for the following:    Potassium 5.2 (*)    Chloride 92 (*)    CO2 34 (*)    Glucose, Bld 305 (*)    BUN 24 (*)    All other components within normal limits  CULTURE, BLOOD (ROUTINE X 2)  CULTURE, BLOOD (ROUTINE X 2)  BRAIN NATRIURETIC PEPTIDE  TROPONIN I  URINALYSIS, ROUTINE W REFLEX MICROSCOPIC (NOT AT [facility])  I-STAT CG4 LACTIC ACID, ED    Imaging Review Dg Chest Port 1 View  04/04/2015  CLINICAL DATA:  Dyspnea. EXAM: PORTABLE CHEST 1 VIEW  COMPARISON:  03/16/2015 FINDINGS: Study is limited by body habitus. The lungs are grossly clear, without confluent dense consolidation. There is no large effusion or large pneumothorax. Pulmonary vasculature is mildly prominent but unchanged. IMPRESSION: Limited study.  The lungs are grossly clear. Electronically Signed   By: Ellery Plunkaniel R Mitchell M.D.   On: 04/04/2015 05:51   I have personally reviewed and evaluated these images and lab results as part of my medical decision-making.   EKG Interpretation   Date/Time:  Saturday April 04 2015 04:51:02 EDT Ventricular Rate:  115 PR Interval:  147 QRS Duration: 82 QT Interval:  282 QTC Calculation: 390 R Axis:   89 Text Interpretation:  Sinus tachycardia Low voltage, precordial leads  Probable anteroseptal infarct, old No significant change since last  tracing Confirmed by Rhunette CroftNANAVATI, MD, Janey GentaANKIT [phone]([id number]) on  04/04/2015 5:12:29 AM      MDM   Final diagnoses:  Acute on chronic respiratory failure with hypoxia (HCC)  COPD with exacerbation (HCC)  Severe sepsis (HCC)    Pt comes in with cc of DIB. Pt is acute hypoxic respiratory failure due to COPD. His vitals however reveals temp of > 100 as well. Again - most likely we think that patient has a viral syndrome that has caused acute hypoxic resp failure - but with exam and CXR being severely limited due to body habitus and pretty severe presentation - requiring bipap - we will activate code sepsis and started gentle fluid hydration with CAP antibiotics. Pt was going to get azithromycin anyways - will just add ceftriaxone. Pt will be admitted to step down.  :00 - pt made aware of the results thus far and the plan. He feels better. O2 sats at 94% on bipap . Pt will need aggressive pulm toilet. For now, pre-test probability of copd exacerbation or even a cap is way higher than PE.      Derwood Kaplan, MD 04/04/15 [phone]

## 2015-04-05 LAB — GLUCOSE, CAPILLARY
GLUCOSE-CAPILLARY: 205 mg/dL — AB (ref 65–99)
GLUCOSE-CAPILLARY: 309 mg/dL — AB (ref 65–99)
GLUCOSE-CAPILLARY: 238 mg/dL — AB (ref 65–99)
Glucose-Capillary: 186 mg/dL — ABNORMAL HIGH (ref 65–99)
Glucose-Capillary: 206 mg/dL — ABNORMAL HIGH (ref 65–99)
Glucose-Capillary: 270 mg/dL — ABNORMAL HIGH (ref 65–99)

## 2015-04-05 LAB — CBC
HEMATOCRIT: 46.8 % (ref 39.0–52.0)
HEMOGLOBIN: 14 g/dL (ref 13.0–17.0)
MCH: 28.1 pg (ref 26.0–34.0)
MCHC: 29.9 g/dL — AB (ref 30.0–36.0)
MCV: 94 fL (ref 78.0–100.0)
Platelets: 156 10*3/uL (ref 150–400)
RBC: 4.98 MIL/uL (ref 4.22–5.81)
RDW: 16.9 % — ABNORMAL HIGH (ref 11.5–15.5)
WBC: 9.7 10*3/uL (ref 4.0–10.5)

## 2015-04-05 LAB — COMPREHENSIVE METABOLIC PANEL
ALBUMIN: 3.3 g/dL — AB (ref 3.5–5.0)
ALK PHOS: 59 U/L (ref 38–126)
ALT: 57 U/L (ref 17–63)
ANION GAP: 6 (ref 5–15)
AST: 25 U/L (ref 15–41)
BILIRUBIN TOTAL: 0.5 mg/dL (ref 0.3–1.2)
BUN: 20 mg/dL (ref 6–20)
CALCIUM: 8.2 mg/dL — AB (ref 8.9–10.3)
CO2: 34 mmol/L — AB (ref 22–32)
CREATININE: 0.57 mg/dL — AB (ref 0.61–1.24)
Chloride: 97 mmol/L — ABNORMAL LOW (ref 101–111)
GFR calc Af Amer: >60 mL/min (ref >60)
GFR calc non Af Amer: >60 mL/min (ref >60)
GLUCOSE: 218 mg/dL — AB (ref 65–99)
Potassium: 5.5 mmol/L — ABNORMAL HIGH (ref 3.5–5.1)
SODIUM: 137 mmol/L (ref 135–145)
TOTAL PROTEIN: 7.2 g/dL (ref 6.5–8.1)

## 2015-04-05 MED ORDER — INSULIN ASPART 100 UNIT/ML ~~LOC~~ SOLN
0.0000 [IU] | Freq: Every day | SUBCUTANEOUS | Status: DC
  Administered 2015-04-05 – 2015-04-06 (×2): 3 [IU] via SUBCUTANEOUS
  Administered 2015-04-07: 4 [IU] via SUBCUTANEOUS
  Administered 2015-04-08: 3 [IU] via SUBCUTANEOUS
  Administered 2015-04-09: 4 [IU] via SUBCUTANEOUS

## 2015-04-05 MED ORDER — INSULIN ASPART 100 UNIT/ML ~~LOC~~ SOLN: 0.0000 [IU] | Freq: Three times a day (TID) | SUBCUTANEOUS | Status: DC

## 2015-04-05 MED ORDER — HALOPERIDOL LACTATE 5 MG/ML IJ SOLN
2.5000 mg | Freq: Once | INTRAMUSCULAR | Status: AC
  Administered 2015-04-05: 2.5 mg via INTRAVENOUS
  Filled 2015-04-05: qty 1

## 2015-04-05 MED ORDER — INSULIN ASPART 100 UNIT/ML ~~LOC~~ SOLN
0.0000 [IU] | Freq: Three times a day (TID) | SUBCUTANEOUS | Status: DC
  Administered 2015-04-05: 7 [IU] via SUBCUTANEOUS
  Administered 2015-04-06: 11 [IU] via SUBCUTANEOUS
  Administered 2015-04-06: 15 [IU] via SUBCUTANEOUS
  Administered 2015-04-06: 11 [IU] via SUBCUTANEOUS
  Administered 2015-04-07: 15 [IU] via SUBCUTANEOUS
  Administered 2015-04-07: 11 [IU] via SUBCUTANEOUS
  Administered 2015-04-07: 20 [IU] via SUBCUTANEOUS
  Administered 2015-04-08 (×2): 11 [IU] via SUBCUTANEOUS
  Administered 2015-04-08: 15 [IU] via SUBCUTANEOUS
  Administered 2015-04-09: 3 [IU] via SUBCUTANEOUS
  Administered 2015-04-09 (×2): 15 [IU] via SUBCUTANEOUS
  Administered 2015-04-10: 10 [IU] via SUBCUTANEOUS

## 2015-04-05 NOTE — Progress Notes (Signed)
Patient saturation decreased to 77 after sedation given for confusion. Increased oxygen to 54 , BiPAP 20/10  , f 10 . Suspect his PCO2 is very high. Hopefully he will wake up more as time goes on. Will try to titrate oxygen down will climbing Saturation.

## 2015-04-05 NOTE — Progress Notes (Signed)
[facility] ZOX:[id number] DOB: [birth year] DOA: 04/04/2015 PCP: Brett PerchesFAGAN,Brett Curry   Subjective: He says that he is improved since yesterday morning when he came in with acute respiratory distress. He was wheezing. He continues to smoke 2 pack of cigarettes per day. He had to be placed on BiPAP but he is now on nasal cannula oxygen.           Physical Exam: Blood pressure 130/81, pulse 93, temperature 97.9 F (36.6 C), temperature source Axillary, resp. rate 31, height 6' (1.829 m), weight 196.9 kg (434 lb 1.4 oz), SpO2 93 %. He is morbidly obese. However, he does not appear to be in acute respiratory distress. Lung fields show bilateral scattered wheezing with a moderately tight chest. He is alert and orientated.   Investigations:  Recent Results (from the past 240 hour(s))  Blood Culture (routine x 2)     Status: None (Preliminary result)   Collection Time: 04/04/15  6:48 AM  Result Value Ref Range Status   Specimen Description BLOOD RIGHT HAND  Final   Special Requests BOTTLES DRAWN AEROBIC AND ANAEROBIC 6CC  Final   Culture NO GROWTH 1 DAY  Final   Report Status PENDING  Incomplete  Blood Culture (routine x 2)     Status: None (Preliminary result)   Collection Time: 04/04/15  6:49 AM  Result Value Ref Range Status   Specimen Description BLOOD RIGHT ARM  Final   Special Requests BOTTLES DRAWN AEROBIC AND ANAEROBIC 6CC  Final   Culture NO GROWTH 1 DAY  Final   Report Status PENDING  Incomplete  MRSA PCR Screening     Status: None   Collection Time: 04/04/15  9:45 AM  Result Value Ref Range Status   MRSA by PCR NEGATIVE NEGATIVE Final    Comment:        The GeneXpert MRSA Assay (FDA approved for NASAL specimens only), is one component of a comprehensive MRSA colonization surveillance program. It is not intended to diagnose MRSA infection nor to guide or monitor treatment for MRSA infections.      Basic Metabolic Panel:  Recent Labs  81/19/1403/18/17 0449  04/05/15 0512  NA 137 137  K 5.2* 5.5*  CL 92* 97*  CO2 34* 34*  GLUCOSE 305* 218*  BUN 24* 20  CREATININE 0.84 0.57*  CALCIUM 9.2 8.2*   Liver Function Tests:  Recent Labs  04/05/15 0512  AST 25  ALT 57  ALKPHOS 59  BILITOT 0.5  PROT 7.2  ALBUMIN 3.3*     CBC:  Recent Labs  04/04/15 0449 04/05/15 0512  WBC 9.0 9.7  NEUTROABS 6.4  --   HGB 14.8 14.0  HCT 47.6 46.8  MCV 93.5 94.0  PLT 188 156    Dg Chest Port 1 View  04/04/2015  CLINICAL DATA:  Dyspnea. EXAM: PORTABLE CHEST 1 VIEW COMPARISON:  03/16/2015 FINDINGS: Study is limited by body habitus. The lungs are grossly clear, without confluent dense consolidation. There is no large effusion or large pneumothorax. Pulmonary vasculature is mildly prominent but unchanged. IMPRESSION: Limited study.  The lungs are grossly clear. Electronically Signed   By: Ellery Plunkaniel R Mitchell M.D.   On: 04/04/2015 05:51      Medications: I have reviewed the patient's current medications.  Impression: 1. COPD exacerbation. 2. Morbid obesity. 3. Possible obstructive sleep apnea. 4. Diabetes. 5. Hyperkalemia.     Plan: 1. Discontinue doxycycline. I do not think he warrants antibiotics at this stage. 2. Continue steroids. 3. Monitor  potassium closely. He may require change of his ACE inhibitor.  Consultants:  None.   Procedures:  None.   Antibiotics:  None.                   Code Status: Full code.  Family Communication: I discussed the plan with the patient at the bedside.   Disposition Plan: Home when medically stable.  Time spent: 20 minutes.   LOS: 1 day   GOSRANI,NIMISH C   04/05/2015, 9:03 AM

## 2015-04-05 NOTE — Plan of Care (Signed)
Problem: Activity: Goal: Ability to implement measures to reduce episodes of fatigue will improve Outcome: Progressing FREQUENT REST PERIODS Goal: Will verbalize the importance of balancing activity with adequate rest periods Outcome: Progressing PT UNDERSTANDS NEED TO LIMIT ACTIVITY TO MAINTAIN ADEQUATE OXYGENATION

## 2015-04-05 NOTE — Progress Notes (Signed)
Patient BiPAP changed to 18/8  FiO2 28 , f 8.

## 2015-04-06 LAB — HEMOGLOBIN A1C
HEMOGLOBIN A1C: 9.6 % — AB (ref 4.8–5.6)
MEAN PLASMA GLUCOSE: 229 mg/dL

## 2015-04-06 LAB — GLUCOSE, CAPILLARY
GLUCOSE-CAPILLARY: 275 mg/dL — AB (ref 65–99)
GLUCOSE-CAPILLARY: 287 mg/dL — AB (ref 65–99)
Glucose-Capillary: 293 mg/dL — ABNORMAL HIGH (ref 65–99)
Glucose-Capillary: 345 mg/dL — ABNORMAL HIGH (ref 65–99)

## 2015-04-06 LAB — BASIC METABOLIC PANEL
Anion gap: 5 (ref 5–15)
BUN: 23 mg/dL — AB (ref 6–20)
CHLORIDE: 94 mmol/L — AB (ref 101–111)
CO2: 38 mmol/L — ABNORMAL HIGH (ref 22–32)
Calcium: 8.7 mg/dL — ABNORMAL LOW (ref 8.9–10.3)
Creatinine, Ser: 0.67 mg/dL (ref 0.61–1.24)
GFR calc non Af Amer: >60 mL/min (ref >60)
Glucose, Bld: 272 mg/dL — ABNORMAL HIGH (ref 65–99)
POTASSIUM: 5.4 mmol/L — AB (ref 3.5–5.1)
SODIUM: 137 mmol/L (ref 135–145)

## 2015-04-06 MED ORDER — PROMETHAZINE HCL 25 MG/ML IJ SOLN
25.0000 mg | INTRAMUSCULAR | Status: DC | PRN
  Administered 2015-04-06: 25 mg via INTRAVENOUS
  Filled 2015-04-06: qty 1

## 2015-04-06 NOTE — Plan of Care (Signed)
Problem: Respiratory: Goal: Ability to maintain a clear airway will improve Outcome: Progressing PT IS TOLERATING BEING OFF BIPAP MORE DURING THE DAY.

## 2015-04-06 NOTE — Progress Notes (Signed)
Inpatient Diabetes Program Recommendations  AACE/ADA: New Consensus Statement on Inpatient Glycemic Control (2015)  Target Ranges:  Prepandial:   less than 140 mg/dL      Peak postprandial:   less than 180 mg/dL (1-2 hours)      Critically ill patients:  140 - 180 mg/dL  Results for Brett BondsDURHAM, Ramonte P (MRN [id number]) as of 04/06/2015 08:45  Ref. Range 04/05/2015 07:28 04/05/2015 11:03 04/05/2015 16:34 04/05/2015 21:11 04/06/2015 07:44  Glucose-Capillary Latest Ref Range: 65-99 mg/dL 086205 (H) 578309 (H) 469238 (H) 270 (H) 275 (H)   Review of Glycemic Control  Diabetes history: DM2 Outpatient Diabetes medications: Amaryl 4 mg QAM, Metformin 1000 mg BID, Actos 30 mg daily Current orders for Inpatient glycemic control: Levemir 10 units BID, Novolog 0-20 units TID with meals, Novolog 0-5 unit sQHS  Inpatient Diabetes Program Recommendations: Insulin - Basal: If steroids are continued as ordered, please consider increasing Levemir to 15 units BID. Insulin - Meal Coverage: If steroids are continued as ordered, please consider ordering Novolog 5 units TID with meals for meal coverage if patient eats at least 50% of meals. HgbA1C: A1C in process.  Thanks, Orlando PennerMarie Nysha Koplin, RN, MSN, CDE Diabetes Coordinator Inpatient Diabetes Program [phone] (Team Pager from 8am to 5pm) [phone] ([facility] office) [phone] [facility] office) [phone] [facility] office)

## 2015-04-06 NOTE — Progress Notes (Signed)
Subjective: Brett Curry states that his breathing has improved. He was placed on BiPAP again early this morning due to dropping oxygen saturations. Oxygen saturation now is 94% on BiPAP. Heart rate is 90. He has had no fever.  Objective: Vital signs in last 24 hours: Filed Vitals:   04/06/15 0251 04/06/15 0300 04/06/15 0400 04/06/15 0500  BP: 143/83 119/71 137/87 101/58  Pulse: 98 92 82 67  Temp:   98.2 F (36.8 C)   TempSrc:   Axillary   Resp: Height:      Weight:    425 lb 7.8 oz (193 kg)  SpO2: 92% 91% 93% 93%   Weight change: -14 lb 5.3 oz (-6.5 kg)  Intake/Output Summary (Last 24 hours) at 04/06/15 0734 Last data filed at 04/05/15 1730  Gross per 24 hour  Intake    970 ml  Output   2700 ml  Net  -1730 ml    Physical Exam: Alert. No distress. Lungs reveal diminished breath sounds. Heart regular with no murmurs. Abdomen obese and nontender. Extremities reveal chronic venous insufficiency changes in the lower legs but no edema now. Skin is wrinkled.  Lab Results:    Results for orders placed or performed during the hospital encounter of 04/04/15 (from the past 24 hour(s))  Glucose, capillary     Status: Abnormal   Collection Time: 04/05/15 11:03 AM  Result Value Ref Range   Glucose-Capillary 309 (H) 65 - 99 mg/dL  Glucose, capillary     Status: Abnormal   Collection Time: 04/05/15  4:34 PM  Result Value Ref Range   Glucose-Capillary 238 (H) 65 - 99 mg/dL   Comment 1 Notify RN    Comment 2 Document in Chart   Glucose, capillary     Status: Abnormal   Collection Time: 04/05/15  9:11 PM  Result Value Ref Range   Glucose-Capillary 270 (H) 65 - 99 mg/dL   Comment 1 Notify RN   Basic metabolic panel     Status: Abnormal   Collection Time: 04/06/15 12:34 AM  Result Value Ref Range   Sodium 137 135 - 145 mmol/L   Potassium 5.4 (H) 3.5 - 5.1 mmol/L   Chloride 94 (L) 101 - 111 mmol/L   CO2 38 (H) 22 - 32 mmol/L   Glucose, Bld 272 (H) 65 - 99 mg/dL   BUN 23 (H)  6 - 20 mg/dL   Creatinine, Ser 1.91 0.61 - 1.24 mg/dL   Calcium 8.7 (L) 8.9 - 10.3 mg/dL   GFR calc non Af Amer >60 >60 mL/min   GFR calc Af Amer >60 >60 mL/min   Anion gap 5 5 - 15     ABGS  Recent Labs  04/04/15 1200  PHART 7.211*  PO2ART 85.3  HCO3 25.2*   CULTURES Recent Results (from the past 240 hour(s))  Blood Culture (routine x 2)     Status: None (Preliminary result)   Collection Time: 04/04/15  6:48 AM  Result Value Ref Range Status   Specimen Description BLOOD RIGHT HAND  Final   Special Requests BOTTLES DRAWN AEROBIC AND ANAEROBIC 6CC  Final   Culture NO GROWTH 1 DAY  Final   Report Status PENDING  Incomplete  Blood Culture (routine x 2)     Status: None (Preliminary result)   Collection Time: 04/04/15  6:49 AM  Result Value Ref Range Status   Specimen Description BLOOD RIGHT ARM  Final   Special Requests BOTTLES DRAWN AEROBIC AND  ANAEROBIC 6CC  Final   Culture NO GROWTH 1 DAY  Final   Report Status PENDING  Incomplete  MRSA PCR Screening     Status: None   Collection Time: 04/04/15  9:45 AM  Result Value Ref Range Status   MRSA by PCR NEGATIVE NEGATIVE Final    Comment:        The GeneXpert MRSA Assay (FDA approved for NASAL specimens only), is one component of a comprehensive MRSA colonization surveillance program. It is not intended to diagnose MRSA infection nor to guide or monitor treatment for MRSA infections.    Studies/Results: No results found. Micro Results: Recent Results (from the past 240 hour(s))  Blood Culture (routine x 2)     Status: None (Preliminary result)   Collection Time: 04/04/15  6:48 AM  Result Value Ref Range Status   Specimen Description BLOOD RIGHT HAND  Final   Special Requests BOTTLES DRAWN AEROBIC AND ANAEROBIC 6CC  Final   Culture NO GROWTH 1 DAY  Final   Report Status PENDING  Incomplete  Blood Culture (routine x 2)     Status: None (Preliminary result)   Collection Time: 04/04/15  6:49 AM  Result Value Ref  Range Status   Specimen Description BLOOD RIGHT ARM  Final   Special Requests BOTTLES DRAWN AEROBIC AND ANAEROBIC 6CC  Final   Culture NO GROWTH 1 DAY  Final   Report Status PENDING  Incomplete  MRSA PCR Screening     Status: None   Collection Time: 04/04/15  9:45 AM  Result Value Ref Range Status   MRSA by PCR NEGATIVE NEGATIVE Final    Comment:        The GeneXpert MRSA Assay (FDA approved for NASAL specimens only), is one component of a comprehensive MRSA colonization surveillance program. It is not intended to diagnose MRSA infection nor to guide or monitor treatment for MRSA infections.    Studies/Results: No results found. Medications:  I have reviewed the patient's current medications Scheduled Meds: . albuterol  2.5 mg Nebulization Q6H  . antiseptic oral rinse  7 mL Mouth Rinse q12n4p  . chlorhexidine  15 mL Mouth Rinse BID  . heparin  5,000 Units Subcutaneous 3 times per day  . insulin aspart  0-20 Units Subcutaneous TID WC  . insulin aspart  0-5 Units Subcutaneous QHS  . insulin detemir  10 Units Subcutaneous BID  . methylPREDNISolone (SOLU-MEDROL) injection  40 mg Intravenous Q6H  . mometasone-formoterol  2 puff Inhalation BID  . nicotine  21 mg Transdermal Daily  . pravastatin  40 mg Oral QHS  . tiotropium  18 mcg Inhalation Daily   Continuous Infusions:  PRN Meds:.albuterol, LORazepam, ondansetron **OR** ondansetron (ZOFRAN) IV, polyethylene glycol, traMADol   Assessment/Plan: #1. Acute on chronic hypoxemic respiratory failure. Improving. Continue BiPAP. Continued nebulizers and Solu-Medrol. #2. Hyperkalemia. Stop lisinopril. Potassium 5.4. #3. Diabetes. Continue Levemir and NovoLog. #4. Morbid obesity. He is planning to pursue bariatric surgery. Principal Problem:   Acute respiratory failure with hypoxia (HCC) Active Problems:   Diabetes (HCC)   Obstructive sleep apnea   Morbid obesity due to excess calories (HCC)   COPD with acute exacerbation  (HCC)   Hyperkalemia     LOS: 2 days   Brett Curry 04/06/2015, 7:34 AM

## 2015-04-07 LAB — BASIC METABOLIC PANEL
Anion gap: 8 (ref 5–15)
BUN: 24 mg/dL — AB (ref 6–20)
CALCIUM: 9.4 mg/dL (ref 8.9–10.3)
CO2: 41 mmol/L — ABNORMAL HIGH (ref 22–32)
Chloride: 88 mmol/L — ABNORMAL LOW (ref 101–111)
Creatinine, Ser: 0.61 mg/dL (ref 0.61–1.24)
GFR calc Af Amer: >60 mL/min (ref >60)
GLUCOSE: 315 mg/dL — AB (ref 65–99)
Potassium: 5.2 mmol/L — ABNORMAL HIGH (ref 3.5–5.1)
Sodium: 137 mmol/L (ref 135–145)

## 2015-04-07 LAB — GLUCOSE, CAPILLARY
GLUCOSE-CAPILLARY: 353 mg/dL — AB (ref 65–99)
GLUCOSE-CAPILLARY: 304 mg/dL — AB (ref 65–99)
Glucose-Capillary: 322 mg/dL — ABNORMAL HIGH (ref 65–99)
Glucose-Capillary: 264 mg/dL — ABNORMAL HIGH (ref 65–99)

## 2015-04-07 MED ORDER — INSULIN DETEMIR 100 UNIT/ML ~~LOC~~ SOLN
14.0000 [IU] | Freq: Two times a day (BID) | SUBCUTANEOUS | Status: DC
  Administered 2015-04-07 (×2): 14 [IU] via SUBCUTANEOUS
  Filled 2015-04-07 (×3): qty 0.14

## 2015-04-07 MED ORDER — METHYLPREDNISOLONE SODIUM SUCC 40 MG IJ SOLR
40.0000 mg | Freq: Three times a day (TID) | INTRAMUSCULAR | Status: DC
  Administered 2015-04-07 – 2015-04-10 (×9): 40 mg via INTRAVENOUS
  Filled 2015-04-07 (×9): qty 1

## 2015-04-07 NOTE — Progress Notes (Signed)
Subjective: Ryu again feels that his breathing is improved today. He has been on BiPAP overnight. He tolerated being off of BiPAP during the day yesterday. Oxygen saturation now is 95%. Heart rate is 75. No fever. He experienced vomiting again last night which has now resolved.  Objective: Vital signs in last 24 hours: Filed Vitals:   04/07/15 0400 04/07/15 0500 04/07/15 0600 04/07/15 0700  BP: 133/74 125/74 142/84 138/93  Pulse: 92 72 96 97  Temp: 98.7 F (37.1 C)     TempSrc:      Resp: Height:      Weight:  416 lb 10.7 oz (189 kg)    SpO2: 91% 91% 91% 92%   Weight change: -8 lb 13.1 oz (-4 kg)  Intake/Output Summary (Last 24 hours) at 04/07/15 0738 Last data filed at 04/06/15 2059  Gross per 24 hour  Intake    960 ml  Output   1875 ml  Net   -915 ml    Physical Exam: Alert. Lungs reveal diminished breath sounds. Heart regular with no murmurs. Abdomen is soft and nontender. Extremities reveal no edema.  Lab Results:    Results for orders placed or performed during the hospital encounter of 04/04/15 (from the past 24 hour(s))  Glucose, capillary     Status: Abnormal   Collection Time: 04/06/15  7:44 AM  Result Value Ref Range   Glucose-Capillary 275 (H) 65 - 99 mg/dL   Comment 1 Notify RN    Comment 2 Document in Chart   Glucose, capillary     Status: Abnormal   Collection Time: 04/06/15 12:00 PM  Result Value Ref Range   Glucose-Capillary 293 (H) 65 - 99 mg/dL   Comment 1 Notify RN    Comment 2 Document in Chart   Glucose, capillary     Status: Abnormal   Collection Time: 04/06/15  4:25 PM  Result Value Ref Range   Glucose-Capillary 345 (H) 65 - 99 mg/dL   Comment 1 Notify RN    Comment 2 Document in Chart   Glucose, capillary     Status: Abnormal   Collection Time: 04/06/15  9:17 PM  Result Value Ref Range   Glucose-Capillary 287 (H) 65 - 99 mg/dL  Basic metabolic panel     Status: Abnormal   Collection Time: 04/07/15  4:37 AM  Result Value  Ref Range   Sodium 137 135 - 145 mmol/L   Potassium 5.2 (H) 3.5 - 5.1 mmol/L   Chloride 88 (L) 101 - 111 mmol/L   CO2 41 (H) 22 - 32 mmol/L   Glucose, Bld 315 (H) 65 - 99 mg/dL   BUN 24 (H) 6 - 20 mg/dL   Creatinine, Ser 1.61 0.61 - 1.24 mg/dL   Calcium 9.4 8.9 - 09.6 mg/dL   GFR calc non Af Amer >60 >60 mL/min   GFR calc Af Amer >60 >60 mL/min   Anion gap 8 5 - 15     ABGS  Recent Labs  04/04/15 1200  PHART 7.211*  PO2ART 85.3  HCO3 25.2*   CULTURES Recent Results (from the past 240 hour(s))  Blood Culture (routine x 2)     Status: None (Preliminary result)   Collection Time: 04/04/15  6:48 AM  Result Value Ref Range Status   Specimen Description BLOOD RIGHT HAND  Final   Special Requests BOTTLES DRAWN AEROBIC AND ANAEROBIC 6CC  Final   Culture NO GROWTH 2 DAYS  Final  Report Status PENDING  Incomplete  Blood Culture (routine x 2)     Status: None (Preliminary result)   Collection Time: 04/04/15  6:49 AM  Result Value Ref Range Status   Specimen Description BLOOD RIGHT ARM  Final   Special Requests BOTTLES DRAWN AEROBIC AND ANAEROBIC 6CC  Final   Culture NO GROWTH 2 DAYS  Final   Report Status PENDING  Incomplete  MRSA PCR Screening     Status: None   Collection Time: 04/04/15  9:45 AM  Result Value Ref Range Status   MRSA by PCR NEGATIVE NEGATIVE Final    Comment:        The GeneXpert MRSA Assay (FDA approved for NASAL specimens only), is one component of a comprehensive MRSA colonization surveillance program. It is not intended to diagnose MRSA infection nor to guide or monitor treatment for MRSA infections.    Studies/Results: No results found. Micro Results: Recent Results (from the past 240 hour(s))  Blood Culture (routine x 2)     Status: None (Preliminary result)   Collection Time: 04/04/15  6:48 AM  Result Value Ref Range Status   Specimen Description BLOOD RIGHT HAND  Final   Special Requests BOTTLES DRAWN AEROBIC AND ANAEROBIC 6CC  Final    Culture NO GROWTH 2 DAYS  Final   Report Status PENDING  Incomplete  Blood Culture (routine x 2)     Status: None (Preliminary result)   Collection Time: 04/04/15  6:49 AM  Result Value Ref Range Status   Specimen Description BLOOD RIGHT ARM  Final   Special Requests BOTTLES DRAWN AEROBIC AND ANAEROBIC 6CC  Final   Culture NO GROWTH 2 DAYS  Final   Report Status PENDING  Incomplete  MRSA PCR Screening     Status: None   Collection Time: 04/04/15  9:45 AM  Result Value Ref Range Status   MRSA by PCR NEGATIVE NEGATIVE Final    Comment:        The GeneXpert MRSA Assay (FDA approved for NASAL specimens only), is one component of a comprehensive MRSA colonization surveillance program. It is not intended to diagnose MRSA infection nor to guide or monitor treatment for MRSA infections.    Studies/Results: No results found. Medications:  I have reviewed the patient's current medications Scheduled Meds: . albuterol  2.5 mg Nebulization Q6H  . antiseptic oral rinse  7 mL Mouth Rinse q12n4p  . chlorhexidine  15 mL Mouth Rinse BID  . heparin  5,000 Units Subcutaneous 3 times per day  . insulin aspart  0-20 Units Subcutaneous TID WC  . insulin aspart  0-5 Units Subcutaneous QHS  . insulin detemir  14 Units Subcutaneous BID  . methylPREDNISolone (SOLU-MEDROL) injection  40 mg Intravenous 3 times per day  . mometasone-formoterol  2 puff Inhalation BID  . nicotine  21 mg Transdermal Daily  . pravastatin  40 mg Oral QHS  . tiotropium  18 mcg Inhalation Daily   Continuous Infusions:  PRN Meds:.albuterol, LORazepam, ondansetron **OR** ondansetron (ZOFRAN) IV, polyethylene glycol, promethazine, traMADol   Assessment/Plan: #1. Acute hypoxemic respiratory failure. Slowly improving. Observe off BiPAP later this morning. Will hope to decrease his oxygen requirement. Continue Solu-Medrol but decrease to 40 mg every 8 hours. Continue nebulizers. #2. Diabetes. Increase Levemir to 14 units twice  a day. Continue NovoLog. #3. Sleep apnea. #4. Hyperkalemia. Improved to 5.2. Principal Problem:   Acute respiratory failure with hypoxia (HCC) Active Problems:   Diabetes (HCC)   Obstructive sleep apnea  Morbid obesity due to excess calories (HCC)   COPD with acute exacerbation (HCC)   Hyperkalemia     LOS: 3 days   Jaston Havens 04/07/2015, 7:38 AM

## 2015-04-08 LAB — GLUCOSE, CAPILLARY
GLUCOSE-CAPILLARY: 342 mg/dL — AB (ref 65–99)
GLUCOSE-CAPILLARY: 300 mg/dL — AB (ref 65–99)
Glucose-Capillary: 296 mg/dL — ABNORMAL HIGH (ref 65–99)
Glucose-Capillary: 292 mg/dL — ABNORMAL HIGH (ref 65–99)

## 2015-04-08 LAB — BLOOD GAS, ARTERIAL
ACID-BASE EXCESS: 18.6 mmol/L — AB (ref 0.0–2.0)
BICARBONATE: 38.6 meq/L — AB (ref 20.0–24.0)
DRAWN BY: 234301
O2 Content: 10 L/min
O2 SAT: 91.4 %
PCO2 ART: 80.1 mmHg — AB (ref 35.0–45.0)
PH ART: 7.368 (ref 7.350–7.450)
pO2, Arterial: 69.8 mmHg — ABNORMAL LOW (ref 80.0–100.0)

## 2015-04-08 MED ORDER — ALBUTEROL SULFATE (2.5 MG/3ML) 0.083% IN NEBU
2.5000 mg | INHALATION_SOLUTION | Freq: Three times a day (TID) | RESPIRATORY_TRACT | Status: DC
  Administered 2015-04-08 – 2015-04-10 (×7): 2.5 mg via RESPIRATORY_TRACT
  Filled 2015-04-08 (×7): qty 3

## 2015-04-08 MED ORDER — INSULIN DETEMIR 100 UNIT/ML ~~LOC~~ SOLN
20.0000 [IU] | Freq: Two times a day (BID) | SUBCUTANEOUS | Status: DC
  Administered 2015-04-08 – 2015-04-09 (×4): 20 [IU] via SUBCUTANEOUS
  Filled 2015-04-08 (×11): qty 0.2

## 2015-04-08 MED ORDER — GLIMEPIRIDE 2 MG PO TABS
4.0000 mg | ORAL_TABLET | Freq: Every day | ORAL | Status: DC
  Administered 2015-04-08 – 2015-04-10 (×3): 4 mg via ORAL
  Filled 2015-04-08 (×3): qty 2

## 2015-04-08 NOTE — Progress Notes (Signed)
CRITICAL VALUE ALERT  Critical value received:  PCO2 80.1  Date of notification:  04/08/15  Time of notification:  0925  Critical value read back:yes  Nurse who received alert:  L.Schonewitiz RN  MD notified (1st page):  Dr. Ouida SillsFagan   Time of first page:  1000  MD notified (2nd page):  Time of second page:  Responding MD:  Dr. Ouida SillsFagan  Time MD responded: 1000

## 2015-04-08 NOTE — Progress Notes (Addendum)
Inpatient Diabetes Program Recommendations  AACE/ADA: New Consensus Statement on Inpatient Glycemic Control (2015)  Target Ranges:  Prepandial:   less than 140 mg/dL      Peak postprandial:   less than 180 mg/dL (1-2 hours)      Critically ill patients:  140 - 180 mg/dL  Results for Narda BondsDURHAM, Kasson P (MRN [id number]) as of 04/08/2015 12:29  Ref. Range 04/07/2015 07:32 04/07/2015 11:44 04/07/2015 16:33 04/07/2015 20:59 04/08/2015 07:43  Glucose-Capillary Latest Ref Range: 65-99 mg/dL 782322 (H) 956353 (H) 213264 (H) 304 (H) 296 (H)   Results for Narda BondsDURHAM, Khalin P (MRN [id number]) as of 04/08/2015 12:29  Ref. Range 04/04/2015 04:49  Hemoglobin A1C Latest Ref Range: 4.8-5.6 % 9.6 (H)   Review of Glycemic Control  Diabetes history: DM2 Outpatient Diabetes medications: Actos 30 mg daily, Amaryl 4 mg QAM, Metformin 1000 mg BID Current orders for Inpatient glycemic control: Levemir 20 units BID, Novolog 0-20 units TID with meals, Novolog 0-5 units QHS, Amaryl 4 mg QAM  Inpatient Diabetes Program Recommendations: Insulin - Basal: Noted Lantus was increased from 14 units BID to 20 units BID. Insulin - Meal Coverage: If steroids are continued as ordered (Solumedrol Please consider ordering Novolog 5 units TID with meals for meal coverage. HgbA1C: 9.6% on 04/04/2015 indicating an average glucose of 229 mg/dl over the past 2-3 months.   04/08/15@14 :00-Spoke with patient about diabetes and home regimen for diabetes control. Patient reports that he is followed by PCP for diabetes management and currently he takes Amaryl 4 mg QAM, Metformin 1000 mg BID, and Actos 30 mg daily as an outpatient for diabetes control. Patient reports that he is taking DM medications as prescribed. Patient states that he does not check his glucose and does not have any testing supplies at home of his own to check his glucose.  Discussed A1C results (9.6% on 04/04/2014) and explained that his current A1C indicates an average glucose of 229 mg/dl over  the past 2-3 months. Discussed glucose and A1C goals. Discussed importance of checking CBGs and maintaining good CBG control to prevent long-term and short-term complications. Explained how hyperglycemia leads to damage within blood vessels which lead to the common complications seen with uncontrolled diabetes. Stressed to the patient the importance of improving glycemic control to prevent further complications from uncontrolled diabetes. Discussed impact of nutrition, exercise, stress, sickness, and medications on diabetes control.Note that patient takes steroids chronically periodically and he is currently receiving steroids as an inpatient which is contributing to hyperglycemia.  Patient states that he does not follow a Carb Modified diet but does report that he is not drinking sugary drinks. Discussed carbohydrates, carbohydrate goals per day and meal, along with portion sizes. Patient reports that he knows what he should be eating but just hasn't been following a carb modified diet.  Encouraged patient to follow Carb Modified diet. Patient reports that he is planning to have gastric bypass surgery in July which he hopes will help him lose weight and get diabetes better controlled. Informed patient that a request would be made for Dr. Ouida SillsFagan to provide him with a prescription for a glucometer and testing supplies at time of discharge.  Encouraged patient to check his glucose at least once a day and to keep a log book of glucose readings which he will need to take to doctor appointments.  Patient verbalized understanding of information discussed and he states that he has no further questions at this time related to diabetes.  Thanks, Orlando PennerMarie Liandra Mendia, RN, MSN,  CDE Diabetes Coordinator Inpatient Diabetes Program [phone] (Team Pager from 8am to 5pm) [phone] ([facility] office) [phone] [facility] office) [phone] [facility] office)'

## 2015-04-08 NOTE — Progress Notes (Signed)
Subjective: He has had no further vomiting. He has had some periods of confusion. Oxygen saturations remained satisfactory on BiPAP last night. He was able to be up in the chair yesterday.  Objective: Vital signs in last 24 hours: Filed Vitals:   04/08/15 0000 04/08/15 0100 04/08/15 0400 04/08/15 0500  BP:  138/84 141/89 143/96  Pulse: 79  92 105  Temp: 97.7 F (36.5 C)  98.7 F (37.1 C)   TempSrc: Axillary  Axillary   Resp:   14 19  Height:      Weight:   414 lb 0.4 oz (187.8 kg)   SpO2: 96%  95% 94%   Weight change: -2 lb 10.3 oz (-1.2 kg)  Intake/Output Summary (Last 24 hours) at 04/08/15 0738 Last data filed at 04/08/15 0500  Gross per 24 hour  Intake    840 ml  Output   2575 ml  Net  -1735 ml    Physical Exam: Alert but mildly confused. Lungs reveal diminished breath sounds. Heart tachycardic in the 100-110 range now. Abdomen soft and nontender. Extremities reveal no edema.  Lab Results:    Results for orders placed or performed during the hospital encounter of 04/04/15 (from the past 24 hour(s))  Glucose, capillary     Status: Abnormal   Collection Time: 04/07/15 11:44 AM  Result Value Ref Range   Glucose-Capillary 353 (H) 65 - 99 mg/dL  Glucose, capillary     Status: Abnormal   Collection Time: 04/07/15  4:33 PM  Result Value Ref Range   Glucose-Capillary 264 (H) 65 - 99 mg/dL   Comment 1 Notify RN    Comment 2 Document in Chart   Glucose, capillary     Status: Abnormal   Collection Time: 04/07/15  8:59 PM  Result Value Ref Range   Glucose-Capillary 304 (H) 65 - 99 mg/dL     ABGS No results for input(s): PHART, PO2ART, TCO2, HCO3 in the last 72 hours.  Invalid input(s): PCO2 CULTURES Recent Results (from the past 240 hour(s))  Blood Culture (routine x 2)     Status: None (Preliminary result)   Collection Time: 04/04/15  6:48 AM  Result Value Ref Range Status   Specimen Description BLOOD RIGHT HAND  Final   Special Requests BOTTLES DRAWN AEROBIC AND  ANAEROBIC 6CC  Final   Culture NO GROWTH 3 DAYS  Final   Report Status PENDING  Incomplete  Blood Culture (routine x 2)     Status: None (Preliminary result)   Collection Time: 04/04/15  6:49 AM  Result Value Ref Range Status   Specimen Description BLOOD RIGHT ARM  Final   Special Requests BOTTLES DRAWN AEROBIC AND ANAEROBIC 6CC  Final   Culture NO GROWTH 3 DAYS  Final   Report Status PENDING  Incomplete  MRSA PCR Screening     Status: None   Collection Time: 04/04/15  9:45 AM  Result Value Ref Range Status   MRSA by PCR NEGATIVE NEGATIVE Final    Comment:        The GeneXpert MRSA Assay (FDA approved for NASAL specimens only), is one component of a comprehensive MRSA colonization surveillance program. It is not intended to diagnose MRSA infection nor to guide or monitor treatment for MRSA infections.    Studies/Results: No results found. Micro Results: Recent Results (from the past 240 hour(s))  Blood Culture (routine x 2)     Status: None (Preliminary result)   Collection Time: 04/04/15  6:48 AM  Result Value  Ref Range Status   Specimen Description BLOOD RIGHT HAND  Final   Special Requests BOTTLES DRAWN AEROBIC AND ANAEROBIC 6CC  Final   Culture NO GROWTH 3 DAYS  Final   Report Status PENDING  Incomplete  Blood Culture (routine x 2)     Status: None (Preliminary result)   Collection Time: 04/04/15  6:49 AM  Result Value Ref Range Status   Specimen Description BLOOD RIGHT ARM  Final   Special Requests BOTTLES DRAWN AEROBIC AND ANAEROBIC 6CC  Final   Culture NO GROWTH 3 DAYS  Final   Report Status PENDING  Incomplete  MRSA PCR Screening     Status: None   Collection Time: 04/04/15  9:45 AM  Result Value Ref Range Status   MRSA by PCR NEGATIVE NEGATIVE Final    Comment:        The GeneXpert MRSA Assay (FDA approved for NASAL specimens only), is one component of a comprehensive MRSA colonization surveillance program. It is not intended to diagnose MRSA infection  nor to guide or monitor treatment for MRSA infections.    Studies/Results: No results found. Medications:  I have reviewed the patient's current medications Scheduled Meds: . albuterol  2.5 mg Nebulization TID  . antiseptic oral rinse  7 mL Mouth Rinse q12n4p  . chlorhexidine  15 mL Mouth Rinse BID  . glimepiride  4 mg Oral Q breakfast  . heparin  5,000 Units Subcutaneous 3 times per day  . insulin aspart  0-20 Units Subcutaneous TID WC  . insulin aspart  0-5 Units Subcutaneous QHS  . insulin detemir  14 Units Subcutaneous BID  . methylPREDNISolone (SOLU-MEDROL) injection  40 mg Intravenous 3 times per day  . mometasone-formoterol  2 puff Inhalation BID  . nicotine  21 mg Transdermal Daily  . pravastatin  40 mg Oral QHS  . tiotropium  18 mcg Inhalation Daily   Continuous Infusions:  PRN Meds:.albuterol, LORazepam, ondansetron **OR** ondansetron (ZOFRAN) IV, polyethylene glycol, promethazine, traMADol   Assessment/Plan: #1. Acute respiratory failure/COPD. Recheck ABG. Off BiPAP today. #2. Diabetes. Re-add glimepiride. Continue Levemir and NovoLog. #3. Hyperkalemia. Recheck labs tomorrow. Principal Problem:   Acute respiratory failure with hypoxia (HCC) Active Problems:   Diabetes (HCC)   Obstructive sleep apnea   Morbid obesity due to excess calories (HCC)   COPD with acute exacerbation (HCC)   Hyperkalemia     LOS: 4 days   Alicen Donalson 04/08/2015, 7:38 [email or web address] [id number]

## 2015-04-09 LAB — CBC
HEMATOCRIT: 51.4 % (ref 39.0–52.0)
Hemoglobin: 16.1 g/dL (ref 13.0–17.0)
MCH: 28.8 pg (ref 26.0–34.0)
MCHC: 31.3 g/dL (ref 30.0–36.0)
MCV: 91.8 fL (ref 78.0–100.0)
PLATELETS: 175 10*3/uL (ref 150–400)
RBC: 5.6 MIL/uL (ref 4.22–5.81)
RDW: 15.6 % — AB (ref 11.5–15.5)
WBC: 10.4 10*3/uL (ref 4.0–10.5)

## 2015-04-09 LAB — COMPREHENSIVE METABOLIC PANEL
ALT: 41 U/L (ref 17–63)
AST: 14 U/L — AB (ref 15–41)
Albumin: 3.3 g/dL — ABNORMAL LOW (ref 3.5–5.0)
Alkaline Phosphatase: 47 U/L (ref 38–126)
Anion gap: 7 (ref 5–15)
BILIRUBIN TOTAL: 0.7 mg/dL (ref 0.3–1.2)
BUN: 24 mg/dL — AB (ref 6–20)
CO2: 42 mmol/L — ABNORMAL HIGH (ref 22–32)
CREATININE: 0.6 mg/dL — AB (ref 0.61–1.24)
Calcium: 9 mg/dL (ref 8.9–10.3)
Chloride: 89 mmol/L — ABNORMAL LOW (ref 101–111)
Glucose, Bld: 169 mg/dL — ABNORMAL HIGH (ref 65–99)
POTASSIUM: 4.1 mmol/L (ref 3.5–5.1)
Sodium: 138 mmol/L (ref 135–145)
TOTAL PROTEIN: 6.9 g/dL (ref 6.5–8.1)

## 2015-04-09 LAB — CULTURE, BLOOD (ROUTINE X 2)
CULTURE: NO GROWTH
Culture: NO GROWTH

## 2015-04-09 LAB — GLUCOSE, CAPILLARY
GLUCOSE-CAPILLARY: 340 mg/dL — AB (ref 65–99)
Glucose-Capillary: 349 mg/dL — ABNORMAL HIGH (ref 65–99)
Glucose-Capillary: 150 mg/dL — ABNORMAL HIGH (ref 65–99)
Glucose-Capillary: 344 mg/dL — ABNORMAL HIGH (ref 65–99)

## 2015-04-09 NOTE — Progress Notes (Signed)
   04/09/15 0953  What Happened  Was fall witnessed? No  Was patient injured? No  Patient found on floor;other (Comment) (on knees )  Found by Staff-comment (Dianna, Lab Tech notified staff she heard a noise from room )  Stated prior activity bathroom-unassisted  Follow Up  MD notified Dr. Ouida SillsFagan   Time MD notified [phone]  Family notified No- patient refusal  Additional tests No  Progress note created (see row info) Yes  Adult Fall Risk Assessment  Risk Factor Category (scoring not indicated) Fall has occurred during this admission (document High fall risk)  Age 55  Fall History: Fall within 6 months prior to admission 0  Elimination; Bowel and/or Urine Incontinence 2  Elimination; Bowel and/or Urine Urgency/Frequency 2  Medications: includes PCA/Opiates, Anti-convulsants, Anti-hypertensives, Diuretics, Hypnotics, Laxatives, Sedatives, and Psychotropics 3  Patient Care Equipment 3  Mobility-Assistance 2  Mobility-Gait 2  Mobility-Sensory Deficit 0  Cognition-Awareness 0  Cognition-Impulsiveness 2  Cognition-Limitations 0  Total Score 16  Patient's Fall Risk High Fall Risk (>13 points)  Adult Fall Risk Interventions  Required Bundle Interventions *See Row Information* High fall risk - low, moderate, and high requirements implemented  Additional Interventions Individualized elimination schedule;Secure all tubes/drains;Room near nurses station  Fall with Injury Screening  Risk For Fall Injury- See Row Information  Nurse judgement  Intervention(s) for 2 or more risk criteria identified Low Bed  Vitals  Temp 99 F (37.2 C)  Temp Source Oral  BP 121/76 mmHg  Pulse Rate 63  Resp (!) 24  Oxygen Therapy  SpO2 95 %  O2 Device HFNC  O2 Flow Rate (L/min) 10 L/min  Pain Assessment  Pain Assessment No/denies pain  PCA/Epidural/Spinal Assessment  Respiratory Pattern Regular;Dyspnea with exertion  Neurological  Neuro (WDL) WDL  Glasgow Coma Scale  Eye Opening 4  Best Verbal  Response (NON-intubated) 5  Best Motor Response 6  Glasgow Coma Scale Score 15  Musculoskeletal  Musculoskeletal (WDL) WDL  Integumentary  Integumentary (WDL) X  Skin Color Appropriate for ethnicity  Skin Condition Dry  Skin Integrity Intact;Ecchymosis  Ecchymosis Location Leg  Ecchymosis Location Orientation Left;Posterior;Upper  Skin Turgor Non-tenting

## 2015-04-09 NOTE — Progress Notes (Signed)
Subjective: He is awake and alert this morning. He is on 10 L of oxygen by nasal cannula. Oxygen saturation is 91%. Heart rate is 104. No fever. He was to go home.  Objective: Vital signs in last 24 hours: Filed Vitals:   04/09/15 0300 04/09/15 0400 04/09/15 0500 04/09/15 0600  BP: 128/70 137/59 144/86 118/105  Pulse: 88 111 90 96  Temp:  98.6 F (37 C)    TempSrc:  Oral    Resp:  Height:      Weight:    412 lb 11.2 oz (187.2 kg)  SpO2: 92% 97% 93% 94%   Weight change: -1 lb 5.2 oz (-0.6 kg)  Intake/Output Summary (Last 24 hours) at 04/09/15 0738 Last data filed at 04/09/15 0500  Gross per 24 hour  Intake    720 ml  Output   2950 ml  Net  -2230 ml    Physical Exam: Lungs reveal diminished breath sounds. Heart tachycardic with no murmurs. Abdomen nontender. Extremities unchanged.  Lab Results:    Results for orders placed or performed during the hospital encounter of 04/04/15 (from the past 24 hour(s))  Glucose, capillary     Status: Abnormal   Collection Time: 04/08/15  7:43 AM  Result Value Ref Range   Glucose-Capillary 296 (H) 65 - 99 mg/dL   Comment 1 Notify RN    Comment 2 Document in Chart   Blood gas, arterial Once     Status: Abnormal   Collection Time: 04/08/15  9:10 AM  Result Value Ref Range   O2 Content 10.0 L/min   Delivery systems HI FLOW NASAL CANNULA    pH, Arterial 7.368 7.350 - 7.450   pCO2 arterial 80.1 (HH) 35.0 - 45.0 mmHg   pO2, Arterial 69.8 (L) 80.0 - 100.0 mmHg   Bicarbonate 38.6 (H) 20.0 - 24.0 mEq/L   Acid-Base Excess 18.6 (H) 0.0 - 2.0 mmol/L   O2 Saturation 91.4 %   Collection site LEFT RADIAL    Drawn by [id number]    Sample type ARTERIAL    Allens test (pass/fail) PASS PASS  Glucose, capillary     Status: Abnormal   Collection Time: 04/08/15 11:37 AM  Result Value Ref Range   Glucose-Capillary 342 (H) 65 - 99 mg/dL   Comment 1 Notify RN    Comment 2 Document in Chart   Glucose, capillary     Status: Abnormal    Collection Time: 04/08/15  4:58 PM  Result Value Ref Range   Glucose-Capillary 292 (H) 65 - 99 mg/dL   Comment 1 Notify RN    Comment 2 Document in Chart   Glucose, capillary     Status: Abnormal   Collection Time: 04/08/15 10:54 PM  Result Value Ref Range   Glucose-Capillary 300 (H) 65 - 99 mg/dL   Comment 1 Notify RN   Comprehensive metabolic panel     Status: Abnormal   Collection Time: 04/09/15  4:35 AM  Result Value Ref Range   Sodium 138 135 - 145 mmol/L   Potassium 4.1 3.5 - 5.1 mmol/L   Chloride 89 (L) 101 - 111 mmol/L   CO2 42 (H) 22 - 32 mmol/L   Glucose, Bld 169 (H) 65 - 99 mg/dL   BUN 24 (H) 6 - 20 mg/dL   Creatinine, Ser 0.45 (L) 0.61 - 1.24 mg/dL   Calcium 9.0 8.9 - 40.9 mg/dL   Total Protein 6.9 6.5 - 8.1 g/dL   Albumin 3.3 (L)  3.5 - 5.0 g/dL   AST 14 (L) 15 - 41 U/L   ALT 41 17 - 63 U/L   Alkaline Phosphatase 47 38 - 126 U/L   Total Bilirubin 0.7 0.3 - 1.2 mg/dL   GFR calc non Af Amer >60 >60 mL/min   GFR calc Af Amer >60 >60 mL/min   Anion gap 7 5 - 15  CBC     Status: Abnormal   Collection Time: 04/09/15  4:35 AM  Result Value Ref Range   WBC 10.4 4.0 - 10.5 K/uL   RBC 5.60 4.22 - 5.81 MIL/uL   Hemoglobin 16.1 13.0 - 17.0 g/dL   HCT 16.1 09.6 - 04.5 %   MCV 91.8 78.0 - 100.0 fL   MCH 28.8 26.0 - 34.0 pg   MCHC 31.3 30.0 - 36.0 g/dL   RDW 40.9 (H) 81.1 - 91.4 %   Platelets 175 150 - 400 K/uL     ABGS  Recent Labs  04/08/15 0910  PHART 7.368  PO2ART 69.8*  HCO3 38.6*   CULTURES Recent Results (from the past 240 hour(s))  Blood Culture (routine x 2)     Status: None (Preliminary result)   Collection Time: 04/04/15  6:48 AM  Result Value Ref Range Status   Specimen Description BLOOD RIGHT HAND  Final   Special Requests BOTTLES DRAWN AEROBIC AND ANAEROBIC 6CC  Final   Culture NO GROWTH 4 DAYS  Final   Report Status PENDING  Incomplete  Blood Culture (routine x 2)     Status: None (Preliminary result)   Collection Time: 04/04/15  6:49 AM   Result Value Ref Range Status   Specimen Description BLOOD RIGHT ARM  Final   Special Requests BOTTLES DRAWN AEROBIC AND ANAEROBIC 6CC  Final   Culture NO GROWTH 4 DAYS  Final   Report Status PENDING  Incomplete  MRSA PCR Screening     Status: None   Collection Time: 04/04/15  9:45 AM  Result Value Ref Range Status   MRSA by PCR NEGATIVE NEGATIVE Final    Comment:        The GeneXpert MRSA Assay (FDA approved for NASAL specimens only), is one component of a comprehensive MRSA colonization surveillance program. It is not intended to diagnose MRSA infection nor to guide or monitor treatment for MRSA infections.    Studies/Results: No results found. Micro Results: Recent Results (from the past 240 hour(s))  Blood Culture (routine x 2)     Status: None (Preliminary result)   Collection Time: 04/04/15  6:48 AM  Result Value Ref Range Status   Specimen Description BLOOD RIGHT HAND  Final   Special Requests BOTTLES DRAWN AEROBIC AND ANAEROBIC 6CC  Final   Culture NO GROWTH 4 DAYS  Final   Report Status PENDING  Incomplete  Blood Culture (routine x 2)     Status: None (Preliminary result)   Collection Time: 04/04/15  6:49 AM  Result Value Ref Range Status   Specimen Description BLOOD RIGHT ARM  Final   Special Requests BOTTLES DRAWN AEROBIC AND ANAEROBIC 6CC  Final   Culture NO GROWTH 4 DAYS  Final   Report Status PENDING  Incomplete  MRSA PCR Screening     Status: None   Collection Time: 04/04/15  9:45 AM  Result Value Ref Range Status   MRSA by PCR NEGATIVE NEGATIVE Final    Comment:        The GeneXpert MRSA Assay (FDA approved for NASAL specimens only),  is one component of a comprehensive MRSA colonization surveillance program. It is not intended to diagnose MRSA infection nor to guide or monitor treatment for MRSA infections.    Studies/Results: No results found. Medications:  I have reviewed the patient's current medications Scheduled Meds: . albuterol  2.5  mg Nebulization TID  . antiseptic oral rinse  7 mL Mouth Rinse q12n4p  . chlorhexidine  15 mL Mouth Rinse BID  . glimepiride  4 mg Oral Q breakfast  . heparin  5,000 Units Subcutaneous 3 times per day  . insulin aspart  0-20 Units Subcutaneous TID WC  . insulin aspart  0-5 Units Subcutaneous QHS  . insulin detemir  20 Units Subcutaneous BID  . methylPREDNISolone (SOLU-MEDROL) injection  40 mg Intravenous 3 times per day  . mometasone-formoterol  2 puff Inhalation BID  . nicotine  21 mg Transdermal Daily  . pravastatin  40 mg Oral QHS  . tiotropium  18 mcg Inhalation Daily   Continuous Infusions:  PRN Meds:.albuterol, LORazepam, ondansetron **OR** ondansetron (ZOFRAN) IV, polyethylene glycol, promethazine, traMADol   Assessment/Plan: #1. Acute on chronic respiratory failure. PCO2 was 80 on his ABG yesterday. Pulmonary consulted. #2. Diabetes. Improved. Glucose 169. Continue Levemir. Continue glimepiride. #3. Hyperkalemia. Resolved. Potassium is 4.1. #4. Morbid obesity. His wife notes today that he has been through the process for bariatric surgery tentatively planned for July. Principal Problem:   Acute respiratory failure with hypoxia (HCC) Active Problems:   Diabetes (HCC)   Obstructive sleep apnea   Morbid obesity due to excess calories (HCC)   COPD with acute exacerbation (HCC)   Hyperkalemia     LOS: 5 days   Brett Curry 04/09/2015, 7:38 AM

## 2015-04-09 NOTE — Progress Notes (Signed)
Pt a/o.vss. Saline lock patent. 10L/[place] high flow on. No complaints of any distress. Report given to Christiansburghristina, Charity fundraiserN. Pt to be transferred to room 322 via wheelchair with nursing staff.

## 2015-04-09 NOTE — Progress Notes (Signed)
Inpatient Diabetes Program Recommendations  AACE/ADA: New Consensus Statement on Inpatient Glycemic Control (2015)  Target Ranges:  Prepandial:   less than 140 mg/dL      Peak postprandial:   less than 180 mg/dL (1-2 hours)      Critically ill patients:  140 - 180 mg/dL   Review of Glycemic Control  Diabetes history: DM2 Outpatient Diabetes medications: Actos 30 mg daily, Amaryl 4 mg QAM, Metformin 1000 mg BID Current orders for Inpatient glycemic control: Levemir 20 units BID, Novolog 0-20 units TID with meals, Novolog 0-5 units QHS, Amaryl 4 mg QAM  Inpatient Diabetes Program Recommendations: Insulin - Meal Coverage: Patient received a total of Novolog 40 units on 04/08/15 for correction and post prandial glucose is consistently elevated. Please consider ordering Novolog 8 units TID with meals for meal coverage.  Thanks, Orlando PennerMarie Jannette Cotham, RN, MSN, CDE Diabetes Coordinator Inpatient Diabetes Program [phone] (Team Pager from 8am to 5pm) [phone] ([facility] office) [phone] [facility] office) [phone] [facility] office)

## 2015-04-09 NOTE — Progress Notes (Signed)
Pt has fallen. See post fall flow sheet.

## 2015-04-09 NOTE — Consult Note (Signed)
Consult requested by: Dr. Ouida Sills Consult requested for respiratory failure:  HPI: This is a 55 year old who came to the hospital with acute on chronic respiratory failure. He is known to have COPD and sleep apnea/obesity hypoventilation. He is currently being evaluated for potential obesity surgery. He says he has been on CPAP at home and uses it regularly. He has been smoking 2-3 packages of cigarettes until he came to the hospital. He says he is not very active at home so he is not limited a lot by shortness of breath. He does cough some. He has noticed that his legs are chronically swollen and chronically erythematous. He has some cough. In the hospital. He says he feels significantly better. He had a PCO2 of approximately 80 on blood gas and that was well compensated.  Past Medical History  Diagnosis Date  . Hyperlipidemia   . Type II or unspecified type diabetes mellitus without mention of complication, not stated as uncontrolled   . Asthma   . COPD (chronic obstructive pulmonary disease) (HCC)   . Morbid obesity due to excess calories (HCC) 07/27/2014     Family History  Problem Relation Age of Onset  . Asthma Sister   . Heart attack Mother      Social History   Social History  . Marital Status: Married    Spouse Name: N/A  . Number of Children: N/A  . Years of Education: N/A   Occupational History  . truck driver    Social History Main Topics  . Smoking status: Current Every Day Smoker -- 2.00 packs/day for 34 years    Types: Cigarettes  . Smokeless tobacco: Never Used     Comment: started at age 29.   Marland Kitchen Alcohol Use: No  . Drug Use: No  . Sexual Activity: Yes   Other Topics Concern  . None   Social History Narrative     ROS: He denies any chest pain. He says he is not coughing up any sputum. He has been very sleepy. He's had the problem with his legs. Otherwise per the history and physical    Objective: Vital signs in last 24 hours: Temp:  [97.9 F (36.6  C)-98.6 F (37 C)] 97.9 F (36.6 C) (03/23 0818) Pulse Rate:  [77-122] 106 (03/23 0700) Resp:  [14-29] 24 (03/23 0700) BP: (115-159)/(59-108) 150/108 mmHg (03/23 0700) SpO2:  [85 %-100 %] 91 % (03/23 0817) FiO2 (%):  [40 %] 40 % (03/22 1414) Weight:  [187.2 kg (412 lb 11.2 oz)] 187.2 kg (412 lb 11.2 oz) (03/23 0600) Weight change: -0.6 kg (-1 lb 5.2 oz) Last BM Date: 04/08/15  Intake/Output from previous day: 03/22 0701 - 03/23 0700 In: 720 [P.O.:720] Out: 2950 [Urine:2950]  PHYSICAL EXAM He is awake and alert. He is on nasal oxygen at 10 L. He is morbidly obese. His pupils are reactive. Nose and throat are clear. Neck is supple without masses and I can't tell if he has any JVD. His chest is relatively clear with diminished breath sounds bilaterally. His heart is regular without gallop. His abdomen is soft obese without masses. He has what looks like chronic venous stasis changes chronic erythema and some thickening of the skin on his legs bilaterally. Central nervous system examination is grossly intact  Lab Results: Basic Metabolic Panel:  Recent Labs  16/10/96 0437 04/09/15 0435  NA 137 138  K 5.2* 4.1  CL 88* 89*  CO2 41* 42*  GLUCOSE 315* 169*  BUN 24* 24*  CREATININE 0.61 0.60*  CALCIUM 9.4 9.0   Liver Function Tests:  Recent Labs  04/09/15 0435  AST 14*  ALT 41  ALKPHOS 47  BILITOT 0.7  PROT 6.9  ALBUMIN 3.3*   No results for input(s): LIPASE, AMYLASE in the last 72 hours. No results for input(s): AMMONIA in the last 72 hours. CBC:  Recent Labs  04/09/15 0435  WBC 10.4  HGB 16.1  HCT 51.4  MCV 91.8  PLT 175   Cardiac Enzymes: No results for input(s): CKTOTAL, CKMB, CKMBINDEX, TROPONINI in the last 72 hours. BNP: No results for input(s): PROBNP in the last 72 hours. D-Dimer: No results for input(s): DDIMER in the last 72 hours. CBG:  Recent Labs  04/07/15 2059 04/08/15 0743 04/08/15 1137 04/08/15 1658 04/08/15 2254 04/09/15 0745   GLUCAP 304* 296* 342* 292* 300* 349*   Hemoglobin A1C: No results for input(s): HGBA1C in the last 72 hours. Fasting Lipid Panel: No results for input(s): CHOL, HDL, LDLCALC, TRIG, CHOLHDL, LDLDIRECT in the last 72 hours. Thyroid Function Tests: No results for input(s): TSH, T4TOTAL, FREET4, T3FREE, THYROIDAB in the last 72 hours. Anemia Panel: No results for input(s): VITAMINB12, FOLATE, FERRITIN, TIBC, IRON, RETICCTPCT in the last 72 hours. Coagulation: No results for input(s): LABPROT, INR in the last 72 hours. Urine Drug Screen: Drugs of Abuse  No results found for: LABOPIA, COCAINSCRNUR, LABBENZ, AMPHETMU, THCU, LABBARB  Alcohol Level: No results for input(s): ETH in the last 72 hours. Urinalysis: No results for input(s): COLORURINE, LABSPEC, PHURINE, GLUCOSEU, HGBUR, BILIRUBINUR, KETONESUR, PROTEINUR, UROBILINOGEN, NITRITE, LEUKOCYTESUR in the last 72 hours.  Invalid input(s): APPERANCEUR Misc. Labs:   ABGS:  Recent Labs  04/08/15 0910  PHART 7.368  PO2ART 69.8*  HCO3 38.6*     MICROBIOLOGY: Recent Results (from the past 240 hour(s))  Blood Culture (routine x 2)     Status: None (Preliminary result)   Collection Time: 04/04/15  6:48 AM  Result Value Ref Range Status   Specimen Description BLOOD RIGHT HAND  Final   Special Requests BOTTLES DRAWN AEROBIC AND ANAEROBIC 6CC  Final   Culture NO GROWTH 4 DAYS  Final   Report Status PENDING  Incomplete  Blood Culture (routine x 2)     Status: None (Preliminary result)   Collection Time: 04/04/15  6:49 AM  Result Value Ref Range Status   Specimen Description BLOOD RIGHT ARM  Final   Special Requests BOTTLES DRAWN AEROBIC AND ANAEROBIC 6CC  Final   Culture NO GROWTH 4 DAYS  Final   Report Status PENDING  Incomplete  MRSA PCR Screening     Status: None   Collection Time: 04/04/15  9:45 AM  Result Value Ref Range Status   MRSA by PCR NEGATIVE NEGATIVE Final    Comment:        The GeneXpert MRSA Assay  (FDA approved for NASAL specimens only), is one component of a comprehensive MRSA colonization surveillance program. It is not intended to diagnose MRSA infection nor to guide or monitor treatment for MRSA infections.     Studies/Results: No results found.  Medications:  Prior to Admission:  Prescriptions prior to admission  Medication Sig Dispense Refill Last Dose  . albuterol (PROAIR HFA) 108 (90 BASE) MCG/ACT inhaler Inhale 2 puffs into the lungs every 6 (six) hours as needed for wheezing or shortness of breath.   04/03/2015 at Unknown time  . budesonide-formoterol (SYMBICORT) 160-4.5 MCG/ACT inhaler Inhale 2 puffs into the lungs 2 (two) times daily.  unknown  . glimepiride (AMARYL) 4 MG tablet Take 4 mg by mouth daily before breakfast.   04/03/2015 at Unknown time  . ipratropium-albuterol (DUONEB) 0.5-2.5 (3) MG/3ML SOLN Take 3 mLs by nebulization 4 (four) times daily.   04/03/2015 at Unknown time  . lisinopril (PRINIVIL,ZESTRIL) 20 MG tablet Take 20 mg by mouth daily.     04/03/2015 at Unknown time  . metFORMIN (GLUCOPHAGE) 1000 MG tablet Take 1,000 mg by mouth 2 (two) times daily.     04/03/2015 at Unknown time  . naproxen sodium (ANAPROX) 220 MG tablet Take 220 mg by mouth daily as needed.   unknown  . pioglitazone (ACTOS) 15 MG tablet Take 30 mg by mouth daily.    04/03/2015 at Unknown time  . pravastatin (PRAVACHOL) 40 MG tablet Take 1 tablet by mouth at bedtime.    04/03/2015 at Unknown time  . predniSONE (DELTASONE) 10 MG tablet Take 10 mg by mouth. tapered dosing pt is on last day 3 tabs, 2tabs for 3 days, 1 tab x 3 days, 1/2 tab for 3 days.   04/03/2015 at Unknown time  . traMADol (ULTRAM) 50 MG tablet Take 50 mg by mouth every 6 (six) hours as needed for moderate pain.    04/03/2015 at Unknown time   Scheduled: . albuterol  2.5 mg Nebulization TID  . antiseptic oral rinse  7 mL Mouth Rinse q12n4p  . chlorhexidine  15 mL Mouth Rinse BID  . glimepiride  4 mg Oral Q breakfast   . heparin  5,000 Units Subcutaneous 3 times per day  . insulin aspart  0-20 Units Subcutaneous TID WC  . insulin aspart  0-5 Units Subcutaneous QHS  . insulin detemir  20 Units Subcutaneous BID  . methylPREDNISolone (SOLU-MEDROL) injection  40 mg Intravenous 3 times per day  . mometasone-formoterol  2 puff Inhalation BID  . nicotine  21 mg Transdermal Daily  . pravastatin  40 mg Oral QHS  . tiotropium  18 mcg Inhalation Daily   Continuous:  ONG:[id number]:albuterol, LORazepam, ondansetron **OR** ondansetron (ZOFRAN) IV, polyethylene glycol, promethazine, traMADol  Assesment: He was admitted with acute on chronic respiratory failure. He has COPD exacerbation and that's getting better. He's been on steroids and antibiotics. He has multi-factorial CO2 retention related to sleep apnea COPD morbid obesity and obesity hypoventilation. His pH was well compensated with a PCO2 of 80. He is requiring 10 L of oxygen at this point. An oxygen saturation in the high 80s to low 90s would be okay for him and reduce the risk of increasing his PCO2 and causing CO2 narcosis. Principal Problem:   Acute respiratory failure with hypoxia (HCC) Active Problems:   Diabetes (HCC)   Obstructive sleep apnea   Morbid obesity due to excess calories (HCC)   COPD with acute exacerbation (HCC)   Hyperkalemia    Plan: Agree is probably okay to move him out of stepdown at this point. I would leave him on IV steroids today and potentially switch him tomorrow. It looks like he will require a high flow oxygen at home.  He is otherwise on appropriate treatment for his COPD with inhaled bronchodilators and Spiriva and combination of long-acting bronchodilator and steroids. He would need to continue with this.  Thanks for allowing me to see him with you    LOS: 5 days   Yareni Creps L 04/09/2015, 8:34 AM

## 2015-04-10 LAB — GLUCOSE, CAPILLARY: GLUCOSE-CAPILLARY: 432 mg/dL — AB (ref 65–99)

## 2015-04-10 MED ORDER — PREDNISONE 10 MG PO TABS: ORAL_TABLET | ORAL | Status: DC

## 2015-04-10 MED ORDER — PREDNISONE 20 MG PO TABS
40.0000 mg | ORAL_TABLET | Freq: Every day | ORAL | Status: DC
  Filled 2015-04-10: qty 2

## 2015-04-10 MED ORDER — INSULIN DETEMIR 100 UNIT/ML ~~LOC~~ SOLN: 40.0000 [IU] | Freq: Every day | SUBCUTANEOUS | Status: DC

## 2015-04-10 NOTE — Care Management Important Message (Signed)
Important Message  Patient Details  Name: Brett Curry MRN: [id number] Date of Birth: [birth year]   Medicare Important Message Given:  Yes    Malcolm MetroChildress, Skyelar Swigart Demske, RN 04/10/2015, 10:35 AM

## 2015-04-10 NOTE — Progress Notes (Addendum)
Central telemetry notified of discharge, telemetry removed.  IV access removed.  Oxygen decreased to 8 L per case manager, waiting oxygen to be delivered for home use.

## 2015-04-10 NOTE — Discharge Summary (Signed)
Physician Discharge Summary  Brett Curry [facility] ZOX:[id number] DOB: [birth year] DOA: 04/04/2015   Admit date: 04/04/2015 Discharge date: 04/10/2015  Discharge Diagnoses:  Principal Problem:   Acute respiratory failure with hypoxia [facility]) Active Problems:   Diabetes (HCC)   Obstructive sleep apnea   Morbid obesity due to excess calories (HCC)   COPD with acute exacerbation (HCC)   Hyperkalemia    Wt Readings from Last 3 Encounters:  04/09/15 412 lb 11.2 oz (187.2 kg)  09/17/14 419 lb 9.6 oz (190.329 kg)  07/27/14 430 lb (195.047 kg)     Hospital Course:  This patient is a 55 year old male who presented with increased difficulty breathing. He has a history of COPD, obstructive sleep apnea, obesity hypoventilation syndrome and diabetes. He was found to have acute respiratory failure with hypoxia. He required treatment with BiPAP. He was monitored closely in the step down unit. He was treated with albuterol and ipratropium nebulizer treatments and intravenous Solu-Medrol. He was seen in pulmonology consultation by Dr. Juanetta Gosling. His respiratory status improved. He required high flow oxygen at 10 L by nasal cannula. He had a respiratory acidosis with CO2 retention. PCO2 was 80. He is feeling better on the morning of March 24 and is demanding to go home. Arrangements will be made for high flow oxygen at home. An oxygen saturation monitor is being prescribed. His diabetes has been treated with Levemir and NovoLog. Glimepiride has been continued while hospitalized but metformin and pioglitazone have been held. He was initially hyperkalemic requiring holding lisinopril. His potassium normalized. Lisinopril is being stopped.  His condition at discharge is improved. He will be seen in follow-up in my office in one week. He was prescribed a new glucose meter. He will receive a prednisone taper and will continue home nebulizer treatments. He has been encouraged to discontinue smoking.   Discharge  Instructions  Discharge Instructions    For home use only DME oxygen    Complete by:  As directed   Mode or (Route):  Nasal cannula  Liters per Minute:  10  Frequency:  Continuous (stationary and portable oxygen unit needed)  Oxygen delivery system:  Gas            Medication List    STOP taking these medications        lisinopril 20 MG tablet  Commonly known as:  PRINIVIL,ZESTRIL     naproxen sodium 220 MG tablet  Commonly known as:  ANAPROX     traMADol 50 MG tablet  Commonly known as:  ULTRAM      TAKE these medications        budesonide-formoterol 160-4.5 MCG/ACT inhaler  Commonly known as:  SYMBICORT  Inhale 2 puffs into the lungs 2 (two) times daily.     glimepiride 4 MG tablet  Commonly known as:  AMARYL  Take 4 mg by mouth daily before breakfast.     insulin detemir 100 UNIT/ML injection  Commonly known as:  LEVEMIR  Inject 0.4 mLs (40 Units total) into the skin at bedtime.     ipratropium-albuterol 0.5-2.5 (3) MG/3ML Soln  Commonly known as:  DUONEB  Take 3 mLs by nebulization 4 (four) times daily.     metFORMIN 1000 MG tablet  Commonly known as:  GLUCOPHAGE  Take 1,000 mg by mouth 2 (two) times daily.     pioglitazone 15 MG tablet  Commonly known as:  ACTOS  Take 30 mg by mouth daily.     pravastatin 40 MG tablet  Commonly  known as:  PRAVACHOL  Take 1 tablet by mouth at bedtime.     predniSONE 10 MG tablet  Commonly known as:  DELTASONE  4 a day for 3 days, 3 a day for 3 days, 2 a day for 3 days, 1 a day for 3 days, one half a day for 3 days then stop     PROAIR HFA 108 (90 Base) MCG/ACT inhaler  Generic drug:  albuterol  Inhale 2 puffs into the lungs every 6 (six) hours as needed for wheezing or shortness of breath.         Brett Curry 04/10/2015

## 2015-04-10 NOTE — Progress Notes (Signed)
SATURATION QUALIFICATIONS: (This note is used to comply with regulatory documentation for home oxygen)  Patient Saturations on Room Air at Rest = 90%  Patient Saturations on Room Air while Ambulating = 88%  Patient Saturations on 10 Liters of oxygen while Ambulating = 95%  Please briefly explain why patient needs home oxygen:

## 2015-04-10 NOTE — Progress Notes (Signed)
He is being discharged today. Discussed with Dr. Ouida SillsFagan. He will continue use of CPAP. He will be on high flow oxygen via nasal cannula initially at 8 L with adjustments made. His goal oxygen saturation should be 88-92 because of his severe CO2 retention. He was advised again to stop smoking.

## 2015-04-10 NOTE — Progress Notes (Signed)
[facility]  Patient Status: New  [facility] is providing the following services: RN and O2 (patient received Etank in the hospital and was educated on safe use. Home set up will be delivered upon patient's arrival home)  If patient discharges after hours, please call [phone].   Alroy BailiffLinda Lothian 04/10/2015, 9:22 AM

## 2015-04-10 NOTE — Progress Notes (Signed)
Discharged via wheelchair accompanied by staff with portable oxygen tank in use at 10L [place].  Stable at discharge, oxygen equipment to be delivered on return home.

## 2015-04-10 NOTE — Progress Notes (Signed)
Per Dr. Ouida SillsFagan, 10 units Novolog given for blood sugar of 432 along with Amaryl.

## 2015-04-10 NOTE — Care Management Note (Signed)
Case Management Note  Patient Details  Name: Brett BondsRandy P Mullally MRN: [id number] Date of Birth: 1960-02-18  Subjective/Objective:                  Pt is from home, lives with family and is ind with ADL's. Pt has CPAP and neb machine PTA. Pt not active with HH services PTA. Pt will need [facility] exacerbation [facility]. [facility]. Pt is aware HH has 48 hours to initiate services.   Action/Plan: Alroy BailiffLinda Lothian [facility], made aware of [facility] and will obtain pt info from chart and have port O2 tank delivered to room prior to discharge. Pt requiring 10 LPM HFNC. [facility] can provide regulator for 8lpm on port tank. Per MD it will be okay for pt to transport home with 8 lpm and resume 10 lpm when home.   Expected Discharge Date:     04/10/2015             Expected Discharge Plan:  Home w Home Health Services  In-House Referral:  NA  Discharge planning Services  CM Consult  Post Acute Care Choice:  Home Health, Durable Medical Equipment Choice offered to:  Patient  DME Arranged:  Oxygen DME Agency:  [facility]  HH Arranged[facility]  [facility]  [facility]  Status of Service:  Completed, signed off  Medicare Important Message Given:  Yes Date Medicare IM Given:    Medicare IM give by:    Date Additional Medicare IM Given:    Additional Medicare Important Message give by:     If discussed at Long Length of Stay Meetings, dates discussed:  04/09/2015  Additional Comments:  Malcolm MetroChildress, Aryanne Gilleland Demske, RN 04/10/2015, 10:39 AM

## 2015-04-13 ENCOUNTER — Ambulatory Visit: Payer: Medicare Other | Admitting: Cardiology

## 2015-04-15 ENCOUNTER — Ambulatory Visit: Payer: Medicare Other | Admitting: Skilled Nursing Facility1

## 2015-05-04 ENCOUNTER — Encounter: Payer: Medicare Other | Attending: Surgery | Admitting: Dietician

## 2015-05-04 ENCOUNTER — Telehealth (HOSPITAL_COMMUNITY): Payer: Self-pay | Admitting: *Deleted

## 2015-05-04 ENCOUNTER — Ambulatory Visit (INDEPENDENT_AMBULATORY_CARE_PROVIDER_SITE_OTHER): Payer: Medicare Other | Admitting: Cardiovascular Disease

## 2015-05-04 ENCOUNTER — Encounter: Payer: Self-pay | Admitting: Cardiovascular Disease

## 2015-05-04 ENCOUNTER — Encounter: Payer: Self-pay | Admitting: Dietician

## 2015-05-04 VITALS — BP 94/50 | HR 104 | Ht 72.0 in | Wt >= 6400 oz

## 2015-05-04 DIAGNOSIS — J9601 Acute respiratory failure with hypoxia: Secondary | ICD-10-CM

## 2015-05-04 DIAGNOSIS — Z01810 Encounter for preprocedural cardiovascular examination: Secondary | ICD-10-CM | POA: Diagnosis not present

## 2015-05-04 NOTE — Progress Notes (Signed)
  Pre-Op Assessment Visit:  Pre-Operative Sleeve Gastrectomy Surgery  Medical Nutrition Therapy:  Appt start time: 1410   End time:  1445.  Patient was seen on 05/04/2015 for Pre-Operative Nutrition Assessment. Assessment and letter of approval [facility] coordinator on 05/04/2015.   Preferred Learning Style:   No preference indicated   Learning Readiness:   Ready  Handouts given during visit include:  Pre-Op Goals Bariatric Surgery Protein Shakes   During the appointment today the following Pre-Op Goals were reviewed with the patient: Maintain or lose weight as instructed by your surgeon Make healthy food choices Begin to limit portion sizes Limited concentrated sugars and fried foods Keep fat/sugar in the single digits per serving on   food labels Practice CHEWING your food  (aim for 30 chews per bite or until applesauce consistency) Practice not drinking 15 minutes before, during, and 30 minutes after each meal/snack Avoid all carbonated beverages  Avoid/limit caffeinated beverages  Avoid all sugar-sweetened beverages Consume 3 meals per day; eat every 3-5 hours Make a list of non-food related activities Aim for 64-100 ounces of FLUID daily  Aim for at least 60-80 grams of PROTEIN daily Look for a liquid protein source that contain ?15 g protein and ?5 g carbohydrate  (ex: shakes, drinks, shots)  Patient-Centered Goals: Would like to improve his health overall, get knees replaced so he can exercise 10 confidence/10 importance scale   Demonstrated degree of understanding via:  Teach Back  Teaching Method Utilized:  Visual Auditory Hands on  Barriers to learning/adherence to lifestyle change: none  Patient to call the [facility] to enroll in Pre-Op and Post-Op Nutrition Education when surgery date is scheduled.

## 2015-05-04 NOTE — Patient Instructions (Signed)
Medication Instructions:  Your physician recommends that you continue on your current medications as directed. Please refer to the Current Medication list given to you today.   Labwork: None Ordered   Testing/Procedures: Your physician has requested that you have a lexiscan myoview. For further information please visit [email or web address]. Please follow instruction sheet, as given.   Follow-Up: Your physician recommends that you schedule a follow-up appointment in: as needed with Dr. Nahser.    If you need a refill on your cardiac medications before your next appointment, please call your pharmacy.   Thank you for choosing [facility]! Brittley Regner, RN [phone]   

## 2015-05-04 NOTE — Progress Notes (Signed)
Cardiology Office Note   Date:  05/04/2015   ID:  [name], DOB Oct 20, 1960, MRN [id number]  PCP:  Carylon PerchesFAGAN,ROY, MD  Cardiologist:   Vesta MixerNahser, Ila Landowski J, MD   Chief Complaint  Patient presents with  . Shortness of Breath   Problem list 1. Morbid obesity 2. COPD 3. Diabetes mellitus 4. Obesity hypoventilation 5. Obstructive sleep apnea 6. Hyperlipidemia    History of Present Illness: Brett Curry is a 55 y.o. male who presents for  Pre- op eval for gastric sleeve surgery  Referred from [facility] for pre - op . Does not exercise much.   He does not work,  On disability .   Denies any chest pain .   Has COPD  - quite smoking 32 days ago . Has significant DOE - was recently admitted to the hospital for respiratory failure.    Glucose levels have been much better .  Can climb 1-2 flights of stairs.   Has knee pain and DOE but no CP   Past Medical History  Diagnosis Date  . Hyperlipidemia   . Type II or unspecified type diabetes mellitus without mention of complication, not stated as uncontrolled   . Asthma   . COPD (chronic obstructive pulmonary disease) (HCC)   . Morbid obesity due to excess calories (HCC) 07/27/2014    Past Surgical History  Procedure Laterality Date  . Shoulder surgery      right  . Knee surgery      right  . Tonsillectomy       Current Outpatient Prescriptions  Medication Sig Dispense Refill  . albuterol (PROAIR HFA) 108 (90 BASE) MCG/ACT inhaler Inhale 2 puffs into the lungs every 6 (six) hours as needed for wheezing or shortness of breath.    . budesonide-formoterol (SYMBICORT) 160-4.5 MCG/ACT inhaler Inhale 2 puffs into the lungs 2 (two) times daily.    Marland Kitchen. glimepiride (AMARYL) 4 MG tablet Take 4 mg by mouth daily before breakfast.    . insulin detemir (LEVEMIR) 100 UNIT/ML injection Inject 0.4 mLs (40 Units total) into the skin at bedtime. 10 mL 11  . ipratropium-albuterol (DUONEB) 0.5-2.5 (3) MG/3ML SOLN Take 3 mLs by nebulization 4 (four)  times daily.    Marland Kitchen. LANTUS 100 UNIT/ML injection Inject 40 Units as directed daily.  10  . metFORMIN (GLUCOPHAGE) 1000 MG tablet Take 1,000 mg by mouth 2 (two) times daily.      . pioglitazone (ACTOS) 15 MG tablet Take 30 mg by mouth daily.     . predniSONE (DELTASONE) 10 MG tablet 4 a day for 3 days, 3 a day for 3 days, 2 a day for 3 days, 1 a day for 3 days, one half a day for 3 days then stop 32 tablet 0  . traMADol (ULTRAM) 50 MG tablet Take 50 mg by mouth at bedtime.  4   No current facility-administered medications for this visit.    Allergies:   Review of patient's allergies indicates no known allergies.    Social History:  The patient  reports that he has been smoking Cigarettes.  He has a 68 pack-year smoking history. He has never used smokeless tobacco. He reports that he does not drink alcohol or use illicit drugs.   Family History:  The patient's family history includes Asthma in his sister; Heart attack in his mother.    ROS:  Please see the history of present illness.    Review of Systems: Constitutional:  denies fever, chills, diaphoresis,  appetite change and fatigue.  HEENT: denies photophobia, eye pain, redness, hearing loss, ear pain, congestion, sore throat, rhinorrhea, sneezing, neck pain, neck stiffness and tinnitus.  Respiratory: admits to SOB,    Cardiovascular: denies chest pain, palpitations and leg swelling.  Gastrointestinal: denies nausea, vomiting, abdominal pain, diarrhea, constipation, blood in stool.  Genitourinary: denies dysuria, urgency, frequency, hematuria, flank pain and difficulty urinating.  Musculoskeletal: admits to  Knee pain  and gait problem.   Skin: denies pallor, rash and wound.  Neurological: denies dizziness, seizures, syncope, weakness, light-headedness, numbness and headaches.   Hematological: denies adenopathy, easy bruising, personal or family bleeding history.  Psychiatric/ Behavioral: denies suicidal ideation, mood changes,  confusion, nervousness, sleep disturbance and agitation.       All other systems are reviewed and negative.    PHYSICAL EXAM: VS:  BP 94/50 mmHg  Pulse 104  Ht 6' (1.829 m)  Wt 435 lb (197.315 kg)  BMI 58.98 kg/m2 , BMI Body mass index is 58.98 kg/(m^2). GEN: Well nourished, well developed, in no acute distress HEENT: normal Neck: no JVD, carotid bruits, or masses Cardiac: RRR; no murmurs, rubs, or gallops, chronic stasis changes Respiratory:  clear to auscultation bilaterally, normal work of breathing GI: soft, nontender, nondistended, + BS MS: no deformity or atrophy Skin: warm and dry, no rash Neuro:  Strength and sensation are intact Psych: normal   EKG:  EKG is not ordered today.    Recent Labs: 07/27/2014: TSH 1.123 04/04/2015: B Natriuretic Peptide 25.0 04/09/2015: ALT 41; BUN 24*; Creatinine, Ser 0.60*; Hemoglobin 16.1; Platelets 175; Potassium 4.1; Sodium 138    Lipid Panel No results found for: CHOL, TRIG, HDL, CHOLHDL, VLDL, LDLCALC, LDLDIRECT    Wt Readings from Last 3 Encounters:  05/04/15 435 lb (197.315 kg)  04/09/15 412 lb 11.2 oz (187.2 kg)  09/17/14 419 lb 9.6 oz (190.329 kg)      Other studies Reviewed: Additional studies/ records that were reviewed today include: . Review of the above records demonstrates:    ASSESSMENT AND PLAN:  1.  Pre op evaluation prior to bariatric surgery - gastric sleeve.  Brett Curry does not exercise He has significant shortness breath doing everyday activities. At this point we don't have any way to assess him about doing further studies.we will do a 2 day Lexiscan Myoview study. I expect that to we'll probably have significant attenuation but hopefully we will be we'll see that his left ventricular function is normal and that he doesn't have any large  ischemic areas. Unless the study is high risk I think that we can safely send him to bariatric surgery.  I'll see him on an as-needed basis.   Current medicines are  reviewed at length with the patient today.  The patient does not have concerns regarding medicines.  The following changes have been made:  no change  Labs/ tests ordered today include:  No orders of the defined types were placed in this encounter.     Disposition:   FU with me as needed.      Brett Curry, Deloris Ping, MD  05/04/2015 11:23 AM    [facility] [address] [place], [place], [place]  [address] Phone: [phone]; Fax: [phone]   [facility]  [address] [place], [place]  [address] [phone]   Fax [phone]

## 2015-05-04 NOTE — Patient Instructions (Addendum)
Follow Pre-Op Goals Try Protein Shakes Check with insurance company to see about supervised weight loss requirement Call [facility] at [phone] when surgery is scheduled to enroll in Pre-Op Class  Things to remember:  Please always be honest with us. We want to support you!  If you have any questions or concerns in between appointments, please call or email Jennette BankerLiz, Leslie, or Jacki ConesLaurie.  The diet after surgery will be high protein and low in carbohydrate.  Vitamins and calcium need to be taken for the rest of your life.  Feel free to include support people in any classes or appointments.   Supplement recommendations:  Complete" Multivitamin: Sleeve Gastrectomy and RYGB patients take a double dose of MVI. LAGB patients take single dose as it is written on the package. Vitamin must be liquid or chewable but not gummy. Examples of these include Flintstones Complete and Centrum Complete. If the vitamin is bariatric-specific, take 1 dose as it is already formulated for bariatric surgery patients. Examples of these are Bariatric Advantage, Celebrate, and Tyson FoodsWellesse. These can be found at the [facility] and/or online.     Calcium citrate: 1500 mg/day of Calcium citrate (also chewable or liquid) is recommended for all procedures. The body is only able to absorb 500-600 mg of Calcium at one time so 3 daily doses of 500 mg are recommended. Calcium doses must be taken a minimum of 2 hours apart. Additionally, Calcium must be taken 2 hours apart from iron-containing MVI. Examples of brands include Celebrate, Bariatric Advantage, and Wellesse. These brands must be purchased online or at the [facility]. Citracal Petites is the only Calcium citrate supplement found in general grocery stores and pharmacies. This is in tablet form and may be recommended for patients who do not tolerate chewable Calcium.  Continued or added Vitamin D supplementation based on individual needs.     Vitamin B12: 300-500 mcg/day for Sleeve Gastrectomy and RYGB. Optional for LAGB patients as stomach remains fully intact. Must be taken intramuscularly, sublingually, or inhaled nasally. Oral route is not recommended.

## 2015-05-04 NOTE — Telephone Encounter (Signed)
Patient given detailed instructions per Myocardial Perfusion Study Information Sheet for the test on 05/06/15 at 1230. Patient notified to arrive 15 minutes early and that it is imperative to arrive on time for appointment to keep from having the test rescheduled.  If you need to cancel or reschedule your appointment, please call the office within 24 hours of your appointment. Failure to do so may result in a cancellation of your appointment, and a $50 no show fee. Patient verbalized understanding.Andre Gallego, Adelene IdlerCynthia W

## 2015-05-06 ENCOUNTER — Encounter: Payer: Self-pay | Admitting: Cardiovascular Disease

## 2015-05-06 ENCOUNTER — Ambulatory Visit (HOSPITAL_COMMUNITY): Payer: Medicare Other

## 2015-05-06 ENCOUNTER — Ambulatory Visit (INDEPENDENT_AMBULATORY_CARE_PROVIDER_SITE_OTHER): Payer: Medicare Other | Admitting: Cardiovascular Disease

## 2015-05-06 VITALS — BP 88/44 | HR 110 | Ht 71.5 in

## 2015-05-06 DIAGNOSIS — I480 Paroxysmal atrial fibrillation: Secondary | ICD-10-CM | POA: Diagnosis not present

## 2015-05-06 LAB — TSH: TSH: 1.2 mIU/L (ref 0.40–4.50)

## 2015-05-06 MED ORDER — RIVAROXABAN 20 MG PO TABS: 20.0000 mg | ORAL_TABLET | Freq: Every day | ORAL | Status: DC

## 2015-05-06 NOTE — Progress Notes (Signed)
Cardiology Office Note   Date:  05/06/2015   ID:  Brett Curry, DOB 11-27-1960, MRN [id number]  PCP:  Brett Curry,ROY, MD  Cardiologist:   Brett Curry, Brett Guallpa J, MD   Chief Complaint  Patient presents with  . Atrial Fibrillation   Problem list 1. Morbid obesity 2. COPD 3. Diabetes mellitus 4. Obesity hypoventilation 5. Obstructive sleep apnea 6. Hyperlipidemia    History of Present Illness: Brett Curry is a 55 y.o. male who presents for  Pre- op eval for gastric sleeve surgery  Referred from [facility] for pre - op . Does not exercise much.   He does not work,  On disability .   Denies any chest pain .   Has COPD  - quite smoking 32 days ago . Has significant DOE - was recently admitted to the hospital for respiratory failure.    Glucose levels have been much better .  Can climb 1-2 flights of stairs.   Has knee pain and DOE but no CP  May 06, 2015 Pt Was scheduled for a 2 day Lexiscan Myoview study for part of his preoperative evaluation. When he presented for his Myoview study, he was in rapid atrial fibrillation. His heart rate increased from 130s to 180s with ambulation. He was very short of breath with ambulation and had to stop 3 times walking from the nuclear lab to the exam room.  We eventually had an right in a wheelchair.  On March  18, he had an ECG done in a normal sinus rhythm. His heart rate was 110 at that time. When I saw him in the office last week his blood pressure was slightly elevated but was regular. Today he clearly has atrial fibrillation with rapid ventricular response. His blood pressure is low.    Past Medical History  Diagnosis Date  . Hyperlipidemia   . Type II or unspecified type diabetes mellitus without mention of complication, not stated as uncontrolled   . Asthma   . COPD (chronic obstructive pulmonary disease) (HCC)   . Morbid obesity due to excess calories (HCC) 07/27/2014    Past Surgical History  Procedure Laterality Date  . Shoulder  surgery      right  . Knee surgery      right  . Tonsillectomy       Current Outpatient Prescriptions  Medication Sig Dispense Refill  . albuterol (PROAIR HFA) 108 (90 BASE) MCG/ACT inhaler Inhale 2 puffs into the lungs every 6 (six) hours as needed for wheezing or shortness of breath.    . budesonide-formoterol (SYMBICORT) 160-4.5 MCG/ACT inhaler Inhale 2 puffs into the lungs 2 (two) times daily.    Marland Kitchen. glimepiride (AMARYL) 4 MG tablet Take 4 mg by mouth daily before breakfast.    . insulin detemir (LEVEMIR) 100 UNIT/ML injection Inject 0.4 mLs (40 Units total) into the skin at bedtime. 10 mL 11  . ipratropium-albuterol (DUONEB) 0.5-2.5 (3) MG/3ML SOLN Take 3 mLs by nebulization 4 (four) times daily.    Marland Kitchen. LANTUS 100 UNIT/ML injection Inject 40 Units as directed daily.  10  . metFORMIN (GLUCOPHAGE) 1000 MG tablet Take 1,000 mg by mouth 2 (two) times daily.      . pioglitazone (ACTOS) 15 MG tablet Take 30 mg by mouth daily.     . predniSONE (DELTASONE) 10 MG tablet 4 a day for 3 days, 3 a day for 3 days, 2 a day for 3 days, 1 a day for 3 days, one half a day for  3 days then stop 32 tablet 0  . traMADol (ULTRAM) 50 MG tablet Take 50 mg by mouth at bedtime.  4  . rivaroxaban (XARELTO) 20 MG TABS tablet Take 1 tablet (20 mg total) by mouth daily with supper. 30 tablet 11   No current facility-administered medications for this visit.    Allergies:   Review of patient's allergies indicates no known allergies.    Social History:  The patient  reports that he has been smoking Cigarettes.  He has a 68 pack-year smoking history. He has never used smokeless tobacco. He reports that he does not drink alcohol or use illicit drugs.   Family History:  The patient's family history includes Asthma in his sister; Heart attack in his mother.    ROS:  Please see the history of present illness.    Review of Systems: Constitutional:  denies fever, chills, diaphoresis, appetite change and fatigue.    HEENT: denies photophobia, eye pain, redness, hearing loss, ear pain, congestion, sore throat, rhinorrhea, sneezing, neck pain, neck stiffness and tinnitus.  Respiratory: admits to SOB,    Cardiovascular: denies chest pain, palpitations and leg swelling.  Gastrointestinal: denies nausea, vomiting, abdominal pain, diarrhea, constipation, blood in stool.  Genitourinary: denies dysuria, urgency, frequency, hematuria, flank pain and difficulty urinating.  Musculoskeletal: admits to  Knee pain  and gait problem.   Skin: denies pallor, rash and wound.  Neurological: denies dizziness, seizures, syncope, weakness, light-headedness, numbness and headaches.   Hematological: denies adenopathy, easy bruising, personal or family bleeding history.  Psychiatric/ Behavioral: denies suicidal ideation, mood changes, confusion, nervousness, sleep disturbance and agitation.       All other systems are reviewed and negative.    PHYSICAL EXAM: VS:  BP 88/44 mmHg  Pulse 110  Ht 5' 11.5" (1.816 m)  Wt  , BMI There is no weight on file to calculate BMI. GEN: Well nourished, well developed, in no acute distress HEENT: normal Neck: no JVD, carotid bruits, or masses Cardiac: RRR; no murmurs, rubs, or gallops, chronic stasis changes Respiratory:  clear to auscultation bilaterally, normal work of breathing GI: soft, nontender, nondistended, + BS MS: no deformity or atrophy Skin: warm and dry, no rash Neuro:  Strength and sensation are intact Psych: normal   EKG:  EKG is ordered today. A-fib with V rate of 117.       Recent Labs: 07/27/2014: TSH 1.123 04/04/2015: B Natriuretic Peptide 25.0 04/09/2015: ALT 41; BUN 24*; Creatinine, Ser 0.60*; Hemoglobin 16.1; Platelets 175; Potassium 4.1; Sodium 138    Lipid Panel No results found for: CHOL, TRIG, HDL, CHOLHDL, VLDL, LDLCALC, LDLDIRECT    Wt Readings from Last 3 Encounters:  05/06/15 435 lb (197.315 kg)  05/04/15 435 lb (197.315 kg)  05/04/15 435 lb  (197.315 kg)      Other studies Reviewed: Additional studies/ records that were reviewed today include: . Review of the above records demonstrates:    ASSESSMENT AND PLAN:  1.  Pre op evaluation prior to bariatric surgery - gastric sleeve.   We had scheduled him for a myoview . He was in rapid atrial fib when he showed up for the myoview. BP was low - 88/44   2. Atrial fib:   Is clearly has PAF . His BP is too low to start a CCB or beta blocker  His HR is rapid but i've noticed that his HR is elevated - even when he is in NSR .  Will need an echo  And TSH  Will treat with Xarelto 20 mg a day for 3-4 week and will then bring him back for initiation of amiodarone  He has PAF so cardioversion would not be of any benefit at this point He is completely asymptomatic .   We discussed risk factor modification  - he needs to lose weight , CPAP is working well according to him  He has stopped smoking    Current medicines are reviewed at length with the patient today.  The patient does not have concerns regarding medicines.  The following changes have been made:  no change  Labs/ tests ordered today include:   Orders Placed This Encounter  Procedures  . TSH  . Echocardiogram     Disposition:   FU with me in 4-6 weeks .      Erial Fikes, Deloris Ping, MD  05/06/2015 1:59 PM    [facility] [address] [place], [place], [place]  [address] Phone: [phone]; Fax: [phone]   [facility]  [address] [place], [place]  [address] [phone]   Fax [phone]

## 2015-05-06 NOTE — Patient Instructions (Addendum)
Medication Instructions:  START Xarelto 20 mg once daily - take with your heaviest meal of the day   Labwork: TODAY - TSH    Testing/Procedures: Your physician has requested that you have an echocardiogram. Echocardiography is a painless test that uses sound waves to create images of your heart. It provides your doctor with information about the size and shape of your heart and how well your heart's chambers and valves are working. This procedure takes approximately one hour. There are no restrictions for this procedure.   Follow-Up: Your physician recommends that you return for follow-up appointment on Friday May 26 at 11:15   If you need a refill on your cardiac medications before your next appointment, please call your pharmacy.   Thank you for choosing [facility]! Eligha BridegroomMichelle Kathlean Cinco, RN [phone]

## 2015-05-07 ENCOUNTER — Ambulatory Visit (HOSPITAL_COMMUNITY): Payer: Self-pay

## 2015-05-12 ENCOUNTER — Encounter: Payer: Self-pay | Admitting: Pulmonary Disease

## 2015-05-12 ENCOUNTER — Ambulatory Visit (INDEPENDENT_AMBULATORY_CARE_PROVIDER_SITE_OTHER): Payer: Medicare Other | Admitting: Pulmonary Disease

## 2015-05-12 ENCOUNTER — Ambulatory Visit (INDEPENDENT_AMBULATORY_CARE_PROVIDER_SITE_OTHER): Payer: No Typology Code available for payment source | Admitting: Psychiatry

## 2015-05-12 ENCOUNTER — Ambulatory Visit: Payer: Self-pay | Admitting: Dietician

## 2015-05-12 VITALS — BP 112/62 | HR 83 | Ht 71.5 in | Wt >= 6400 oz

## 2015-05-12 DIAGNOSIS — G4733 Obstructive sleep apnea (adult) (pediatric): Secondary | ICD-10-CM

## 2015-05-12 DIAGNOSIS — J449 Chronic obstructive pulmonary disease, unspecified: Secondary | ICD-10-CM | POA: Diagnosis not present

## 2015-05-12 DIAGNOSIS — E662 Morbid (severe) obesity with alveolar hypoventilation: Secondary | ICD-10-CM | POA: Diagnosis not present

## 2015-05-12 DIAGNOSIS — Z01818 Encounter for other preprocedural examination: Secondary | ICD-10-CM | POA: Insufficient documentation

## 2015-05-12 DIAGNOSIS — J439 Emphysema, unspecified: Secondary | ICD-10-CM

## 2015-05-12 DIAGNOSIS — F509 Eating disorder, unspecified: Secondary | ICD-10-CM

## 2015-05-12 NOTE — Assessment & Plan Note (Signed)
[facility] (03/2015)  7.36/80/60 on 10L > while admitted. On 5L o2 with cpap. Needs ONO on cpap.

## 2015-05-12 NOTE — Assessment & Plan Note (Signed)
Likely mod-severe. Quit smoking 45 days ago. Feels better on symbicort.  Plan : 1. Needs PFT prior to surgery.  2. Cont symbicort 160/4.5 2P BID.

## 2015-05-12 NOTE — Patient Instructions (Signed)
1. Cont cpap machine.  2. We will need to gather data prior to being cleared _ will need cpap download, PFT.  3. Will need overnight O2 test.   Return to clinic in 1 month.

## 2015-05-12 NOTE — Assessment & Plan Note (Signed)
PSG 08/05/09 >> AHI 90 Auto CPAP 06/19/14 to 09/16/14 >> used on 88 of 90 nights with average 5 hrs and 3 min. Average AHI is 8.5 with median CPAP 11 cm H2O and 95 th percentile CPAP 16 cm H20.  Cont cpap therapy. No issues with it. Feels better. Needs supplies. Started on o2 5L with cpap in 03/2015 with recent aecopd.  Plan : 1. Cont cpap. He is on autocpap.  2. Needs DL prior to gastric bypass surgery. 3. Started o2 5L with cpap in 03/2015. Needs to plug O2 into CPAP. Needs ONO -- plan to taper down o2 as he retains CO2.  4. Needs supplies. May need to switch DMEs.

## 2015-05-12 NOTE — Progress Notes (Signed)
Subjective:    Patient ID: Brett Curry, male    DOB: 1960-12-28, 55 y.o.   MRN: [id number]  HPI Patient returns to office as follow-up on his sleep apnea. He has COPD as well which is taken cared of by PCP.  ROV (05/12/15) Pt is here for f/u on his osa.  Recently admitted in 03/2015 for rapid afib and aecopd. Pt wants to have gastric bypass and has seen Dr. Andrey CampanileWilson. Uses cpap machine. No issues with it. Feels better using it. No issues with it.  Has not received supplies in 6-8 mos. Started on O2 5L with cpap last 03/2015.    Review of Systems  Constitutional: Negative.   HENT: Negative.   Eyes: Negative.   Respiratory: Positive for shortness of breath. Negative for cough.   Cardiovascular: Negative.   Gastrointestinal: Negative.   Endocrine: Negative.   Genitourinary: Negative.   Musculoskeletal: Negative.   Skin: Negative.   Allergic/Immunologic: Negative.   Neurological: Negative.   Hematological: Negative.   Psychiatric/Behavioral: Negative.   All other systems reviewed and are negative.  Past Medical History  Diagnosis Date  . Hyperlipidemia   . Type II or unspecified type diabetes mellitus without mention of complication, not stated as uncontrolled   . Asthma   . COPD (chronic obstructive pulmonary disease) (HCC)   . Morbid obesity due to excess calories (HCC) 07/27/2014   (-) CVA,CA, DVT  Family History  Problem Relation Age of Onset  . Asthma Sister   . Heart attack Mother      Past Surgical History  Procedure Laterality Date  . Shoulder surgery      right  . Knee surgery      right  . Tonsillectomy      Social History   Social History  . Marital Status: Married    Spouse Name: N/A  . Number of Children: N/A  . Years of Education: N/A   Occupational History  . truck driver    Social History Main Topics  . Smoking status: Former Smoker -- 2.00 packs/day for 34 years    Types: Cigarettes    Quit date: 03/28/2015  . Smokeless tobacco: Never Used       Comment: started at age 55.   Marland Kitchen. Alcohol Use: No  . Drug Use: No  . Sexual Activity: Yes   Other Topics Concern  . Not on file   Social History Narrative     No Known Allergies   Outpatient Prescriptions Prior to Visit  Medication Sig Dispense Refill  . albuterol (PROAIR HFA) 108 (90 BASE) MCG/ACT inhaler Inhale 2 puffs into the lungs every 6 (six) hours as needed for wheezing or shortness of breath.    . budesonide-formoterol (SYMBICORT) 160-4.5 MCG/ACT inhaler Inhale 2 puffs into the lungs 2 (two) times daily.    Marland Kitchen. glimepiride (AMARYL) 4 MG tablet Take 4 mg by mouth daily before breakfast.    . insulin detemir (LEVEMIR) 100 UNIT/ML injection Inject 0.4 mLs (40 Units total) into the skin at bedtime. 10 mL 11  . ipratropium-albuterol (DUONEB) 0.5-2.5 (3) MG/3ML SOLN Take 3 mLs by nebulization 4 (four) times daily.    Marland Kitchen. LANTUS 100 UNIT/ML injection Inject 40 Units as directed daily.  10  . metFORMIN (GLUCOPHAGE) 1000 MG tablet Take 1,000 mg by mouth 2 (two) times daily.      . pioglitazone (ACTOS) 15 MG tablet Take 30 mg by mouth daily.     . predniSONE (DELTASONE) 10 MG tablet  4 a day for 3 days, 3 a day for 3 days, 2 a day for 3 days, 1 a day for 3 days, one half a day for 3 days then stop 32 tablet 0  . rivaroxaban (XARELTO) 20 MG TABS tablet Take 1 tablet (20 mg total) by mouth daily with supper. 30 tablet 11  . traMADol (ULTRAM) 50 MG tablet Take 50 mg by mouth at bedtime.  4   No facility-administered medications prior to visit.   No orders of the defined types were placed in this encounter.          Objective:   Physical Exam   Vitals:  Filed Vitals:   05/12/15 1022  BP: 112/62  Pulse: 83  Height: 5' 11.5" (1.816 m)  Weight: 436 lb (197.768 kg)  SpO2: 93%    Constitutional/General:  Pleasant, well-nourished, well-developed, not in any distress,  Comfortably seating.  Well kempt morbidly obeses  Body mass index is 59.97 kg/(m^2). Wt Readings from Last 3  Encounters:  05/12/15 436 lb (197.768 kg)  05/06/15 435 lb (197.315 kg)  05/04/15 435 lb (197.315 kg)      HEENT: Pupils equal and reactive to light and accommodation. Anicteric sclerae. Normal nasal mucosa.   No oral  lesions,  mouth clear,  oropharynx clear, no postnasal drip. (-) Oral thrush. No dental caries.  Airway - Mallampati class IV. Short stout neck.   Neck: No masses. Midline trachea. No JVD, (-) LAD. (-) bruits appreciated.  Respiratory/Chest: Grossly normal chest. (-) deformity. (-) Accessory muscle use.  Symmetric expansion. (-) Tenderness on palpation.  Resonant on percussion.  Diminished BS on both lower lung zones. (-) wheezing, crackles, rhonchi (-) egophony  Cardiovascular: Regular rate and  rhythm, heart sounds normal, no murmur or gallops,   Gastrointestinal:  Normal bowel sounds. Soft, non-tender. No hepatosplenomegaly.  (-) masses.   Musculoskeletal:  Normal muscle tone. Normal gait.   Extremities: Grossly normal. (-) clubbing, cyanosis.  (+) edema gr 2 - chronic  Skin: (-) rash,lesions seen.   Neurological/Psychiatric : alert, oriented to time, place, person. Normal mood and affect           Assessment & Plan:  Obstructive sleep apnea PSG 08/05/09 >> AHI 90 Auto CPAP 06/19/14 to 09/16/14 >> used on 88 of 90 nights with average 5 hrs and 3 min. Average AHI is 8.5 with median CPAP 11 cm H2O and 95 th percentile CPAP 16 cm H20.  Cont cpap therapy. No issues with it. Feels better. Needs supplies. Started on o2 5L with cpap in 03/2015 with recent aecopd.  Plan : 1. Cont cpap. He is on autocpap.  2. Needs DL prior to gastric bypass surgery. 3. Started o2 5L with cpap in 03/2015. Needs to plug O2 into CPAP. Needs ONO -- plan to taper down o2 as he retains CO2.  4. Needs supplies. May need to switch DMEs.   COPD (chronic obstructive pulmonary disease) (HCC) Likely mod-severe. Quit smoking 45 days ago. Feels better on symbicort.  Plan : 1.  Needs PFT prior to surgery.  2. Cont symbicort 160/4.5 2P BID.   Obesity hypoventilation syndrome (HCC) ABG (03/2015)  7.36/80/60 on 10L > while admitted. On 5L o2 with cpap. Needs ONO on cpap.   Morbid obesity (HCC) Weight reduction  Pre-operative clearance Pt is being seen by Dr. Andrey Campanile for gastric bypass surgery.  He has severe OSA, OHS, likely mod-severe COPD, Hypoxemia, Morbid Obesity.  Pt is at increased risks for perioperative complications  given the above diagnosis. We need to determine his compliance to CPAP therapy, overnight oximetry, PFT prior to final clearance. Plan to get a chest x-ray during the next encounter. Last chest x-ray done in March 2017 did not show any acute infiltrate, low lung volumes.  I mentioned to the patient increased risks with surgery. He may end up being on the ventilator postoperatively, may even require tracheostomy if with difficult weaning secondary to his above issues. He seems to understand it but his mind is preoccupied with having surgery done as soon as possible. We need to reiterate possible complications can during the next visit.  Patient also has paroxysmal A. fib which also increases his risks with complications postop.   Return to clinic in 1 month. Sooner if surgery clearance is contingent on pulmonary clearance.    Pollie Meyer, MD 05/12/2015, 11:12 AM [facility] Pager [phone] After 3 pm or if no answer, call [phone]

## 2015-05-12 NOTE — Assessment & Plan Note (Addendum)
Pt is being seen by Dr. Andrey CampanileWilson for gastric bypass surgery.  He has severe OSA, OHS, likely mod-severe COPD, Hypoxemia, Morbid Obesity.  Pt is at increased risks for perioperative complications given the above diagnosis. We need to determine his compliance to CPAP therapy, overnight oximetry, PFT prior to final clearance. Plan to get a chest x-ray during the next encounter. Last chest x-ray done in March 2017 did not show any acute infiltrate, low lung volumes.  I mentioned to the patient increased risks with surgery. He may end up being on the ventilator postoperatively, may even require tracheostomy if with difficult weaning secondary to his above issues. He seems to understand it but his mind is preoccupied with having surgery done as soon as possible. We need to reiterate possible complications can during the next visit.  Patient also has paroxysmal A. fib which also increases his risks with complications postop.

## 2015-05-12 NOTE — Assessment & Plan Note (Signed)
Weight reduction 

## 2015-05-20 ENCOUNTER — Other Ambulatory Visit: Payer: Self-pay

## 2015-05-20 ENCOUNTER — Ambulatory Visit (HOSPITAL_COMMUNITY): Payer: Medicare Other | Attending: Internal Medicine

## 2015-05-20 DIAGNOSIS — I4891 Unspecified atrial fibrillation: Secondary | ICD-10-CM | POA: Diagnosis present

## 2015-05-20 DIAGNOSIS — E119 Type 2 diabetes mellitus without complications: Secondary | ICD-10-CM | POA: Diagnosis not present

## 2015-05-20 DIAGNOSIS — Z6841 Body Mass Index (BMI) 40.0 and over, adult: Secondary | ICD-10-CM | POA: Insufficient documentation

## 2015-05-20 DIAGNOSIS — I480 Paroxysmal atrial fibrillation: Secondary | ICD-10-CM

## 2015-05-20 DIAGNOSIS — E785 Hyperlipidemia, unspecified: Secondary | ICD-10-CM | POA: Diagnosis not present

## 2015-05-20 DIAGNOSIS — I517 Cardiomegaly: Secondary | ICD-10-CM | POA: Insufficient documentation

## 2015-05-20 DIAGNOSIS — G4733 Obstructive sleep apnea (adult) (pediatric): Secondary | ICD-10-CM | POA: Diagnosis not present

## 2015-05-20 MED ORDER — PERFLUTREN LIPID MICROSPHERE
1.0000 mL | INTRAVENOUS | Status: AC | PRN
  Administered 2015-05-20: 3 mL via INTRAVENOUS

## 2015-06-08 ENCOUNTER — Telehealth: Payer: Self-pay | Admitting: Pulmonary Disease

## 2015-06-08 NOTE — Telephone Encounter (Signed)
PFT scheduled for 06/10/15; ONO has been done and results will be faxed to office; DL will be obtained day before OV.   Thanks!

## 2015-06-08 NOTE — Telephone Encounter (Signed)
Brett Curry --  Pt is being seen by Dr Andrey CampanileWilson for bariatric surgery. Needs clearance from us.  Pt will need the following before f/u by TP on 5/30 : 1. DL on cpap the last month 2. ONO on cpap and O2.  3. PFT  pls facilitate if it has not been done yet. Thanks.  AD

## 2015-06-10 ENCOUNTER — Ambulatory Visit (INDEPENDENT_AMBULATORY_CARE_PROVIDER_SITE_OTHER): Payer: Medicare Other | Admitting: Cardiovascular Disease

## 2015-06-10 ENCOUNTER — Encounter: Payer: Self-pay | Admitting: Cardiovascular Disease

## 2015-06-10 ENCOUNTER — Ambulatory Visit (HOSPITAL_COMMUNITY)
Admission: RE | Admit: 2015-06-10 | Discharge: 2015-06-10 | Disposition: A | Discharge status: Home / Self Care | Payer: Medicare Other | Source: Ambulatory Visit | Attending: Pulmonary Disease | Admitting: Pulmonary Disease

## 2015-06-10 VITALS — BP 130/70 | HR 86 | Ht 72.0 in | Wt >= 6400 oz

## 2015-06-10 DIAGNOSIS — R0602 Shortness of breath: Secondary | ICD-10-CM

## 2015-06-10 DIAGNOSIS — J449 Chronic obstructive pulmonary disease, unspecified: Secondary | ICD-10-CM

## 2015-06-10 DIAGNOSIS — I480 Paroxysmal atrial fibrillation: Secondary | ICD-10-CM

## 2015-06-10 DIAGNOSIS — Z01818 Encounter for other preprocedural examination: Secondary | ICD-10-CM | POA: Diagnosis not present

## 2015-06-10 DIAGNOSIS — F17210 Nicotine dependence, cigarettes, uncomplicated: Secondary | ICD-10-CM | POA: Insufficient documentation

## 2015-06-10 DIAGNOSIS — J988 Other specified respiratory disorders: Secondary | ICD-10-CM | POA: Diagnosis not present

## 2015-06-10 LAB — PULMONARY FUNCTION TEST
DL/VA % pred: 78 %
DL/VA: 3.71 ml/min/mmHg/L
DLCO unc % pred: 55 %
DLCO unc: 19.49 ml/min/mmHg
FEF 25-75 Post: 0.15 L/sec
FEF 25-75 Pre: 0.38 L/sec
FEF2575-%Change-Post: -61 %
FEF2575-%Pred-Post: 4 %
FEF2575-%Pred-Pre: 11 %
FEV1-%Change-Post: 3 %
FEV1-%PRED-POST: 29 %
FEV1-%Pred-Pre: 28 %
FEV1-PRE: 1.13 L
FEV1-Post: 1.18 L
FEV1FVC-%Change-Post: 12 %
FEV1FVC-%Pred-Pre: 50 %
FEV6-%CHANGE-POST: -22 %
FEV6-%PRED-POST: 37 %
FEV6-%PRED-PRE: 48 %
FEV6-POST: 1.88 L
FEV6-Pre: 2.43 L
FEV6FVC-%CHANGE-POST: -15 %
FEV6FVC-%PRED-POST: 73 %
FEV6FVC-%Pred-Pre: 86 %
FVC-%Change-Post: -7 %
FVC-%Pred-Post: 51 %
FVC-%Pred-Pre: 56 %
FVC-POST: 2.71 L
FVC-Pre: 2.94 L
POST FEV6/FVC RATIO: 70 %
PRE FEV1/FVC RATIO: 39 %
Post FEV1/FVC ratio: 43 %
Pre FEV6/FVC Ratio: 83 %
RV % pred: 142 %
RV: 3.21 L
TLC % PRED: 82 %
TLC: 6.09 L

## 2015-06-10 MED ORDER — ALBUTEROL SULFATE (2.5 MG/3ML) 0.083% IN NEBU
2.5000 mg | INHALATION_SOLUTION | Freq: Once | RESPIRATORY_TRACT | Status: AC
Start: 2015-06-10 — End: 2015-06-10
  Administered 2015-06-10: 2.5 mg via RESPIRATORY_TRACT

## 2015-06-10 MED ORDER — DILTIAZEM HCL 30 MG PO TABS: 30.0000 mg | ORAL_TABLET | Freq: Two times a day (BID) | ORAL | Status: DC

## 2015-06-10 NOTE — Progress Notes (Signed)
Patient ID: Brett Curry, male   DOB: 23-Jul-1960, 55 y.o.   MRN: [id number]      SUBJECTIVE: The patient presents to establish care in our [place] office. He saw Dr. Elease Hashimoto on 05/06/15. He has a history of paroxysmal atrial fibrillation, morbid obesity, COPD, prior history of tobacco abuse, and chronic exertional dyspnea, as well as sleep apnea, hyperlipidemia, and obesity hypoventilation syndrome. He was previously evaluated for gastric sleeve surgery.  Echocardiogram 05/20/15 showed normal left ventricular systolic function and regional wall motion, LVEF 55-60%, with moderate left atrial dilatation. It was a poor quality study.  When he presented for his nuclear myocardial perfusion study he was found to be in rapid atrial fibrillation. Blood pressure was 88/44 so beta blockers and calcium channel blockers could not be initiated. The plan was to bring him back after 3 or 4 weeks of anticoagulation for the initiation of amiodarone. He was encouraged to stop smoking and to lose weight.  TSH was normal at 1.2.  I have reviewed rhythm strips performed at the time of his stress test which did in fact show rapid atrial fibrillation, heart rate 164 bpm. He denies palpitations and chest pain. He is due to undergo pulmonary function testing.  He is frustrated because he could not have his stress test done and he did not understand why. He did not believe he was in an abnormal heart rhythm.  Review of Systems: As per "subjective", otherwise negative.  No Known Allergies  Current Outpatient Prescriptions  Medication Sig Dispense Refill  . albuterol (PROAIR HFA) 108 (90 BASE) MCG/ACT inhaler Inhale 2 puffs into the lungs every 6 (six) hours as needed for wheezing or shortness of breath.    Marland Kitchen glimepiride (AMARYL) 4 MG tablet Take 4 mg by mouth daily before breakfast.    . insulin detemir (LEVEMIR) 100 UNIT/ML injection Inject 0.4 mLs (40 Units total) into the skin at bedtime. 10 mL 11  .  ipratropium-albuterol (DUONEB) 0.5-2.5 (3) MG/3ML SOLN Take 3 mLs by nebulization 4 (four) times daily.    Marland Kitchen LANTUS 100 UNIT/ML injection Inject 40 Units as directed daily.  10  . metFORMIN (GLUCOPHAGE) 1000 MG tablet Take 1,000 mg by mouth 2 (two) times daily.      . pioglitazone (ACTOS) 15 MG tablet Take 30 mg by mouth daily.     . rivaroxaban (XARELTO) 20 MG TABS tablet Take 1 tablet (20 mg total) by mouth daily with supper. 30 tablet 11  . traMADol (ULTRAM) 50 MG tablet Take 50 mg by mouth at bedtime.  4  . predniSONE (DELTASONE) 10 MG tablet 4 a day for 3 days, 3 a day for 3 days, 2 a day for 3 days, 1 a day for 3 days, one half a day for 3 days then stop (Patient not taking: Reported on 06/10/2015) 32 tablet 0   No current facility-administered medications for this visit.    Past Medical History  Diagnosis Date  . Hyperlipidemia   . Type II or unspecified type diabetes mellitus without mention of complication, not stated as uncontrolled   . Asthma   . COPD (chronic obstructive pulmonary disease) (HCC)   . Morbid obesity due to excess calories (HCC) 07/27/2014    Past Surgical History  Procedure Laterality Date  . Shoulder surgery      right  . Knee surgery      right  . Tonsillectomy      Social History   Social History  . Marital Status:  Married    Spouse Name: N/A  . Number of Children: N/A  . Years of Education: N/A   Occupational History  . truck driver    Social History Main Topics  . Smoking status: Former Smoker -- 2.00 packs/day for 34 years    Types: Cigarettes    Quit date: 03/28/2015  . Smokeless tobacco: Never Used     Comment: started at age 55.   Marland Kitchen. Alcohol Use: No  . Drug Use: No  . Sexual Activity: Yes   Other Topics Concern  . Not on file   Social History Narrative     Filed Vitals:   06/10/15 1046  BP: 130/70  Pulse: 86  Height: 6' (1.829 m)  Weight: 425 lb (192.779 kg)  SpO2: 92%    PHYSICAL EXAM General: NAD HEENT:  Normal. Neck: JVP difficult to assess. Lungs: Poor air movement. Insp/exp wheezes, no rales. CV: Distant heart tones. 1+ pretibial and periankle edema with venous stasis dermatitis.   Abdomen: Morbidly obese.  Neurologic: Alert and oriented.  Psych: Normal affect. Skin: venous stasis dermatitis of both legs. Musculoskeletal: No gross deformities.    ECG: Most recent ECG reviewed.      ASSESSMENT AND PLAN: 1. Paroxsymal atrial fibrillation: As BP has normalized, will start diltiazem 30 mg bid. Given lung disease (COPD), will hold off on amiodarone for now. CHA2DS2Vasc score 1 (diabetes), thus anticoagulation not indicated. Will dc Xarelto. Will obtain copy of ECG from PCP's office for review.  2. Preop clearance/shortness of breath: Will reschedule 2-day Lexiscan Myoview stress test.   Dispo: fu 2 months.  Time spent: 40 minutes, of which greater than 50% was spent reviewing symptoms, relevant blood tests and studies, and discussing management plan with the patient.  Prentice DockerSuresh Koneswaran, M.D., F.A.C.C.

## 2015-06-10 NOTE — Telephone Encounter (Signed)
Pt returning call.Stanley A Dalton ° °

## 2015-06-10 NOTE — Telephone Encounter (Signed)
[name], What needs to be done with this?  Patient is calling back, did not see that we had called him based on this note?

## 2015-06-10 NOTE — Patient Instructions (Signed)
Medication Instructions:  STOP XARELTO  START DILTIAZEM 30 MG TWO TIMES DAILY  Labwork: NONE  Testing/Procedures: Your physician has requested that you have a lexiscan myoview. For further information please visit [email or web address]. Please follow instruction sheet, as given.  * 2 DAY STUDY *   Follow-Up: Your physician recommends that you schedule a follow-up appointment in: 2 MONTHS WITH EKG   Any Other Special Instructions Will Be Listed Below (If Applicable).  I WILL REQUEST A COPY OF YOUR EKG FROM DR. FAGAN'S OFFICE   If you need a refill on your cardiac medications before your next appointment, please call your pharmacy.

## 2015-06-11 ENCOUNTER — Telehealth: Payer: Self-pay | Admitting: Pulmonary Disease

## 2015-06-11 NOTE — Telephone Encounter (Signed)
Notes Recorded by Louann SjogrenJose Angelo A de Dios, MD on 06/10/2015 at 3:51 PM pls tell pt he has severe - very severe copd. He should stop smoking. Cont symbicort. If he is still SOB, we can add spiriva respimat. Tnx. AD  Spoke with pt and notified of results per AD Pt verbalized understanding and denied any questions. He did not wish to start spiriva at this time

## 2015-06-11 NOTE — Telephone Encounter (Signed)
Pt returning call.Brett Curry ° °

## 2015-06-11 NOTE — Progress Notes (Signed)
Quick Note:  Spoke with pt and notified of results per AD Pt verbalized understanding and denied any questions. ______

## 2015-06-11 NOTE — Telephone Encounter (Signed)
Pt seen 4.25.17 by AD: Patient Instructions       1. Cont cpap machine.   2. We will need to gather data prior to being cleared _ will need cpap download, PFT.   3. Will need overnight O2 test.   Return to clinic in 1 month.     Called spoke with patient, advised to keep appt scheduled w/ TP as per AD in the 5.22.17 phone note.  Pt voiced his understanding and will keep the appt.

## 2015-06-11 NOTE — Telephone Encounter (Signed)
Please see PFT results note. Have ATC pt several times, no answer and no vm set up. Please get good number to reach pt if he leaves another message. Thanks!

## 2015-06-12 ENCOUNTER — Encounter (HOSPITAL_COMMUNITY): Payer: Self-pay

## 2015-06-12 ENCOUNTER — Ambulatory Visit: Payer: Medicare Other | Admitting: Cardiovascular Disease

## 2015-06-16 ENCOUNTER — Encounter: Payer: Self-pay | Admitting: Adult Health

## 2015-06-16 ENCOUNTER — Telehealth: Payer: Self-pay | Admitting: Adult Health

## 2015-06-16 ENCOUNTER — Ambulatory Visit (INDEPENDENT_AMBULATORY_CARE_PROVIDER_SITE_OTHER): Payer: Medicare Other | Admitting: Adult Health

## 2015-06-16 VITALS — BP 100/66 | HR 107 | Temp 97.9°F | Ht 72.0 in | Wt >= 6400 oz

## 2015-06-16 DIAGNOSIS — J9611 Chronic respiratory failure with hypoxia: Secondary | ICD-10-CM | POA: Diagnosis not present

## 2015-06-16 DIAGNOSIS — J449 Chronic obstructive pulmonary disease, unspecified: Secondary | ICD-10-CM | POA: Diagnosis not present

## 2015-06-16 DIAGNOSIS — G4733 Obstructive sleep apnea (adult) (pediatric): Secondary | ICD-10-CM

## 2015-06-16 DIAGNOSIS — J9690 Respiratory failure, unspecified, unspecified whether with hypoxia or hypercapnia: Secondary | ICD-10-CM | POA: Insufficient documentation

## 2015-06-16 NOTE — Assessment & Plan Note (Signed)
GOLD IV COPD -will try to change to LAMA/LABA  Check on cost factor may need pt asssitance done for pt.  Congratulated on smoking cessation .   Plan  Stop Advair and Symbicort  Begin ANORO 1 puff daily  Call back if too expensive can send papers to company to check on assitance.   He is a very high risk for surgery with post op complications discussed in detail with pt as following :  Prolonged ventilator support, possible tracheostomy .  He is at higher risk for DVT/PE with weight and immobility will be an issue.   Follow up Dr. Craige CottaSood  In 6 months and As needed

## 2015-06-16 NOTE — Patient Instructions (Signed)
Stop Advair and Symbicort  Begin ANORO 1 puff daily  Call back if too expensive can send papers to company to check on assitance.  Continue on CPAP with oxygen At bedtime   Work on weight loss.  Will call with ONO results.  Will send note to surgeon as your are very high risk for surgery as discussed today.  Follow up Dr. Craige CottaSood  In 6 months and As needed

## 2015-06-16 NOTE — Progress Notes (Signed)
Reviewed and agree with assessment/plan.  Coralyn HellingVineet Dayten Juba, MD [facility] 06/16/2015, 11:21 PM Pager:  [phone]

## 2015-06-16 NOTE — Assessment & Plan Note (Signed)
OSA /OHS with excellent compliance on CPAP /O2 however nocturnal hypoxia persists  Will adjust supplemental oxygen to 5l/m with CPAP At bedtime  And recheck ONO   Plan  Continue on CPAP with oxygen At bedtime   Work on weight loss.  Will send note to surgeon as your are very high risk for surgery as discussed today.  Follow up Dr. Craige CottaSood  In 6 months and As needed

## 2015-06-16 NOTE — Telephone Encounter (Signed)
Per verbal order from TP after reviewing ONO results  Increase 5L oxygen with CPAP at bedtime Repeat ONO on 5L with CPAP  Called spoke with pt. Reviewed results and recs. Pt voiced understanding and had no further questions. Order has been placed. Nothing further needed.

## 2015-06-16 NOTE — Progress Notes (Signed)
Subjective:    Patient ID: Brett Curry, male    DOB: 25-May-1960, 55 y.o.   MRN: [id number]  HPI 55 year old male, former smoker (quit 3/217 with 30+ yrs of smoking ) , with severe sleep apnea and COPD  06/16/2015 Follow up : OSA  Patient returns for a one-month follow-up. Patient was recently seen in the office for surgical clearance for gastric bypass Patient is on C Pap with oxygen at 3 L.Download April 30 through 06/15/2015 showed excellent compliance with 100% usage. He used his C Pap 7 hours each night. He is on AutoSet of 5-20 cm of H2O. AHI 2.1. Minimal leaks.. Overnight oximetry test on C Pap with oxygen showed significant desats with 4hr <88% .   Pulmonary function test showed an FEV1 at 29%, ratio 43, no significant bronchodilator response, FVC 51%, DLCO 55%. Pt depends on samples from PCP. , has used Symbicort in past . Currently on Advair .  Says he can not afford inhalers.   Patient is followed by cardiology for atrial fibrillation. Last echo showed normal left ventricular systolic function, EF 55-60% with moderate left atrial dilatation. He has an upcoming stress test pending. Patient remains on cardizem. Xarelto was recently stopped.   Pt is diabetic. Has morbid obesity with current weight at 429 . BMI 58.   He is very frustrated today at visit as he says all he wants is to get surgery for weight loss and he keeps hitting road blocks. He says he does not care what happens to him in surgery , it is his decision to proceed.  He needs knee replacement surgery because he can barely get around and they will not do this surgery until he loses weight.   We discussed the above test results and advised he is very high risk of post op complications with severe OSA and Very severe GOLD IV COPD . He is high risk for requiring long term ventilator support and possible tracheostomy due to lung disease, weight and OSA. He says he is aware .Marland Kitchen  Pt education discussed and reviewed in detail  with pt .     Past Medical History  Diagnosis Date  . Hyperlipidemia   . Type II or unspecified type diabetes mellitus without mention of complication, not stated as uncontrolled   . Asthma   . COPD (chronic obstructive pulmonary disease) (HCC)   . Morbid obesity due to excess calories (HCC) 07/27/2014   Current Outpatient Prescriptions on File Prior to Visit  Medication Sig Dispense Refill  . albuterol (PROAIR HFA) 108 (90 BASE) MCG/ACT inhaler Inhale 2 puffs into the lungs every 6 (six) hours as needed for wheezing or shortness of breath.    . diltiazem (CARDIZEM) 30 MG tablet Take 1 tablet (30 mg total) by mouth 2 (two) times daily. 180 tablet 3  . glimepiride (AMARYL) 4 MG tablet Take 4 mg by mouth daily before breakfast.    . insulin detemir (LEVEMIR) 100 UNIT/ML injection Inject 0.4 mLs (40 Units total) into the skin at bedtime. 10 mL 11  . ipratropium-albuterol (DUONEB) 0.5-2.5 (3) MG/3ML SOLN Take 3 mLs by nebulization 4 (four) times daily.    . metFORMIN (GLUCOPHAGE) 1000 MG tablet Take 1,000 mg by mouth 2 (two) times daily.      . pioglitazone (ACTOS) 15 MG tablet Take 30 mg by mouth daily.     . traMADol (ULTRAM) 50 MG tablet Take 50 mg by mouth at bedtime.  4   No current facility-administered  medications on file prior to visit.      Review of Systems  Constitutional:   No  weight loss, night sweats,  Fevers, chills, fatigue, or  lassitude.  HEENT:   No headaches,  Difficulty swallowing,  Tooth/dental problems, or  Sore throat,                No sneezing, itching, ear ache, nasal congestion, post nasal drip,   CV:  No chest pain,  Orthopnea, PND, swelling in lower extremities, anasarca, dizziness, palpitations, syncope.   GI  No heartburn, indigestion, abdominal pain, nausea, vomiting, diarrhea, change in bowel habits, loss of appetite, bloody stools.   Resp:  No coughing up of blood.  No change in color of mucus.  No wheezing.  No chest wall deformity  Skin: no  rash or lesions.  GU: no dysuria, change in color of urine, no urgency or frequency.  No flank pain, no hematuria   MS: Chronic knee pain.   Psych:  No change in mood or affect. No depression or anxiety.  No memory loss.          Objective:   Physical Exam  Filed Vitals:   06/16/15 0919  BP: 100/66  Pulse: 107  Temp: 97.9 F (36.6 C)  TempSrc: Oral  Height: 6' (1.829 m)  Weight: 429 lb (194.593 kg)  SpO2: 94%  ;.Body mass index is 58.17 kg/(m^2).  GEN: A/Ox3; pleasant , NAD, morbidly obese   HEENT:  [place]/AT,  EACs-clear, TMs-wnl, NOSE-clear, THROAT-clear, no lesions, no postnasal drip or exudate noted.   NECK:  Supple w/ fair ROM; no JVD; normal carotid impulses w/o bruits; no thyromegaly or nodules palpated; no lymphadenopathy.  RESP  Clear  P & A; w/o, wheezes/ rales/ or rhonchi.no accessory muscle use, no dullness to percussion  CARD:  RRR, no m/r/g  , tr  peripheral edema, pulses intact, no cyanosis or clubbing. Venous insufficiency changes with stasis dermatitic changes noted.   GI:   Soft & nt; nml bowel sounds; no organomegaly or masses detected.  Musco: Warm bil, no deformities or joint swelling noted.   Neuro: alert, no focal deficits noted.    Skin: Warm, no lesions or rashes  Tammy Parrett NP-C  [facility]  06/16/2015      Assessment & Plan:

## 2015-06-18 ENCOUNTER — Encounter (HOSPITAL_COMMUNITY)
Admission: RE | Admit: 2015-06-18 | Discharge: 2015-06-18 | Disposition: A | Discharge status: Home / Self Care | Payer: Medicare Other | Source: Ambulatory Visit | Attending: Cardiovascular Disease | Admitting: Cardiovascular Disease

## 2015-06-18 ENCOUNTER — Inpatient Hospital Stay (HOSPITAL_COMMUNITY): Admission: RE | Admit: 2015-06-18 | Payer: Self-pay | Source: Ambulatory Visit

## 2015-06-18 ENCOUNTER — Telehealth: Payer: Self-pay | Admitting: Adult Health

## 2015-06-18 DIAGNOSIS — I4891 Unspecified atrial fibrillation: Secondary | ICD-10-CM | POA: Insufficient documentation

## 2015-06-18 DIAGNOSIS — R0602 Shortness of breath: Secondary | ICD-10-CM | POA: Insufficient documentation

## 2015-06-18 DIAGNOSIS — R931 Abnormal findings on diagnostic imaging of heart and coronary circulation: Secondary | ICD-10-CM | POA: Insufficient documentation

## 2015-06-18 DIAGNOSIS — Z01818 Encounter for other preprocedural examination: Secondary | ICD-10-CM | POA: Diagnosis not present

## 2015-06-18 MED ORDER — UMECLIDINIUM-VILANTEROL 62.5-25 MCG/INH IN AEPB: 1.0000 | INHALATION_SPRAY | Freq: Every day | RESPIRATORY_TRACT | Status: DC

## 2015-06-18 MED ORDER — TECHNETIUM TC 99M TETROFOSMIN IV KIT
25.0000 | PACK | Freq: Once | INTRAVENOUS | Status: AC | PRN
  Administered 2015-06-18: 23.5 via INTRAVENOUS

## 2015-06-18 NOTE — Telephone Encounter (Signed)
Spoke with pt. States that he was given a sample of Anoro. Was told that a prescription was going to be called in but wasn't. Rx has been sent in and advised pt that if it's to expensive to let us know. Nothing further was needed.

## 2015-06-19 ENCOUNTER — Encounter (HOSPITAL_COMMUNITY)
Admission: RE | Admit: 2015-06-19 | Discharge: 2015-06-19 | Disposition: A | Discharge status: Home / Self Care | Payer: Medicare Other | Source: Ambulatory Visit | Attending: Cardiovascular Disease | Admitting: Cardiovascular Disease

## 2015-06-19 ENCOUNTER — Encounter (HOSPITAL_COMMUNITY): Payer: Self-pay

## 2015-06-19 ENCOUNTER — Telehealth: Payer: Self-pay

## 2015-06-19 DIAGNOSIS — Z01818 Encounter for other preprocedural examination: Secondary | ICD-10-CM | POA: Diagnosis not present

## 2015-06-19 DIAGNOSIS — I4891 Unspecified atrial fibrillation: Secondary | ICD-10-CM | POA: Diagnosis not present

## 2015-06-19 DIAGNOSIS — R0602 Shortness of breath: Secondary | ICD-10-CM | POA: Diagnosis present

## 2015-06-19 DIAGNOSIS — R931 Abnormal findings on diagnostic imaging of heart and coronary circulation: Secondary | ICD-10-CM | POA: Diagnosis not present

## 2015-06-19 LAB — NM MYOCAR MULTI W/SPECT W/WALL MOTION / EF
CHL CUP NUCLEAR SDS: 6
CHL CUP NUCLEAR SRS: 5
CHL CUP RESTING HR STRESS: 96 {beats}/min
LHR: 0.43
LV dias vol: 179 mL (ref 62–150)
LV sys vol: 110 mL
Peak HR: 116 {beats}/min
SSS: 11
TID: 0.95

## 2015-06-19 MED ORDER — TECHNETIUM TC 99M TETROFOSMIN IV KIT
30.0000 | PACK | Freq: Once | INTRAVENOUS | Status: AC | PRN
  Administered 2015-06-19: 30 via INTRAVENOUS

## 2015-06-19 MED ORDER — REGADENOSON 0.4 MG/5ML IV SOLN
INTRAVENOUS | Status: AC
  Administered 2015-06-19: 0.4 mg via INTRAVENOUS
  Filled 2015-06-19: qty 5

## 2015-06-19 MED ORDER — SODIUM CHLORIDE 0.9% FLUSH
INTRAVENOUS | Status: AC
  Administered 2015-06-19: 10 mL via INTRAVENOUS
  Filled 2015-06-19: qty 10

## 2015-06-19 NOTE — Telephone Encounter (Signed)
Apt 6/19 at 4:20 pm to see Dr Purvis SheffieldKoneswaran to discuss nuclear stress test results

## 2015-06-24 ENCOUNTER — Ambulatory Visit: Payer: Medicare Other | Admitting: Pulmonary Disease

## 2015-06-26 ENCOUNTER — Encounter: Payer: Self-pay | Admitting: Adult Health

## 2015-06-30 ENCOUNTER — Telehealth: Payer: Self-pay | Admitting: Pulmonary Disease

## 2015-06-30 NOTE — Telephone Encounter (Signed)
Spoke with patient-States Anoro is too expensive at $40 a month and needs to know what to do from here. I advised patient he will most likely need to get his formulary list to help with knowing what will work best cost wise for patient.   TP please advise. Thanks.

## 2015-07-01 ENCOUNTER — Encounter: Payer: Self-pay | Admitting: Nutrition

## 2015-07-01 ENCOUNTER — Encounter: Payer: Medicare Other | Attending: Surgery | Admitting: Nutrition

## 2015-07-01 DIAGNOSIS — IMO0002 Reserved for concepts with insufficient information to code with codable children: Secondary | ICD-10-CM

## 2015-07-01 DIAGNOSIS — Z794 Long term (current) use of insulin: Secondary | ICD-10-CM

## 2015-07-01 DIAGNOSIS — E118 Type 2 diabetes mellitus with unspecified complications: Secondary | ICD-10-CM

## 2015-07-01 DIAGNOSIS — E1165 Type 2 diabetes mellitus with hyperglycemia: Secondary | ICD-10-CM

## 2015-07-01 NOTE — Progress Notes (Signed)
  Pre-Op Assessment Visit:  Pre-Operative Sleeve Gastrectomy Surgery  Medical Nutrition Therapy:  Appt start time: 9:30   End time:  1000 Patient was seen on 05/04/2015 for Pre-Operative Nutrition Assessment. Assessment [facility] on 05/04/2015.  A1C 9.2%.  He notes he has cut his portions in half. He has lost 8 lbs since last visit. He is chewing his foods more thoroughly and more aware of what he is eating. Avoiding snacks between meals. He is working on chewing foods more close to 30 times per bite. Drinking SF koolaid. Diet remains higher in fat and sodium and low in fiber. Not checking blood sugars. Willing to start testing blood sugars. ON 40 units of Levemir,  And Glimepiride, Actos and Metformin 2 g/d.  Very short of breath with walking. Wears oxygen at home 3 L but not when he is not at home. Uses CPap at home.  Preferred Learning Style:   No preference indicated   Learning Readiness:   Ready  Handouts given during visit include:  Pre-Op Goals Bariatric Surgery Protein Shakes   During the appointment today the following Pre-Op Goals were reviewed with the patient: Maintain or lose weight as instructed by your surgeon Make healthy food choices Begin to limit portion sizes Limited concentrated sugars and fried foods Keep fat/sugar in the single digits per serving on   food labels Practice CHEWING your food  (aim for 30 chews per bite or until applesauce consistency) Practice not drinking 15 minutes before, during, and 30 minutes after each meal/snack Avoid all carbonated beverages  Avoid/limit caffeinated beverages  Avoid all sugar-sweetened beverages Consume 3 meals per day; eat every 3-5 hours Make a list of non-food related activities Aim for 64-100 ounces of FLUID daily  Aim for at least 60-80 grams of PROTEIN daily Look for a liquid protein source that contain ?15 g protein and ?5 g  carbohydrate  (ex: shakes, drinks, shots)  Patient-Centered Goals: Would like to improve his health overall, get knees replaced so he can exercise 10 confidence/10 importance scale    Goals 1. Follow Plate Method 2. Cut out out processed foods 3. Cut down on portion sizes 4. Increase protein and vegetables. 5. Lose 1-2 lbs per week. 6. Chew  Each bite 30 times.   Demonstrated degree of understanding via:  Teach Back  Teaching Method Utilized:  Visual Auditory Hands on  Barriers to learning/adherence to lifestyle change: none  [facility] to [facility] to enroll in Pre op/ Post-Op Nutrition Education when surgery date is scheduled.

## 2015-07-01 NOTE — Patient Instructions (Addendum)
Goals 1. Follow Plate Method 2. Cut out out processed foods 3. Cut down on portion sizes 4. Increase protein and vegetables. 5. Lose 1-2 lbs per week. 6. Chew  Each bite 30 times. 7. Test blood sugars in am and at bedtime daily.

## 2015-07-03 NOTE — Telephone Encounter (Signed)
Need formulary although if he has [employer] insurance do we have copay cards. ???

## 2015-07-03 NOTE — Telephone Encounter (Signed)
Spoke with pt. He is aware that he will need to contact his insurance company for a copy of his formulary. He verbalized understanding.

## 2015-07-03 NOTE — Telephone Encounter (Signed)
TP - please advise. Thanks. 

## 2015-07-06 ENCOUNTER — Other Ambulatory Visit: Payer: Self-pay | Admitting: Cardiovascular Disease

## 2015-07-06 ENCOUNTER — Encounter: Payer: Self-pay | Admitting: Cardiovascular Disease

## 2015-07-06 ENCOUNTER — Ambulatory Visit (INDEPENDENT_AMBULATORY_CARE_PROVIDER_SITE_OTHER): Payer: Medicare Other | Admitting: Cardiovascular Disease

## 2015-07-06 VITALS — BP 148/72 | HR 52 | Ht 72.0 in | Wt >= 6400 oz

## 2015-07-06 DIAGNOSIS — Z01818 Encounter for other preprocedural examination: Secondary | ICD-10-CM

## 2015-07-06 DIAGNOSIS — R9439 Abnormal result of other cardiovascular function study: Secondary | ICD-10-CM

## 2015-07-06 DIAGNOSIS — R0609 Other forms of dyspnea: Principal | ICD-10-CM

## 2015-07-06 DIAGNOSIS — R0602 Shortness of breath: Secondary | ICD-10-CM

## 2015-07-06 DIAGNOSIS — R931 Abnormal findings on diagnostic imaging of heart and coronary circulation: Secondary | ICD-10-CM | POA: Diagnosis not present

## 2015-07-06 DIAGNOSIS — I480 Paroxysmal atrial fibrillation: Secondary | ICD-10-CM | POA: Diagnosis not present

## 2015-07-06 NOTE — Patient Instructions (Signed)
Your physician has requested that you have a cardiac catheterization. Cardiac catheterization is used to diagnose and/or treat various heart conditions. Doctors may recommend this procedure for a number of different reasons. The most common reason is to evaluate chest pain. Chest pain can be a symptom of coronary artery disease (CAD), and cardiac catheterization can show whether plaque is narrowing or blocking your heart's arteries. This procedure is also used to evaluate the valves, as well as measure the blood flow and oxygen levels in different parts of your heart. For further information please visit [email or web address]. Please follow instruction sheet, as given.    See written instructions given to you regarding cath      Thank you for choosing [facility] !

## 2015-07-06 NOTE — Progress Notes (Signed)
Patient ID: Brett BondsRandy P Theall, male   DOB: 01-21-1960, 55 y.o.   MRN: [id number]      SUBJECTIVE: The patient returns for follow-up after undergoing cardiovascular testing performed for the evaluation of shortness of breath.  Nuclear MPI study 06/19/15 was high risk with moderate areas of ischemia in the anterolateral wall and apex, LVEF 30-44%.  He has a history of paroxysmal atrial fibrillation, morbid obesity, COPD, prior history of tobacco abuse, and chronic exertional dyspnea, as well as sleep apnea, hyperlipidemia, and obesity hypoventilation syndrome. He was previously evaluated for gastric sleeve surgery.  Echocardiogram 05/20/15 showed normal left ventricular systolic function and regional wall motion, LVEF 55-60%, with moderate left atrial dilatation. It was a poor quality study.   Review of Systems: As per "subjective", otherwise negative.  No Known Allergies  Current Outpatient Prescriptions  Medication Sig Dispense Refill  . albuterol (PROAIR HFA) 108 (90 BASE) MCG/ACT inhaler Inhale 2 puffs into the lungs every 6 (six) hours as needed for wheezing or shortness of breath.    . diltiazem (CARDIZEM) 30 MG tablet Take 1 tablet (30 mg total) by mouth 2 (two) times daily. 180 tablet 3  . glimepiride (AMARYL) 4 MG tablet Take 4 mg by mouth daily before breakfast.    . insulin detemir (LEVEMIR) 100 UNIT/ML injection Inject 0.4 mLs (40 Units total) into the skin at bedtime. 10 mL 11  . ipratropium-albuterol (DUONEB) 0.5-2.5 (3) MG/3ML SOLN Take 3 mLs by nebulization 4 (four) times daily.    . metFORMIN (GLUCOPHAGE) 1000 MG tablet Take 1,000 mg by mouth 2 (two) times daily.      . Omega-3 Fatty Acids (FISH OIL) 1200 MG CAPS Take 1,200 mg by mouth 2 (two) times daily.    . pioglitazone (ACTOS) 15 MG tablet Take 30 mg by mouth daily.     . traMADol (ULTRAM) 50 MG tablet Take 50 mg by mouth at bedtime.  4  . umeclidinium-vilanterol (ANORO ELLIPTA) 62.5-25 MCG/INH AEPB Inhale 1 puff into the  lungs daily. 60 each 5   No current facility-administered medications for this visit.    Past Medical History  Diagnosis Date  . Hyperlipidemia   . Type II or unspecified type diabetes mellitus without mention of complication, not stated as uncontrolled   . Asthma   . COPD (chronic obstructive pulmonary disease) (HCC)   . Morbid obesity due to excess calories (HCC) 07/27/2014    Past Surgical History  Procedure Laterality Date  . Shoulder surgery      right  . Knee surgery      right  . Tonsillectomy      Social History   Social History  . Marital Status: Married    Spouse Name: N/A  . Number of Children: N/A  . Years of Education: N/A   Occupational History  . truck driver    Social History Main Topics  . Smoking status: Former Smoker -- 2.00 packs/day for 34 years    Types: Cigarettes    Quit date: 03/28/2015  . Smokeless tobacco: Never Used     Comment: started at age 55.   Marland Kitchen. Alcohol Use: No  . Drug Use: No  . Sexual Activity: Yes   Other Topics Concern  . Not on file   Social History Narrative     Filed Vitals:   07/06/15 1604  BP: 148/72  Pulse: 52  Height: 6' (1.829 m)  Weight: 426 lb (193.232 kg)  SpO2: 95%    PHYSICAL EXAM General:  NAD HEENT: Normal. Neck: JVP difficult to assess. Lungs: Poor air movement. Insp/exp wheezes, no rales. CV: Distant heart tones. 1+ pretibial and periankle edema with venous stasis dermatitis.  Abdomen: Morbidly obese.  Neurologic: Alert and oriented.  Psych: Normal affect. Skin: venous stasis dermatitis of both legs. Musculoskeletal: No gross deformities.   ECG: Most recent ECG reviewed.      ASSESSMENT AND PLAN: 1. Paroxsymal atrial fibrillation: Continue diltiazem 30 mg bid. Given lung disease (COPD), will hold off on amiodarone for now. CHA2DS2Vasc score 1 (diabetes), thus anticoagulation not indicated.   2. Abnormal stress test/preop clearance/shortness of breath: High risk Lexiscan Myoview  stress test as detailed above. Will arrange for coronary angiography.  3. Essential HTN: Elevated. Will monitor.   Dispo: fu after cath.   Wilbon Obenchain, M.D., F.A.C.C.  

## 2015-07-07 LAB — BASIC METABOLIC PANEL
BUN: 16 mg/dL (ref 7–25)
CALCIUM: 9.3 mg/dL (ref 8.6–10.3)
CO2: 29 mmol/L (ref 20–31)
CREATININE: 0.7 mg/dL (ref 0.70–1.33)
Chloride: 98 mmol/L (ref 98–110)
GLUCOSE: 209 mg/dL — AB (ref 65–99)
Potassium: 4.6 mmol/L (ref 3.5–5.3)
Sodium: 138 mmol/L (ref 135–146)

## 2015-07-07 LAB — CBC WITH DIFFERENTIAL/PLATELET
Basophils Absolute: 0 cells/uL (ref 0–200)
Basophils Relative: 0 %
Eosinophils Absolute: 291 cells/uL (ref 15–500)
Eosinophils Relative: 3 %
HEMATOCRIT: 39.8 % (ref 38.5–50.0)
Hemoglobin: 12.9 g/dL — ABNORMAL LOW (ref 13.2–17.1)
LYMPHS PCT: 27 %
Lymphs Abs: 2619 cells/uL (ref 850–3900)
MCH: 28.9 pg (ref 27.0–33.0)
MCHC: 32.4 g/dL (ref 32.0–36.0)
MCV: 89 fL (ref 80.0–100.0)
MONO ABS: 776 {cells}/uL (ref 200–950)
MONOS PCT: 8 %
MPV: 9.7 fL (ref 7.5–12.5)
NEUTROS PCT: 62 %
Neutro Abs: 6014 cells/uL (ref 1500–7800)
Platelets: 249 10*3/uL (ref 140–400)
RBC: 4.47 MIL/uL (ref 4.20–5.80)
RDW: 15.6 % — AB (ref 11.0–15.0)
WBC: 9.7 10*3/uL (ref 3.8–10.8)

## 2015-07-07 LAB — PROTIME-INR
INR: 1
PROTHROMBIN TIME: 10.9 s (ref 9.0–11.5)

## 2015-07-08 DIAGNOSIS — R9439 Abnormal result of other cardiovascular function study: Secondary | ICD-10-CM | POA: Diagnosis present

## 2015-07-08 DIAGNOSIS — I480 Paroxysmal atrial fibrillation: Secondary | ICD-10-CM | POA: Diagnosis present

## 2015-07-09 ENCOUNTER — Encounter (HOSPITAL_COMMUNITY): Payer: Self-pay | Admitting: Cardiology

## 2015-07-09 ENCOUNTER — Encounter (HOSPITAL_COMMUNITY)
Admission: RE | Disposition: A | Discharge status: Home / Self Care | Payer: Self-pay | Source: Ambulatory Visit | Attending: Cardiology

## 2015-07-09 ENCOUNTER — Other Ambulatory Visit: Payer: Self-pay

## 2015-07-09 ENCOUNTER — Ambulatory Visit (HOSPITAL_COMMUNITY)
Admission: RE | Admit: 2015-07-09 | Discharge: 2015-07-09 | Disposition: A | Discharge status: Home / Self Care | Payer: Medicare Other | Source: Ambulatory Visit | Attending: Cardiology | Admitting: Cardiology

## 2015-07-09 DIAGNOSIS — Z87891 Personal history of nicotine dependence: Secondary | ICD-10-CM | POA: Insufficient documentation

## 2015-07-09 DIAGNOSIS — I10 Essential (primary) hypertension: Secondary | ICD-10-CM | POA: Diagnosis not present

## 2015-07-09 DIAGNOSIS — R931 Abnormal findings on diagnostic imaging of heart and coronary circulation: Secondary | ICD-10-CM

## 2015-07-09 DIAGNOSIS — Z6841 Body Mass Index (BMI) 40.0 and over, adult: Secondary | ICD-10-CM | POA: Diagnosis not present

## 2015-07-09 DIAGNOSIS — J449 Chronic obstructive pulmonary disease, unspecified: Secondary | ICD-10-CM | POA: Insufficient documentation

## 2015-07-09 DIAGNOSIS — R9439 Abnormal result of other cardiovascular function study: Secondary | ICD-10-CM | POA: Diagnosis not present

## 2015-07-09 DIAGNOSIS — E662 Morbid (severe) obesity with alveolar hypoventilation: Secondary | ICD-10-CM | POA: Diagnosis not present

## 2015-07-09 DIAGNOSIS — I480 Paroxysmal atrial fibrillation: Secondary | ICD-10-CM | POA: Diagnosis not present

## 2015-07-09 DIAGNOSIS — J45909 Unspecified asthma, uncomplicated: Secondary | ICD-10-CM | POA: Diagnosis not present

## 2015-07-09 DIAGNOSIS — E785 Hyperlipidemia, unspecified: Secondary | ICD-10-CM | POA: Insufficient documentation

## 2015-07-09 DIAGNOSIS — R0609 Other forms of dyspnea: Secondary | ICD-10-CM | POA: Diagnosis not present

## 2015-07-09 DIAGNOSIS — Z01818 Encounter for other preprocedural examination: Secondary | ICD-10-CM

## 2015-07-09 DIAGNOSIS — Z7984 Long term (current) use of oral hypoglycemic drugs: Secondary | ICD-10-CM | POA: Insufficient documentation

## 2015-07-09 HISTORY — PX: CARDIAC CATHETERIZATION: SHX172

## 2015-07-09 LAB — GLUCOSE, CAPILLARY: Glucose-Capillary: 160 mg/dL — ABNORMAL HIGH (ref 65–99)

## 2015-07-09 SURGERY — LEFT HEART CATH AND CORONARY ANGIOGRAPHY

## 2015-07-09 MED ORDER — IOPAMIDOL (ISOVUE-370) INJECTION 76%
INTRAVENOUS | Status: DC | PRN
  Administered 2015-07-09: 85 mL via INTRA_ARTERIAL

## 2015-07-09 MED ORDER — LIDOCAINE HCL (PF) 1 % IJ SOLN
INTRAMUSCULAR | Status: DC | PRN
  Administered 2015-07-09: 3 mL

## 2015-07-09 MED ORDER — SODIUM CHLORIDE 0.9 % IV SOLN: 250.0000 mL | INTRAVENOUS | Status: DC | PRN

## 2015-07-09 MED ORDER — IOPAMIDOL (ISOVUE-370) INJECTION 76%
INTRAVENOUS | Status: AC
  Filled 2015-07-09: qty 100

## 2015-07-09 MED ORDER — LIDOCAINE HCL (PF) 1 % IJ SOLN
INTRAMUSCULAR | Status: AC
  Filled 2015-07-09: qty 30

## 2015-07-09 MED ORDER — SODIUM CHLORIDE 0.9% FLUSH: 3.0000 mL | INTRAVENOUS | Status: DC | PRN

## 2015-07-09 MED ORDER — HEPARIN (PORCINE) IN NACL 2-0.9 UNIT/ML-% IJ SOLN
INTRAMUSCULAR | Status: AC
  Filled 2015-07-09: qty 500

## 2015-07-09 MED ORDER — SODIUM CHLORIDE 0.9% FLUSH
3.0000 mL | Freq: Two times a day (BID) | INTRAVENOUS | Status: DC
Start: 2015-07-09 — End: 2015-07-09

## 2015-07-09 MED ORDER — ONDANSETRON HCL 4 MG/2ML IJ SOLN: 4.0000 mg | Freq: Four times a day (QID) | INTRAMUSCULAR | Status: DC | PRN

## 2015-07-09 MED ORDER — MORPHINE SULFATE (PF) 2 MG/ML IV SOLN: 1.0000 mg | INTRAVENOUS | Status: DC | PRN

## 2015-07-09 MED ORDER — SODIUM CHLORIDE 0.9 % WEIGHT BASED INFUSION
3.0000 mL/kg/h | INTRAVENOUS | Status: DC
  Administered 2015-07-09: 3 mL/kg/h via INTRAVENOUS

## 2015-07-09 MED ORDER — SODIUM CHLORIDE 0.9% FLUSH: 3.0000 mL | Freq: Two times a day (BID) | INTRAVENOUS | Status: DC

## 2015-07-09 MED ORDER — SODIUM CHLORIDE 0.9 % WEIGHT BASED INFUSION: 1.0000 mL/kg/h | INTRAVENOUS | Status: DC

## 2015-07-09 MED ORDER — VERAPAMIL HCL 2.5 MG/ML IV SOLN
INTRAVENOUS | Status: AC
  Filled 2015-07-09: qty 2

## 2015-07-09 MED ORDER — VERAPAMIL HCL 2.5 MG/ML IV SOLN
INTRA_ARTERIAL | Status: DC | PRN
  Administered 2015-07-09: 10 mL via INTRA_ARTERIAL

## 2015-07-09 MED ORDER — ACETAMINOPHEN 325 MG PO TABS: 650.0000 mg | ORAL_TABLET | ORAL | Status: DC | PRN

## 2015-07-09 MED ORDER — ASPIRIN 81 MG PO CHEW
CHEWABLE_TABLET | ORAL | Status: AC
  Filled 2015-07-09: qty 1

## 2015-07-09 MED ORDER — HEPARIN (PORCINE) IN NACL 2-0.9 UNIT/ML-% IJ SOLN
INTRAMUSCULAR | Status: DC | PRN
  Administered 2015-07-09: 1500 mL

## 2015-07-09 MED ORDER — HEPARIN SODIUM (PORCINE) 1000 UNIT/ML IJ SOLN
INTRAMUSCULAR | Status: DC | PRN
  Administered 2015-07-09: 6000 [IU] via INTRAVENOUS

## 2015-07-09 MED ORDER — HEPARIN SODIUM (PORCINE) 1000 UNIT/ML IJ SOLN
INTRAMUSCULAR | Status: AC
  Filled 2015-07-09: qty 1

## 2015-07-09 MED ORDER — ASPIRIN 81 MG PO CHEW
81.0000 mg | CHEWABLE_TABLET | ORAL | Status: AC
  Administered 2015-07-09: 81 mg via ORAL

## 2015-07-09 MED ORDER — HEPARIN (PORCINE) IN NACL 2-0.9 UNIT/ML-% IJ SOLN
INTRAMUSCULAR | Status: AC
  Filled 2015-07-09: qty 1000

## 2015-07-09 SURGICAL SUPPLY — 10 items
CATH INFINITI 5FR ANG PIGTAIL (CATHETERS) ×3 IMPLANT
CATH OPTITORQUE TIG 4.0 5F (CATHETERS) ×3 IMPLANT
DEVICE RAD COMP TR BAND LRG (VASCULAR PRODUCTS) ×3 IMPLANT
GLIDESHEATH SLEND A-KIT 6F 22G (SHEATH) ×3 IMPLANT
KIT HEART LEFT (KITS) ×3 IMPLANT
PACK CARDIAC CATHETERIZATION (CUSTOM PROCEDURE TRAY) ×3 IMPLANT
SYR MEDRAD MARK V 150ML (SYRINGE) ×3 IMPLANT
TRANSDUCER W/STOPCOCK (MISCELLANEOUS) ×3 IMPLANT
TUBING CIL FLEX 10 FLL-RA (TUBING) ×3 IMPLANT
WIRE SAFE-T 1.5MM-J .035X260CM (WIRE) ×3 IMPLANT

## 2015-07-09 NOTE — Discharge Instructions (Signed)
Radial Site Care °Refer to this sheet in the next few weeks. These instructions provide you with information about caring for yourself after your procedure. Your health care provider may also give you more specific instructions. Your treatment has been planned according to current medical practices, but problems sometimes occur. Call your health care provider if you have any problems or questions after your procedure. °WHAT TO EXPECT AFTER THE PROCEDURE °After your procedure, it is typical to have the following: °· Bruising at the radial site that usually fades within 1-2 weeks. °· Blood collecting in the tissue (hematoma) that may be painful to the touch. It should usually decrease in size and tenderness within 1-2 weeks. °HOME CARE INSTRUCTIONS °· Take medicines only as directed by your health care provider. °· You may shower 24-48 hours after the procedure or as directed by your health care provider. Remove the bandage (dressing) and gently wash the site with plain soap and water. Pat the area dry with a clean towel. Do not rub the site, because this may cause bleeding. °· Do not take baths, swim, or use a hot tub until your health care provider approves. °· Check your insertion site every day for redness, swelling, or drainage. °· Do not apply powder or lotion to the site. °· Do not flex or bend the affected arm for 24 hours or as directed by your health care provider. °· Do not push or pull heavy objects with the affected arm for 24 hours or as directed by your health care provider. °· Do not lift over 10 lb (4.5 kg) for 5 days after your procedure or as directed by your health care provider. °· Ask your health care provider when it is okay to: °¨ Return to work or school. °¨ Resume usual physical activities or sports. °¨ Resume sexual activity. °· Do not drive home if you are discharged the same day as the procedure. Have someone else drive you. °· You may drive 24 hours after the procedure unless otherwise  instructed by your health care provider. °· Do not operate machinery or power tools for 24 hours after the procedure. °· If your procedure was done as an outpatient procedure, which means that you went home the same day as your procedure, a responsible adult should be with you for the first 24 hours after you arrive home. °· Keep all follow-up visits as directed by your health care provider. This is important. °SEEK MEDICAL CARE IF: °· You have a fever. °· You have chills. °· You have increased bleeding from the radial site. Hold pressure on the site. °SEEK IMMEDIATE MEDICAL CARE IF: °· You have unusual pain at the radial site. °· You have redness, warmth, or swelling at the radial site. °· You have drainage (other than a small amount of blood on the dressing) from the radial site. °· The radial site is bleeding, and the bleeding does not stop after 30 minutes of holding steady pressure on the site. °· Your arm or hand becomes pale, cool, tingly, or numb. °  °This information is not intended to replace advice given to you by your health care provider. Make sure you discuss any questions you have with your health care provider. °  °Document Released: 02/05/2010 Document Revised: 01/24/2014 Document Reviewed: 07/22/2013 °Elsevier Interactive Patient Education ©2016 Elsevier Inc. ° °

## 2015-07-09 NOTE — Interval H&P Note (Signed)
History and Physical Interval Note:  07/09/2015 7:11 AM  Brett Curry  has presented today for surgery, with the diagnosis of abnormal stress test with shortness of breath. The various methods of treatment have been discussed with the patient and family. After consideration of risks, benefits and other options for treatment, the patient has consented to  Procedure(s): Left Heart Cath and Coronary Angiography (N/A) as a surgical intervention .  The patient's history has been reviewed, patient examined, no change in status, stable for surgery.  I have reviewed the patient's chart and labs.  Questions were answered to the patient's satisfaction.    Cath Lab Visit (complete for each Cath Lab visit)  Clinical Evaluation Leading to the Procedure:   ACS: No.  Non-ACS:    Anginal Classification: CCS II - dyspnea  Anti-ischemic medical therapy: Minimal Therapy (1 class of medications)  Non-Invasive Test Results: High-risk stress test findings: cardiac mortality >3%/year  Prior CABG: No previous CABG  Ischemic Symptoms? CCS II (Slight limitation of ordinary activity) Anti-ischemic Medical Therapy? Minimal Therapy (1 class of medications) Non-invasive Test Results? High-risk stress test findings: cardiac mortality >3%/yr Prior CABG? No Previous CABG   Patient Information:   1-2V CAD, no prox LAD  A (7)  Indication: 18; Score: 7   Patient Information:   CTO of 1 vessel, no other CAD  U (5)  Indication: 28; Score: 5   Patient Information:   1V CAD with prox LAD  A (8)  Indication: 34; Score: 8   Patient Information:   2V-CAD with prox LAD  A (8)  Indication: 40; Score: 8   Patient Information:   3V-CAD without LMCA  A (8)  Indication: 46; Score: 8   Patient Information:   3V-CAD without LMCA With Abnormal LV systolic function  A (9)  Indication: 48; Score: 9   Patient Information:   LMCA-CAD  A (9)  Indication: 49; Score: 9   Patient Information:    2V-CAD with prox LAD PCI  A (7)  Indication: 62; Score: 7   Patient Information:   2V-CAD with prox LAD CABG  A (8)  Indication: 62; Score: 8   Patient Information:   3V-CAD without LMCA With Low CAD burden(i.e., 3 focal stenoses, low SYNTAX score) PCI  A (7)  Indication: 63; Score: 7   Patient Information:   3V-CAD without LMCA With Low CAD burden(i.e., 3 focal stenoses, low SYNTAX score) CABG  A (9)  Indication: 63; Score: 9   Patient Information:   3V-CAD without LMCA E06c - Intermediate-high CAD burden (i.e., multiple diffuse lesions, presence of CTO, or high SYNTAX score) PCI  U (4)  Indication: 64; Score: 4   Patient Information:   3V-CAD without LMCA E06c - Intermediate-high CAD burden (i.e., multiple diffuse lesions, presence of CTO, or high SYNTAX score) CABG  A (9)  Indication: 64; Score: 9   Patient Information:   LMCA-CAD With Isolated LMCA stenosis  PCI  U (6)  Indication: 65; Score: 6   Patient Information:   LMCA-CAD With Isolated LMCA stenosis  CABG  A (9)  Indication: 65; Score: 9   Patient Information:   LMCA-CAD Additional CAD, low CAD burden (i.e., 1- to 2-vessel additional involvement, low SYNTAX score) PCI  U (5)  Indication: 66; Score: 5   Patient Information:   LMCA-CAD Additional CAD, low CAD burden (i.e., 1- to 2-vessel additional involvement, low SYNTAX score) CABG  A (9)  Indication: 66; Score: 9   Patient Information:  LMCA-CAD Additional CAD, intermediate-high CAD burden (i.e., 3-vessel involvement, presence of CTO, or high SYNTAX score) PCI  I (3)  Indication: 67; Score: 3   Patient Information:   LMCA-CAD Additional CAD, intermediate-high CAD burden (i.e., 3-vessel involvement, presence of CTO, or high SYNTAX score) CABG  A (9)  Indication: 67; Score: 9    Glenetta Hew

## 2015-07-09 NOTE — H&P (View-Only) (Signed)
Patient ID: Brett Curry, male   DOB: 01-21-1960, 55 y.o.   MRN: [id number]      SUBJECTIVE: The patient returns for follow-up after undergoing cardiovascular testing performed for the evaluation of shortness of breath.  Nuclear MPI study 06/19/15 was high risk with moderate areas of ischemia in the anterolateral wall and apex, LVEF 30-44%.  He has a history of paroxysmal atrial fibrillation, morbid obesity, COPD, prior history of tobacco abuse, and chronic exertional dyspnea, as well as sleep apnea, hyperlipidemia, and obesity hypoventilation syndrome. He was previously evaluated for gastric sleeve surgery.  Echocardiogram 05/20/15 showed normal left ventricular systolic function and regional wall motion, LVEF 55-60%, with moderate left atrial dilatation. It was a poor quality study.   Review of Systems: As per "subjective", otherwise negative.  No Known Allergies  Current Outpatient Prescriptions  Medication Sig Dispense Refill  . albuterol (PROAIR HFA) 108 (90 BASE) MCG/ACT inhaler Inhale 2 puffs into the lungs every 6 (six) hours as needed for wheezing or shortness of breath.    . diltiazem (CARDIZEM) 30 MG tablet Take 1 tablet (30 mg total) by mouth 2 (two) times daily. 180 tablet 3  . glimepiride (AMARYL) 4 MG tablet Take 4 mg by mouth daily before breakfast.    . insulin detemir (LEVEMIR) 100 UNIT/ML injection Inject 0.4 mLs (40 Units total) into the skin at bedtime. 10 mL 11  . ipratropium-albuterol (DUONEB) 0.5-2.5 (3) MG/3ML SOLN Take 3 mLs by nebulization 4 (four) times daily.    . metFORMIN (GLUCOPHAGE) 1000 MG tablet Take 1,000 mg by mouth 2 (two) times daily.      . Omega-3 Fatty Acids (FISH OIL) 1200 MG CAPS Take 1,200 mg by mouth 2 (two) times daily.    . pioglitazone (ACTOS) 15 MG tablet Take 30 mg by mouth daily.     . traMADol (ULTRAM) 50 MG tablet Take 50 mg by mouth at bedtime.  4  . umeclidinium-vilanterol (ANORO ELLIPTA) 62.5-25 MCG/INH AEPB Inhale 1 puff into the  lungs daily. 60 each 5   No current facility-administered medications for this visit.    Past Medical History  Diagnosis Date  . Hyperlipidemia   . Type II or unspecified type diabetes mellitus without mention of complication, not stated as uncontrolled   . Asthma   . COPD (chronic obstructive pulmonary disease) (HCC)   . Morbid obesity due to excess calories (HCC) 07/27/2014    Past Surgical History  Procedure Laterality Date  . Shoulder surgery      right  . Knee surgery      right  . Tonsillectomy      Social History   Social History  . Marital Status: Married    Spouse Name: N/A  . Number of Children: N/A  . Years of Education: N/A   Occupational History  . truck driver    Social History Main Topics  . Smoking status: Former Smoker -- 2.00 packs/day for 34 years    Types: Cigarettes    Quit date: 03/28/2015  . Smokeless tobacco: Never Used     Comment: started at age 55.   Marland Kitchen. Alcohol Use: No  . Drug Use: No  . Sexual Activity: Yes   Other Topics Concern  . Not on file   Social History Narrative     Filed Vitals:   07/06/15 1604  BP: 148/72  Pulse: 52  Height: 6' (1.829 m)  Weight: 426 lb (193.232 kg)  SpO2: 95%    PHYSICAL EXAM General:  NAD HEENT: Normal. Neck: JVP difficult to assess. Lungs: Poor air movement. Insp/exp wheezes, no rales. CV: Distant heart tones. 1+ pretibial and periankle edema with venous stasis dermatitis.  Abdomen: Morbidly obese.  Neurologic: Alert and oriented.  Psych: Normal affect. Skin: venous stasis dermatitis of both legs. Musculoskeletal: No gross deformities.   ECG: Most recent ECG reviewed.      ASSESSMENT AND PLAN: 1. Paroxsymal atrial fibrillation: Continue diltiazem 30 mg bid. Given lung disease (COPD), will hold off on amiodarone for now. CHA2DS2Vasc score 1 (diabetes), thus anticoagulation not indicated.   2. Abnormal stress test/preop clearance/shortness of breath: High risk Lexiscan Myoview  stress test as detailed above. Will arrange for coronary angiography.  3. Essential HTN: Elevated. Will monitor.   Dispo: fu after cath.   Prentice DockerSuresh Koneswaran, M.D., F.A.C.C.

## 2015-07-20 ENCOUNTER — Telehealth: Payer: Self-pay | Admitting: Adult Health

## 2015-07-20 NOTE — Telephone Encounter (Signed)
Per verbal order from TP after reviewing ONO on CPAP with 5L O2 Majority of the test is above 90% until last hour of test Continue same setting with CPAP and 5L O2 Would like to do CPAP titration if his is okay with it Discuss in more detail at follow up VS  Called spoke with pt. Reviewed results and recs. Pt refused to proceed with the CPAP titration stating he would like to wait. He voiced understanding and had no further questions. Nothing further needed.

## 2015-07-30 ENCOUNTER — Encounter: Payer: Medicare Other | Attending: Surgery | Admitting: Nutrition

## 2015-07-30 ENCOUNTER — Encounter: Payer: Self-pay | Admitting: Adult Health

## 2015-07-30 DIAGNOSIS — IMO0002 Reserved for concepts with insufficient information to code with codable children: Secondary | ICD-10-CM

## 2015-07-30 DIAGNOSIS — E1165 Type 2 diabetes mellitus with hyperglycemia: Secondary | ICD-10-CM

## 2015-07-30 DIAGNOSIS — E118 Type 2 diabetes mellitus with unspecified complications: Secondary | ICD-10-CM

## 2015-07-30 DIAGNOSIS — Z794 Long term (current) use of insulin: Secondary | ICD-10-CM

## 2015-07-30 NOTE — Patient Instructions (Signed)
During the appointment today the following Pre-Op Goals were reviewed with the patient: Maintain or lose weight as instructed by your surgeon Make healthy food choices Begin to limit portion sizes Limited concentrated sugars and fried foods Keep fat/sugar in the single digits per serving on   food labels  Practice CHEWING your food  (aim for 30 chews per bite or until applesauce consistency) Practice not drinking 15 minutes before, during, and 30 minutes after each meal/snack Avoid all carbonated beverages  Avoid/limit caffeinated beverages  Avoid all sugar-sweetened beverages Consume 3 meals per day; eat every 3-5 hours Make a list of non-food related activities Aim for 64-100 ounces of FLUID daily  Aim for at least 60-80 grams of PROTEIN daily Look for a liquid protein source that contain ?15 g protein and ?5 g carbohydrate  (ex: shakes, drinks, shots)   Patient-Centered Goals: Would like to improve his health overall, get knees replaced so he can exercise 10 confidence/10 importance scale     Goals Keep up the good work!!   1. Follow Plate Method 2. Cut out ice cream. 3. Cut down on portion sizes 4. Increase protein and vegetables. 5. Lose 1-2 lbs per week. 6. Chew  Each bite 30 times.

## 2015-07-30 NOTE — Progress Notes (Signed)
  Pre-Op Assessment Visit:  Pre-Operative Sleeve Gastrectomy Surgery  Medical Nutrition Therapy:  Appt start time: 1030  End time: 1045 Patient was seen on 05/04/2015 for Pre-Operative Nutrition Assessment. [facility] on 05/04/2015.  A1C 9.2%.  Reducing his SF kooilaid. He eating slower, eating more fresh fruits and vegetables. Has cut out eating between meals mostly. Lost 7 lbs. He has cut out snacks and doesn't drink diet sodas. Occasionally has some ice cream but not on regular basis. Has cut down portions on all his foods. Is continuing to work on his emotional eating and becoming aware of his level of fullness. STill on 40 units of Levemir, Actos, Glimepiride and Metformin. Denies low blood sugars.  BS are better. 7 day avg 160's. Feels better overall. Still problems breathing and difficulty walking due to needing knee replacements.   Preferred Learning Style:   No preference indicated   Learning Readiness:   Ready  Handouts given during visit include:  Pre-Op Goals Bariatric Surgery Protein Shakes   During the appointment today the following Pre-Op Goals were reviewed with the patient: Maintain or lose weight as instructed by your surgeon Make healthy food choices Begin to limit portion sizes Limited concentrated sugars and fried foods Keep fat/sugar in the single digits per serving on   food labels  Practice CHEWING your food  (aim for 30 chews per bite or until applesauce consistency) Practice not drinking 15 minutes before, during, and 30 minutes after each meal/snack Avoid all carbonated beverages  Avoid/limit caffeinated beverages  Avoid all sugar-sweetened beverages Consume 3 meals per day; eat every 3-5 hours Make a list of non-food related activities Aim for 64-100 ounces of FLUID daily  Aim for at least 60-80 grams of PROTEIN daily Look for a liquid protein source that  contain ?15 g protein and ?5 g carbohydrate  (ex: shakes, drinks, shots)   Patient-Centered Goals: Would like to improve his health overall, get knees replaced so he can exercise 10 confidence/10 importance scale     Goals Keep up the good work!!   1. Follow Plate Method 2. Cut out ice cream. 3. Cut down on portion sizes 4. Increase protein and vegetables. 5. Lose 1-2 lbs per week. 6. Chew  Each bite 30 times.  Demonstrated degree of understanding via:  Teach Back  Teaching Method Utilized:  Visual Auditory Hands on  Barriers to learning/adherence to lifestyle change: none  [facility] to [facility] the [facility] to enroll in Pre op/ Post-Op Nutrition Education when surgery date is scheduled.

## 2015-08-20 ENCOUNTER — Ambulatory Visit: Payer: Self-pay | Admitting: Cardiovascular Disease

## 2015-08-31 ENCOUNTER — Encounter: Payer: Medicare Other | Attending: Surgery | Admitting: Nutrition

## 2015-08-31 VITALS — Wt >= 6400 oz

## 2015-08-31 DIAGNOSIS — E118 Type 2 diabetes mellitus with unspecified complications: Secondary | ICD-10-CM

## 2015-08-31 DIAGNOSIS — E1165 Type 2 diabetes mellitus with hyperglycemia: Secondary | ICD-10-CM

## 2015-08-31 DIAGNOSIS — IMO0002 Reserved for concepts with insufficient information to code with codable children: Secondary | ICD-10-CM

## 2015-08-31 NOTE — Progress Notes (Signed)
  Pre-Op Assessment Visit:  Pre-Operative Sleeve Gastrectomy Surgery  Medical Nutrition Therapy:  Appt start time: 1030  End time: 1045 Patient was seen on 05/04/2015 for Pre-Operative Nutrition Assessment. [facility] on 05/04/2015.  A1C 9.2%.   Wt down 11 lbs since last visit. Still on 40 units of Levemir, Actos, Glimepiride and Metformin. Denies low blood sugars. 10 lbs weight loss this past month. He brought BS meter. BS 116-233 mg in am and 180-200's in pm. He is eating more SF desserts and advised against it due to elevated BS and that SF doesn't mean carb free. He is eating more fresh fruits and vegetables. Still avoiding snacks but sometimes eating snack after supper, effecting am blood sugar. Compliant with medications.     He has had to care for his wife who fell and broke her arm in 2 places and so has been out of routine this past month and not eating as balanced meals as he was.    Still has sever pain in knees. Drinking diluted SF water but needs to switch to straight water. He is chewing more thoroughly and eating slower.   Feels better overall. Still problems breathing and difficulty walking due to needing knee replacements.   Preferred Learning Style:   No preference indicated   Learning Readiness:   Ready  Handouts given during visit include:  Pre-Op Goals Bariatric Surgery Protein Shakes   During the appointment today the following Pre-Op Goals were reviewed with the patient: Maintain or lose weight as instructed by your surgeon Make healthy food choices Begin to limit portion sizes Limited concentrated sugars and fried foods Keep fat/sugar in the single digits per serving on   food labels  Practice CHEWING your food  (aim for 30 chews per bite or until applesauce consistency) Practice not drinking 15 minutes before, during, and 30 minutes after each meal/snack Avoid all  carbonated beverages  Avoid/limit caffeinated beverages  Avoid all sugar-sweetened beverages Consume 3 meals per day; eat every 3-5 hours Make a list of non-food related activities Aim for 64-100 ounces of FLUID daily  Aim for at least 60-80 grams of PROTEIN daily Look for a liquid protein source that contain ?15 g protein and ?5 g carbohydrate  (ex: shakes, drinks, shots)   Patient-Centered Goals: Would like to improve his health overall, get knees replaced so he can exercise 10 confidence/10 importance scale     Goals Keep up the good work!!   Goals 1. Avoid late night snacking and avoid sugar free stuff 2. Increase low carb vegetables. 3. Drink only water and avoid SF koolaid  4. Increase physical actvity as tolerated. 5. Chew foods 20-30 times per bite. 6. Avoid salty foods.  Demonstrated degree of understanding via:  Teach Back  Teaching Method Utilized:  Visual Auditory Hands on  Barriers to learning/adherence to lifestyle change: none  Patient to call the [facility] to enroll in Pre op/ Post-Op Nutrition Education when surgery date is scheduled.

## 2015-09-04 ENCOUNTER — Ambulatory Visit: Payer: Self-pay | Admitting: Cardiovascular Disease

## 2015-09-17 NOTE — Patient Instructions (Signed)
Goals 1. Avoid late night snacking and avoid sugar free stuff 2. Increase low carb vegetables. 3. Drink only water and avoid SF koolaid  4. Increase physical actvity as tolerated. 5. Chew foods 20-30 times per bite. 6. Avoid salty foods.

## 2015-09-24 ENCOUNTER — Encounter: Payer: Medicare Other | Attending: Surgery | Admitting: Nutrition

## 2015-09-24 ENCOUNTER — Encounter: Payer: Self-pay | Admitting: Nutrition

## 2015-09-24 VITALS — Ht 72.0 in | Wt >= 6400 oz

## 2015-09-24 DIAGNOSIS — E1165 Type 2 diabetes mellitus with hyperglycemia: Secondary | ICD-10-CM

## 2015-09-24 DIAGNOSIS — Z794 Long term (current) use of insulin: Secondary | ICD-10-CM

## 2015-09-24 DIAGNOSIS — IMO0002 Reserved for concepts with insufficient information to code with codable children: Secondary | ICD-10-CM

## 2015-09-24 DIAGNOSIS — E118 Type 2 diabetes mellitus with unspecified complications: Secondary | ICD-10-CM

## 2015-09-24 NOTE — Patient Instructions (Addendum)
Goals 1. Increase fresh vegetable to 2 servings BID. 2.  Reduce sugar free sweetener to 2 spoons to gal instead of 4 spoons 3. Lose 8 lbs in the next month 4. Get A1C to 7%.

## 2015-09-24 NOTE — Progress Notes (Signed)
   Pre-Op Assessment Visit:  Pre-Operative Sleeve Gastrectomy Surgery  Medical Nutrition Therapy:  Appt start time: 1030  End time: 1045 Patient was seen on 05/04/2015 for Pre-Operative Nutrition Assessment. [facility] on 05/04/2015.     Wt up 8 lbs since last visit. States they had some birthday parties to attend. A1C down to 8.3% from 9.4%. Saw Dr. Ouida SillsFagan today. Increased Lantus today to 45 units. Discontinued Glimiperide.  Stopped 130 mg of Cardizem today and only taking 30 mg/dl. Taking Metformin and ? Actos.     Has reduced concentration of his sugar free flavor packets in his water now. Not able to exercise due to his severe main in his legs and knees and breathing issues.      Hasn't been eating as much low carb vegetables as much. BS are overall better. Willing to get his weight back off.  Denies any swelling in his feet. Not on any fluid pills. BP good at 114/62 at Dr. Ouida SillsFagan office today he states.    Still has sever pain in knees. Drinking diluted SF water but needs to switch to straight water. He is chewing more thoroughly and eating slower. Feels better overall. Still problems breathing and difficulty walking due to needing knee replacements.   Preferred Learning Style:   No preference indicated   Learning Readiness:   Ready  Handouts given during visit include:  Pre-Op Goals Bariatric Surgery Protein Shakes   During the appointment today the following Pre-Op Goals were reviewed with the patient: Maintain or lose weight as instructed by your surgeon Make healthy food choices Begin to limit portion sizes Limited concentrated sugars and fried foods Keep fat/sugar in the single digits per serving on   food labels  Practice CHEWING your food  (aim for 30 chews per bite or until applesauce consistency) Practice not drinking 15 minutes before, during, and 30 minutes after each  meal/snack Avoid all carbonated beverages  Avoid/limit caffeinated beverages  Avoid all sugar-sweetened beverages Consume 3 meals per day; eat every 3-5 hours Make a list of non-food related activities Aim for 64-100 ounces of FLUID daily  Aim for at least 60-80 grams of PROTEIN daily Look for a liquid protein source that contain ?15 g protein and ?5 g carbohydrate  (ex: shakes, drinks, shots)   Patient-Centered Goals: Would like to improve his health overall, get knees replaced so he can exercise 10 confidence/10 importance scale     Goals Keep up the good work!!   Goals 1. Increase fresh vegetable to 2 servings BID. 2.  Reduce sugar free sweetener to 2 spoons to gal instead of 4 spoons 3. Lose 8 lbs in the next month 4. Get A1C to 7%.  Demonstrated degree of understanding via:  Teach Back  Teaching Method Utilized:  Visual Auditory Hands on  Barriers to learning/adherence to lifestyle change: none  Patient to call the [facility] to enroll in Pre op/ Post-Op Nutrition Education when surgery date is scheduled.   [facility] [employer]

## 2015-10-28 ENCOUNTER — Ambulatory Visit: Payer: Self-pay | Admitting: Nutrition

## 2015-10-28 ENCOUNTER — Encounter: Payer: Medicare Other | Attending: Surgery | Admitting: Nutrition

## 2015-10-28 DIAGNOSIS — E1165 Type 2 diabetes mellitus with hyperglycemia: Secondary | ICD-10-CM

## 2015-10-28 DIAGNOSIS — IMO0002 Reserved for concepts with insufficient information to code with codable children: Secondary | ICD-10-CM

## 2015-10-28 DIAGNOSIS — Z794 Long term (current) use of insulin: Secondary | ICD-10-CM

## 2015-10-28 DIAGNOSIS — E118 Type 2 diabetes mellitus with unspecified complications: Secondary | ICD-10-CM

## 2015-10-28 NOTE — Progress Notes (Signed)
   Pre-Op Assessment Visit:  Pre-Operative Sleeve Gastrectomy Surgery  Medical Nutrition Therapy:  Appt start time: 1030  End time: 1045 Patient was seen on 05/04/2015 for Pre-Operative Nutrition Assessment. Assessment [facility] on 05/04/2015.        Lost 8 lbs. Hasn't been checking blood sugars.  Takes 45 units of Levemir  once a day.  Changes he has made since last visi:  Got away from sweets again and cut back on portions and cut out snacks.. Increased low carb vegetables. Drinking watered down crystal light. Not drinking alcohol. Staying committed to making lifestyle changes.  Breakfast: Bowl of Cherrios with 1% MILK Lunch:  1/2 plate of shrimp and mixed vegetables, crystal light Dinner: Hamburger helper, crystal light   Not able to exercise. Breathing is very difficult. Has oxygen he wears at home. Can't afford portable oxgen. Doesn't want to carry around O2 tank. Struggles with finances of his medications and MD visits. On disability.  Has extreme pain walking and moving due to knees and morbid obesity.   Needs more lower carb vegetable and needs to avoid high salty foods. Needs to work on increased chewing of foods. Preferred Learning Style:   No preference indicated   Learning Readiness:   Ready  Handouts given during visit include:  Pre-Op Goals Bariatric Surgery Protein Shakes   During the appointment today the following Pre-Op Goals were reviewed with the patient: Maintain or lose weight as instructed by your surgeon Make healthy food choices Begin to limit portion sizes Limited concentrated sugars and fried foods Keep fat/sugar in the single digits per serving on   food labels  Practice CHEWING your food  (aim for 30 chews per bite or until applesauce consistency) Practice not drinking 15 minutes before, during, and 30 minutes after each meal/snack Avoid all carbonated beverages   Avoid/limit caffeinated beverages  Avoid all sugar-sweetened beverages Consume 3 meals per day; eat every 3-5 hours Make a list of non-food related activities Aim for 64-100 ounces of FLUID daily  Aim for at least 60-80 grams of PROTEIN daily Look for a liquid protein source that contain ?15 g protein and ?5 g carbohydrate  (ex: shakes, drinks, shots)   Patient-Centered Goals: Would like to improve his health overall, get knees replaced so he can exercise 10 confidence/10 importance scale     Goals Keep up the good work!!   Goals 1. Increase fresh vegetable to 2 servings BID. 2.  Reduce sugar free sweetener to 2 spoons to gal instead of 4 spoons 3. Lose 8 lbs in the next month 4. Get A1C to 7%.  Demonstrated degree of understanding via:  Teach Back  Teaching Method Utilized:  Visual Auditory Hands on  Barriers to learning/adherence to lifestyle change: none  Patient to call the [facility] to enroll in Pre op/ Post-Op Nutrition Education when surgery date is scheduled.   [facility] [employer]

## 2015-10-28 NOTE — Patient Instructions (Signed)
  During the appointment today the following Pre-Op Goals were reviewed with the patient: Maintain or lose weight as instructed by your surgeon Make healthy food choices Begin to limit portion sizes Limited concentrated sugars and fried foods Keep fat/sugar in the single digits per serving on   food labels Practice CHEWING your food  (aim for 30 chews per bite or until applesauce consistency) Practice not drinking 15 minutes before, during, and 30 minutes after each meal/snack Avoid all carbonated beverages  Avoid/limit caffeinated beverages  Avoid all sugar-sweetened beverages Consume 3 meals per day; eat every 3-5 hours Make a list of non-food related activities Aim for 64-100 ounces of FLUID daily  Aim for at least 60-80 grams of PROTEIN daily Look for a liquid protein source that contain ?15 g protein and ?5 g carbohydrate  (ex: shakes, drinks, shots)  

## 2015-12-08 ENCOUNTER — Ambulatory Visit: Payer: Self-pay | Admitting: Cardiovascular Disease

## 2015-12-15 ENCOUNTER — Ambulatory Visit (INDEPENDENT_AMBULATORY_CARE_PROVIDER_SITE_OTHER): Payer: No Typology Code available for payment source | Admitting: Psychiatry

## 2015-12-15 ENCOUNTER — Ambulatory Visit (INDEPENDENT_AMBULATORY_CARE_PROVIDER_SITE_OTHER): Payer: Medicare Other | Admitting: Pulmonary Disease

## 2015-12-15 ENCOUNTER — Encounter: Payer: Self-pay | Admitting: Pulmonary Disease

## 2015-12-15 VITALS — BP 134/80 | HR 91 | Ht 72.0 in | Wt >= 6400 oz

## 2015-12-15 DIAGNOSIS — F509 Eating disorder, unspecified: Secondary | ICD-10-CM | POA: Diagnosis not present

## 2015-12-15 DIAGNOSIS — E662 Morbid (severe) obesity with alveolar hypoventilation: Secondary | ICD-10-CM

## 2015-12-15 DIAGNOSIS — J9611 Chronic respiratory failure with hypoxia: Secondary | ICD-10-CM | POA: Diagnosis not present

## 2015-12-15 DIAGNOSIS — G4733 Obstructive sleep apnea (adult) (pediatric): Secondary | ICD-10-CM | POA: Diagnosis not present

## 2015-12-15 DIAGNOSIS — Z01818 Encounter for other preprocedural examination: Secondary | ICD-10-CM

## 2015-12-15 DIAGNOSIS — J449 Chronic obstructive pulmonary disease, unspecified: Secondary | ICD-10-CM

## 2015-12-15 MED ORDER — FLUTICASONE-SALMETEROL 250-50 MCG/DOSE IN AEPB: 1.0000 | INHALATION_SPRAY | Freq: Two times a day (BID) | RESPIRATORY_TRACT | 5 refills | Status: AC

## 2015-12-15 NOTE — Progress Notes (Signed)
Current Outpatient Prescriptions on File Prior to Visit  Medication Sig  . diltiazem (CARDIZEM CD) 180 MG 24 hr capsule Take 180 mg by mouth daily.   Marland Kitchen. glimepiride (AMARYL) 4 MG tablet Take 4 mg by mouth daily before breakfast.  . insulin detemir (LEVEMIR) 100 UNIT/ML injection Inject 0.4 mLs (40 Units total) into the skin at bedtime.  Marland Kitchen. ipratropium-albuterol (DUONEB) 0.5-2.5 (3) MG/3ML SOLN Take 3 mLs by nebulization 2 (two) times daily as needed (for wheezing or shortness of breath).   . metFORMIN (GLUCOPHAGE) 1000 MG tablet Take 1,000 mg by mouth at bedtime.   . Omega-3 Fatty Acids (FISH OIL) 1200 MG CAPS Take 1,200 mg by mouth 2 (two) times daily.  . OXYGEN Inhale 5 L into the lungs.  . pioglitazone (ACTOS) 30 MG tablet Take 30 mg by mouth daily.   . pravastatin (PRAVACHOL) 40 MG tablet 40 mg at bedtime.  . SitaGLIPtin-MetFORMIN HCl [phone] MG TB24 Take 1 tablet by mouth every morning.  . traMADol (ULTRAM) 50 MG tablet Take 50 mg by mouth at bedtime.   No current facility-administered medications on file prior to visit.     Chief Complaint  Patient presents with  . Follow-up    Wears CPAP nightly. Denies problems with mask/pressure. DME: [facility] ;   Pt has not been taking any inhalers since last OV, pt advised to stop Advair and Symbicort and start Anoro. Pt states that Anoro was not covered by insurance. Pt states that he was only using the Advair and Symbicort PRN before being taken off. Wears O2 5Liters 24/7. O2 sats are 91% at rest on room air today.l    Sleep tests PSG 08/05/09 >> AHI 90 Auto CPAP 11/15/15 to 12/14/15 >> used on 28 of 30 nights with average 2 hrs 56 min.  Average AHI 1.8 with median CPAP 11 and 95 th percentile CPAP 16 cm H2O  Pulmonary tests ABG 04/08/15 >> pH 7.36, PCO2 80.1, PO2 69.8 PFT 06/10/15 >> FEV1 1.18 (29%), FEV1% 43, TLC 6.09 (82%), DLCO 55%  Cardiac tests Echo 05/20/15 >> mild LVH, EF 55 to 60%  Past medical history HLD, DM, COPD/Asthma  Past  surgical hx, Allergies, Family hx, Social hx all reviewed  Vital signs BP 134/80 (BP Location: Left Arm, Cuff Size: Normal)   Pulse 91   Ht 6' (1.829 m)   Wt (!) 420 lb (190.5 kg)   SpO2 91%   BMI 56.96 kg/m   History of Present Illness: Brett Curry is a 55 y.o. male former smoker with COPD/emphysema, OSA, obesity hypoventilation syndrome and chronic respiratory failure.  He is being evaluated for bariatric surgery.  He stopped smoking several months ago and breathing is better.  He still gets wheezing and cough, but not as bad as before.  He couldn't afford anoro and never used this.  He has been using prn duoneb and this helps.  He got flu and pneumovax.  He gets about 3 hours sleep at a stretch.  He has trouble sleeping due to back and leg pains.  He uses oxygen when sleeping.  He takes cat naps during the day, but doesn't use CPAP then.  Physical Exam:  General - No distress, sitting in wheel chair ENT - No sinus tenderness, no oral exudate, no LAN, wears dentures, MP 4 Cardiac - s1s2 regular, no murmur Chest - faint b/l wheeze Back - No focal tenderness Abd - Soft, non-tender Ext - No edema Neuro - Normal strength Skin -  chronic venous stasis changes Psych - normal mood, and behavior   Assessment/Plan:  Obstructive sleep apnea. - He is compliant with CPAP and reports benefit - continue auto CPAP - discussed importance of consolidating his sleep  COPD with chronic bronchitis. - restart fluticasone-salmeterol   Chronic hypoxic/hypercapnic respiratory failure 2nd to OHS and COPD. - continue CPAP and supplemental oxygen at night  Obesity. - he is being evaluated for weight loss surgery - I explained how the majority of his health problems are related to his weight - he is at higher risk for perioperative pulmonary complications, but these are not prohibitive.  He understands these risks, and is willing to accept risk involved with bariatric surgery.  His quality  of life is significantly impacted by his weight related health issues   Patient Instructions  Fluticasone-salmeterol one puff twice per day, and rinse mouth after each use  Follow up in 6 months    Coralyn HellingVineet Elza Varricchio, MD [facility] Pager:  [phone] 12/15/2015, 9:36 AM

## 2015-12-15 NOTE — Patient Instructions (Signed)
Fluticasone-salmeterol one puff twice per day, and rinse mouth after each use  Follow up in 6 months

## 2016-01-21 ENCOUNTER — Telehealth: Payer: Self-pay | Admitting: Nutrition

## 2016-01-21 NOTE — Telephone Encounter (Signed)
TC to pt to see if he had his gastric bypass surgery. He noted he is scheduled to meet with staff end of this month and maybe have it done in February. Reminded him he would meet with RD in [place] on the bariatric team pre and post op of his surgery. He verbalized understanding. Encouraged him to call if questions.

## 2016-02-22 ENCOUNTER — Emergency Department (HOSPITAL_COMMUNITY): Payer: Medicare Other

## 2016-02-22 ENCOUNTER — Inpatient Hospital Stay (HOSPITAL_COMMUNITY)
Admission: EM | Admit: 2016-02-22 | Discharge: 2016-02-27 | DRG: 190 | Disposition: A | Discharge status: Home / Self Care | Payer: Medicare Other | Attending: Internal Medicine | Admitting: Internal Medicine

## 2016-02-22 ENCOUNTER — Encounter (HOSPITAL_COMMUNITY): Payer: Self-pay | Admitting: Emergency Medicine

## 2016-02-22 DIAGNOSIS — Z79891 Long term (current) use of opiate analgesic: Secondary | ICD-10-CM | POA: Diagnosis not present

## 2016-02-22 DIAGNOSIS — Z825 Family history of asthma and other chronic lower respiratory diseases: Secondary | ICD-10-CM | POA: Diagnosis not present

## 2016-02-22 DIAGNOSIS — E662 Morbid (severe) obesity with alveolar hypoventilation: Secondary | ICD-10-CM | POA: Diagnosis present

## 2016-02-22 DIAGNOSIS — E785 Hyperlipidemia, unspecified: Secondary | ICD-10-CM | POA: Diagnosis present

## 2016-02-22 DIAGNOSIS — J189 Pneumonia, unspecified organism: Secondary | ICD-10-CM | POA: Diagnosis present

## 2016-02-22 DIAGNOSIS — Z79899 Other long term (current) drug therapy: Secondary | ICD-10-CM

## 2016-02-22 DIAGNOSIS — E1165 Type 2 diabetes mellitus with hyperglycemia: Secondary | ICD-10-CM | POA: Diagnosis not present

## 2016-02-22 DIAGNOSIS — J440 Chronic obstructive pulmonary disease with acute lower respiratory infection: Secondary | ICD-10-CM | POA: Diagnosis present

## 2016-02-22 DIAGNOSIS — J9690 Respiratory failure, unspecified, unspecified whether with hypoxia or hypercapnia: Secondary | ICD-10-CM

## 2016-02-22 DIAGNOSIS — Z7951 Long term (current) use of inhaled steroids: Secondary | ICD-10-CM

## 2016-02-22 DIAGNOSIS — Z9119 Patient's noncompliance with other medical treatment and regimen: Secondary | ICD-10-CM | POA: Diagnosis not present

## 2016-02-22 DIAGNOSIS — J9621 Acute and chronic respiratory failure with hypoxia: Secondary | ICD-10-CM | POA: Diagnosis present

## 2016-02-22 DIAGNOSIS — F17210 Nicotine dependence, cigarettes, uncomplicated: Secondary | ICD-10-CM | POA: Diagnosis present

## 2016-02-22 DIAGNOSIS — E875 Hyperkalemia: Secondary | ICD-10-CM | POA: Diagnosis present

## 2016-02-22 DIAGNOSIS — Z6841 Body Mass Index (BMI) 40.0 and over, adult: Secondary | ICD-10-CM

## 2016-02-22 DIAGNOSIS — Z9981 Dependence on supplemental oxygen: Secondary | ICD-10-CM | POA: Diagnosis not present

## 2016-02-22 DIAGNOSIS — I878 Other specified disorders of veins: Secondary | ICD-10-CM | POA: Diagnosis present

## 2016-02-22 DIAGNOSIS — Z8249 Family history of ischemic heart disease and other diseases of the circulatory system: Secondary | ICD-10-CM

## 2016-02-22 DIAGNOSIS — E872 Acidosis: Secondary | ICD-10-CM | POA: Diagnosis present

## 2016-02-22 DIAGNOSIS — J9622 Acute and chronic respiratory failure with hypercapnia: Secondary | ICD-10-CM | POA: Diagnosis present

## 2016-02-22 DIAGNOSIS — Z794 Long term (current) use of insulin: Secondary | ICD-10-CM | POA: Diagnosis not present

## 2016-02-22 DIAGNOSIS — R0602 Shortness of breath: Secondary | ICD-10-CM | POA: Diagnosis present

## 2016-02-22 DIAGNOSIS — I482 Chronic atrial fibrillation, unspecified: Secondary | ICD-10-CM

## 2016-02-22 DIAGNOSIS — R0600 Dyspnea, unspecified: Secondary | ICD-10-CM

## 2016-02-22 DIAGNOSIS — J441 Chronic obstructive pulmonary disease with (acute) exacerbation: Principal | ICD-10-CM | POA: Diagnosis present

## 2016-02-22 HISTORY — DX: Unspecified atrial fibrillation: I48.91

## 2016-02-22 LAB — COMPREHENSIVE METABOLIC PANEL
ALT: 18 U/L (ref 17–63)
AST: 15 U/L (ref 15–41)
Albumin: 3.4 g/dL — ABNORMAL LOW (ref 3.5–5.0)
Alkaline Phosphatase: 59 U/L (ref 38–126)
Anion gap: 7 (ref 5–15)
BUN: 16 mg/dL (ref 6–20)
CHLORIDE: 89 mmol/L — AB (ref 101–111)
CO2: 40 mmol/L — ABNORMAL HIGH (ref 22–32)
Calcium: 9.1 mg/dL (ref 8.9–10.3)
Creatinine, Ser: 0.57 mg/dL — ABNORMAL LOW (ref 0.61–1.24)
Glucose, Bld: 281 mg/dL — ABNORMAL HIGH (ref 65–99)
POTASSIUM: 5.4 mmol/L — AB (ref 3.5–5.1)
Sodium: 136 mmol/L (ref 135–145)
Total Bilirubin: 0.5 mg/dL (ref 0.3–1.2)
Total Protein: 7.3 g/dL (ref 6.5–8.1)

## 2016-02-22 LAB — CBC WITH DIFFERENTIAL/PLATELET
Basophils Absolute: 0 10*3/uL (ref 0.0–0.1)
Basophils Relative: 0 %
Eosinophils Absolute: 0 10*3/uL (ref 0.0–0.7)
Eosinophils Relative: 0 %
HCT: 46.5 % (ref 39.0–52.0)
HEMOGLOBIN: 13.9 g/dL (ref 13.0–17.0)
LYMPHS ABS: 0.7 10*3/uL (ref 0.7–4.0)
LYMPHS PCT: 7 %
MCH: 29.4 pg (ref 26.0–34.0)
MCHC: 29.9 g/dL — ABNORMAL LOW (ref 30.0–36.0)
MCV: 98.5 fL (ref 78.0–100.0)
Monocytes Absolute: 0.4 10*3/uL (ref 0.1–1.0)
Monocytes Relative: 4 %
NEUTROS PCT: 89 %
Neutro Abs: 9.4 10*3/uL — ABNORMAL HIGH (ref 1.7–7.7)
Platelets: 150 10*3/uL (ref 150–400)
RBC: 4.72 MIL/uL (ref 4.22–5.81)
RDW: 16.2 % — ABNORMAL HIGH (ref 11.5–15.5)
WBC: 10.5 10*3/uL (ref 4.0–10.5)

## 2016-02-22 LAB — BASIC METABOLIC PANEL
ANION GAP: 6 (ref 5–15)
BUN: 16 mg/dL (ref 6–20)
CALCIUM: 9 mg/dL (ref 8.9–10.3)
CHLORIDE: 89 mmol/L — AB (ref 101–111)
CO2: 42 mmol/L — ABNORMAL HIGH (ref 22–32)
CREATININE: 0.59 mg/dL — AB (ref 0.61–1.24)
GFR calc Af Amer: >60 mL/min (ref >60)
GFR calc non Af Amer: >60 mL/min (ref >60)
GLUCOSE: 303 mg/dL — AB (ref 65–99)
Potassium: 5.1 mmol/L (ref 3.5–5.1)
Sodium: 137 mmol/L (ref 135–145)

## 2016-02-22 LAB — URINALYSIS, ROUTINE W REFLEX MICROSCOPIC
BILIRUBIN URINE: NEGATIVE
Glucose, UA: 500 mg/dL — AB
HGB URINE DIPSTICK: NEGATIVE
Ketones, ur: NEGATIVE mg/dL
Leukocytes, UA: NEGATIVE
Nitrite: NEGATIVE
PH: 5.5 (ref 5.0–8.0)
Protein, ur: NEGATIVE mg/dL

## 2016-02-22 LAB — BLOOD GAS, ARTERIAL
ACID-BASE EXCESS: 14 mmol/L — AB (ref 0.0–2.0)
Bicarbonate: 33.1 mmol/L — ABNORMAL HIGH (ref 20.0–28.0)
DELIVERY SYSTEMS: POSITIVE
DRAWN BY: 23534
Expiratory PAP: 6
FIO2: 0.45
Inspiratory PAP: 20
Mode: POSITIVE
O2 Content: 45 L/min
O2 Saturation: 68.7 %
pCO2 arterial: 101 mmHg (ref 32.0–48.0)
pH, Arterial: 7.241 — ABNORMAL LOW (ref 7.350–7.450)
pO2, Arterial: 39.3 mmHg — CL (ref 83.0–108.0)

## 2016-02-22 LAB — GLUCOSE, CAPILLARY: Glucose-Capillary: 283 mg/dL — ABNORMAL HIGH (ref 65–99)

## 2016-02-22 LAB — LACTIC ACID, PLASMA
LACTIC ACID, VENOUS: 1.6 mmol/L (ref 0.5–1.9)
Lactic Acid, Venous: 1 mmol/L (ref 0.5–1.9)

## 2016-02-22 LAB — TROPONIN I

## 2016-02-22 LAB — BRAIN NATRIURETIC PEPTIDE: B Natriuretic Peptide: 100 pg/mL (ref 0.0–100.0)

## 2016-02-22 MED ORDER — MOMETASONE FURO-FORMOTEROL FUM 200-5 MCG/ACT IN AERO
INHALATION_SPRAY | RESPIRATORY_TRACT | Status: AC
  Filled 2016-02-22: qty 8.8

## 2016-02-22 MED ORDER — ALBUTEROL SULFATE (2.5 MG/3ML) 0.083% IN NEBU
2.5000 mg | INHALATION_SOLUTION | Freq: Four times a day (QID) | RESPIRATORY_TRACT | Status: DC
  Administered 2016-02-22 – 2016-02-27 (×19): 2.5 mg via RESPIRATORY_TRACT
  Filled 2016-02-22 (×18): qty 3

## 2016-02-22 MED ORDER — ORAL CARE MOUTH RINSE
15.0000 mL | Freq: Two times a day (BID) | OROMUCOSAL | Status: DC
  Administered 2016-02-24 – 2016-02-25 (×3): 15 mL via OROMUCOSAL

## 2016-02-22 MED ORDER — LINAGLIPTIN 5 MG PO TABS
5.0000 mg | ORAL_TABLET | Freq: Every day | ORAL | Status: DC
  Administered 2016-02-23 – 2016-02-27 (×5): 5 mg via ORAL
  Filled 2016-02-22 (×5): qty 1

## 2016-02-22 MED ORDER — DEXTROSE 5 % IV SOLN
500.0000 mg | INTRAVENOUS | Status: DC
  Administered 2016-02-22 – 2016-02-26 (×4): 500 mg via INTRAVENOUS
  Filled 2016-02-22 (×7): qty 500

## 2016-02-22 MED ORDER — METFORMIN HCL 500 MG PO TABS
1000.0000 mg | ORAL_TABLET | Freq: Every day | ORAL | Status: DC
  Administered 2016-02-23 – 2016-02-27 (×5): 1000 mg via ORAL
  Filled 2016-02-22 (×5): qty 2

## 2016-02-22 MED ORDER — ALBUTEROL SULFATE (2.5 MG/3ML) 0.083% IN NEBU
2.5000 mg | INHALATION_SOLUTION | Freq: Four times a day (QID) | RESPIRATORY_TRACT | Status: DC | PRN
  Administered 2016-02-23: 2.5 mg via RESPIRATORY_TRACT
  Filled 2016-02-22: qty 3

## 2016-02-22 MED ORDER — DILTIAZEM HCL ER COATED BEADS 180 MG PO CP24
180.0000 mg | ORAL_CAPSULE | Freq: Every day | ORAL | Status: DC
  Administered 2016-02-23: 180 mg via ORAL
  Filled 2016-02-22: qty 1

## 2016-02-22 MED ORDER — MOMETASONE FURO-FORMOTEROL FUM 200-5 MCG/ACT IN AERO
2.0000 | INHALATION_SPRAY | Freq: Two times a day (BID) | RESPIRATORY_TRACT | Status: DC
  Administered 2016-02-24 – 2016-02-27 (×7): 2 via RESPIRATORY_TRACT
  Filled 2016-02-22: qty 8.8

## 2016-02-22 MED ORDER — DEXTROSE 5 % IV SOLN
1.0000 g | INTRAVENOUS | Status: DC
  Administered 2016-02-22 – 2016-02-26 (×5): 1 g via INTRAVENOUS
  Filled 2016-02-22 (×7): qty 10

## 2016-02-22 MED ORDER — INSULIN DETEMIR 100 UNIT/ML ~~LOC~~ SOLN
40.0000 [IU] | Freq: Every day | SUBCUTANEOUS | Status: DC
  Administered 2016-02-22 – 2016-02-23 (×2): 40 [IU] via SUBCUTANEOUS
  Filled 2016-02-22 (×2): qty 0.4

## 2016-02-22 MED ORDER — ACETAMINOPHEN 650 MG RE SUPP: 650.0000 mg | Freq: Four times a day (QID) | RECTAL | Status: DC | PRN

## 2016-02-22 MED ORDER — METHYLPREDNISOLONE SODIUM SUCC 125 MG IJ SOLR
80.0000 mg | Freq: Three times a day (TID) | INTRAMUSCULAR | Status: DC
  Administered 2016-02-22 – 2016-02-25 (×8): 80 mg via INTRAVENOUS
  Filled 2016-02-22 (×8): qty 2

## 2016-02-22 MED ORDER — ACETAMINOPHEN 325 MG PO TABS: 650.0000 mg | ORAL_TABLET | Freq: Four times a day (QID) | ORAL | Status: DC | PRN

## 2016-02-22 MED ORDER — TRAMADOL HCL 50 MG PO TABS
50.0000 mg | ORAL_TABLET | Freq: Every day | ORAL | Status: DC
  Administered 2016-02-25 – 2016-02-26 (×2): 50 mg via ORAL
  Filled 2016-02-22 (×2): qty 1

## 2016-02-22 MED ORDER — ENOXAPARIN SODIUM 100 MG/ML ~~LOC~~ SOLN
100.0000 mg | SUBCUTANEOUS | Status: DC
  Administered 2016-02-22 – 2016-02-26 (×5): 100 mg via SUBCUTANEOUS
  Filled 2016-02-22 (×5): qty 1

## 2016-02-22 MED ORDER — DEXTROSE 5 % IV SOLN
INTRAVENOUS | Status: AC
  Filled 2016-02-22: qty 500

## 2016-02-22 MED ORDER — DEXTROSE 5 % IV SOLN
INTRAVENOUS | Status: AC
  Filled 2016-02-22: qty 10

## 2016-02-22 MED ORDER — ASPIRIN EC 81 MG PO TBEC
81.0000 mg | DELAYED_RELEASE_TABLET | Freq: Every day | ORAL | Status: DC
  Administered 2016-02-23 – 2016-02-27 (×5): 81 mg via ORAL
  Filled 2016-02-22 (×5): qty 1

## 2016-02-22 MED ORDER — SODIUM CHLORIDE 0.9% FLUSH
3.0000 mL | Freq: Two times a day (BID) | INTRAVENOUS | Status: DC
  Administered 2016-02-22 – 2016-02-26 (×7): 3 mL via INTRAVENOUS

## 2016-02-22 MED ORDER — TIOTROPIUM BROMIDE MONOHYDRATE 18 MCG IN CAPS
18.0000 ug | ORAL_CAPSULE | Freq: Every day | RESPIRATORY_TRACT | Status: DC
  Administered 2016-02-24 – 2016-02-27 (×4): 18 ug via RESPIRATORY_TRACT
  Filled 2016-02-22: qty 5

## 2016-02-22 MED ORDER — ALBUTEROL (5 MG/ML) CONTINUOUS INHALATION SOLN
10.0000 mg/h | INHALATION_SOLUTION | Freq: Once | RESPIRATORY_TRACT | Status: AC
  Administered 2016-02-22: 10 mg/h via RESPIRATORY_TRACT

## 2016-02-22 MED ORDER — PIOGLITAZONE HCL 15 MG PO TABS
30.0000 mg | ORAL_TABLET | Freq: Every day | ORAL | Status: DC
  Administered 2016-02-24 – 2016-02-27 (×4): 30 mg via ORAL
  Filled 2016-02-22: qty 1
  Filled 2016-02-22 (×2): qty 2
  Filled 2016-02-22: qty 1
  Filled 2016-02-22: qty 2
  Filled 2016-02-22 (×2): qty 1
  Filled 2016-02-22: qty 2

## 2016-02-22 MED ORDER — PRAVASTATIN SODIUM 40 MG PO TABS
40.0000 mg | ORAL_TABLET | Freq: Every day | ORAL | Status: DC
  Administered 2016-02-23 – 2016-02-26 (×4): 40 mg via ORAL
  Filled 2016-02-22 (×4): qty 1

## 2016-02-22 MED ORDER — SITAGLIP PHOS-METFORMIN HCL ER 100-1000 MG PO TB24: 1.0000 | ORAL_TABLET | ORAL | Status: DC

## 2016-02-22 MED ORDER — INSULIN ASPART 100 UNIT/ML ~~LOC~~ SOLN
0.0000 [IU] | Freq: Three times a day (TID) | SUBCUTANEOUS | Status: DC
  Administered 2016-02-23: 3 [IU] via SUBCUTANEOUS
  Administered 2016-02-23: 5 [IU] via SUBCUTANEOUS
  Administered 2016-02-24 (×3): 9 [IU] via SUBCUTANEOUS
  Administered 2016-02-25 – 2016-02-26 (×6): 7 [IU] via SUBCUTANEOUS
  Administered 2016-02-27: 2 [IU] via SUBCUTANEOUS
  Administered 2016-02-27: 5 [IU] via SUBCUTANEOUS

## 2016-02-22 MED ORDER — CHLORHEXIDINE GLUCONATE 0.12 % MT SOLN
15.0000 mL | Freq: Two times a day (BID) | OROMUCOSAL | Status: DC
  Administered 2016-02-22 – 2016-02-26 (×9): 15 mL via OROMUCOSAL
  Filled 2016-02-22 (×9): qty 15

## 2016-02-22 MED ORDER — SODIUM CHLORIDE 0.9% FLUSH
3.0000 mL | INTRAVENOUS | Status: DC | PRN
Start: 2016-02-22 — End: 2016-02-27
  Administered 2016-02-25: 3 mL via INTRAVENOUS
  Filled 2016-02-22: qty 3

## 2016-02-22 MED ORDER — INSULIN ASPART 100 UNIT/ML ~~LOC~~ SOLN
0.0000 [IU] | Freq: Every day | SUBCUTANEOUS | Status: DC
  Administered 2016-02-22: 3 [IU] via SUBCUTANEOUS
  Administered 2016-02-23 – 2016-02-24 (×2): 4 [IU] via SUBCUTANEOUS
  Administered 2016-02-25 – 2016-02-26 (×2): 5 [IU] via SUBCUTANEOUS

## 2016-02-22 MED ORDER — SODIUM CHLORIDE 0.9 % IV SOLN: 250.0000 mL | INTRAVENOUS | Status: DC | PRN

## 2016-02-22 MED ORDER — METFORMIN HCL 500 MG PO TABS
1000.0000 mg | ORAL_TABLET | Freq: Every day | ORAL | Status: DC
  Administered 2016-02-23 – 2016-02-26 (×4): 1000 mg via ORAL
  Filled 2016-02-22 (×4): qty 2

## 2016-02-22 NOTE — ED Notes (Signed)
Pt states that he does not need to urinate, and refuses to I&O.

## 2016-02-22 NOTE — ED Notes (Signed)
Pt states unable to urinate at this time. 

## 2016-02-22 NOTE — H&P (Signed)
TRH H&P   Patient Demographics:    Brett Curry, is a 56 y.o. male  MRN: [id number]   DOB - Aug 27, 1960  Admit Date - 02/22/2016  Outpatient Primary MD for the patient is Carylon PerchesFAGAN,ROY, MD  Referring MD/NP/PA:   Milagros EvenerJulia Haviland  Outpatient Specialists:   Patient coming from:  home  Chief Complaint  Patient presents with  . Shortness of Breath      HPI:    Brett Curry  is a 56 y.o. male, w Copd who presents w 2 days of increasing sob. Slight dry cough to cough w white sputum.  Pt admits to smoking cigarettes , and also to not wearing his cpap at nite for the past 2 weeks.   Denies fever, chillls, cp, palp, n/v, diarrhea.     Review of systems:    In addition to the HPI above,  No Fever-chills, No Headache, No changes with Vision or hearing, No problems swallowing food or Liquids, No Chest pain, No Abdominal pain, No Nausea or Vommitting, Bowel movements are regular, No Blood in stool or Urine, No dysuria, No new skin rashes or bruises, No new joints pains-aches,  No new weakness, tingling, numbness in any extremity, No recent weight gain or loss, No polyuria, polydypsia or polyphagia, No significant Mental Stressors.  A full 10 point Review of Systems was done, except as stated above, all other Review of Systems were negative.   With Past History of the following :    Past Medical History:  Diagnosis Date  . Asthma   . Atrial fibrillation (HCC)   . COPD (chronic obstructive pulmonary disease) (HCC)   . Hyperlipidemia   . Morbid obesity due to excess calories (HCC) 07/27/2014  . Type II or unspecified type diabetes mellitus without mention of complication, not stated as uncontrolled       Past Surgical History:  Procedure Laterality Date  . CARDIAC CATHETERIZATION N/A 07/09/2015   Procedure: Left Heart Cath and Coronary Angiography;  Surgeon: Marykay Lexavid W Harding,  MD;  [facility];  Service: Cardiovascular;  Laterality: N/A;  . EYE SURGERY    . KNEE SURGERY     right  . SHOULDER SURGERY     right  . TONSILLECTOMY        Social History:     Social History  Substance Use Topics  . Smoking status: Former Smoker    Packs/day: 2.00    Years: 34.00    Types: Cigarettes    Quit date: 03/28/2015  . Smokeless tobacco: Never Used     Comment: started at age 56.   Marland Kitchen. Alcohol use No     Lives - at home  Mobility -  Able to ambulate   Family History :     Family History  Problem Relation Age of Onset  . Asthma Sister   . Heart attack Mother  Home Medications:   Prior to Admission medications   Medication Sig Start Date End Date Taking? Authorizing Provider  diltiazem (CARDIZEM CD) 180 MG 24 hr capsule Take 180 mg by mouth daily.  06/30/15  Yes Historical Provider, MD  Fluticasone-Salmeterol (ADVAIR) 250-50 MCG/DOSE AEPB Inhale 1 puff into the lungs 2 (two) times daily. 12/15/15  Yes Coralyn Helling, MD  glimepiride (AMARYL) 4 MG tablet Take 4 mg by mouth daily before breakfast.   Yes Historical Provider, MD  insulin detemir (LEVEMIR) 100 UNIT/ML injection Inject 0.4 mLs (40 Units total) into the skin at bedtime. 04/10/15  Yes Carylon Perches, MD  ipratropium-albuterol (DUONEB) 0.5-2.5 (3) MG/3ML SOLN Take 3 mLs by nebulization 2 (two) times daily as needed (for wheezing or shortness of breath).    Yes Historical Provider, MD  metFORMIN (GLUCOPHAGE) 1000 MG tablet Take 1,000 mg by mouth at bedtime.    Yes Historical Provider, MD  Omega-3 Fatty Acids (FISH OIL) 1200 MG CAPS Take 1,200 mg by mouth 2 (two) times daily.   Yes Historical Provider, MD  pioglitazone (ACTOS) 30 MG tablet Take 30 mg by mouth daily.  06/22/15  Yes Historical Provider, MD  pravastatin (PRAVACHOL) 40 MG tablet 40 mg at bedtime. 06/30/15  Yes Historical Provider, MD  SitaGLIPtin-MetFORMIN HCl [phone] MG TB24 Take 1 tablet by mouth every morning.   Yes  Historical Provider, MD  traMADol (ULTRAM) 50 MG tablet Take 50 mg by mouth at bedtime. 04/14/15  Yes Historical Provider, MD  OXYGEN Inhale 5 L into the lungs.    Historical Provider, MD     Allergies:    No Known Allergies   Physical Exam:   Vitals  Blood pressure 133/76, pulse 100, temperature 98.3 F (36.8 C), resp. rate 17, height 6' (1.829 m), weight (!) 199.6 kg (440 lb), SpO2 97 %.   1. General lying in bed in NAD,    2. Normal affect and insight, Not Suicidal or Homicidal, Awake Alert, Oriented X 3.  3. No F.N deficits, ALL C.Nerves Intact, Strength 5/5 all 4 extremities, Sensation intact all 4 extremities, Plantars down going.  4. Ears and Eyes appear Normal, Conjunctivae clear, PERRLA. Moist Oral Mucosa.  5. Supple Neck, No JVD, No cervical lymphadenopathy appriciated, No Carotid Bruits.  6. Symmetrical Chest wall movement, Good air movement bilaterally, bilateral exp wheezing.   7. RRR, No Gallops, Rubs or Murmurs, No Parasternal Heave.  8.  Morbid obeisity, Positive Bowel Sounds, Abdomen Soft, No tenderness, No organomegaly appriciated,No rebound -guarding or rigidity.  9.  No Cyanosis, Normal Skin Turgor, No Skin Rash or Bruise.  10. Good muscle tone,  joints appear normal , no effusions, Normal ROM.  11. No Palpable Lymph Nodes in Neck or Axillae     Data Review:    CBC  Recent Labs Lab 02/22/16 1035  WBC 10.5  HGB 13.9  HCT 46.5  PLT 150  MCV 98.5  MCH 29.4  MCHC 29.9*  RDW 16.2*  LYMPHSABS 0.7  MONOABS 0.4  EOSABS 0.0  BASOSABS 0.0   ------------------------------------------------------------------------------------------------------------------  Chemistries   Recent Labs Lab 02/22/16 1035  NA 136  K 5.4*  CL 89*  CO2 40*  GLUCOSE 281*  BUN 16  CREATININE 0.57*  CALCIUM 9.1  AST 15  ALT 18  ALKPHOS 59  BILITOT 0.5    ------------------------------------------------------------------------------------------------------------------ estimated creatinine clearance is 184.3 mL/min (by C-G formula based on SCr of 0.57 mg/dL (L)). ------------------------------------------------------------------------------------------------------------------ No results for input(s): TSH, T4TOTAL, T3FREE, THYROIDAB in the  last 72 hours.  Invalid input(s): FREET3  Coagulation profile No results for input(s): INR, PROTIME in the last 168 hours. ------------------------------------------------------------------------------------------------------------------- No results for input(s): DDIMER in the last 72 hours. -------------------------------------------------------------------------------------------------------------------  Cardiac Enzymes  Recent Labs Lab 02/22/16 1035  TROPONINI <0.03   ------------------------------------------------------------------------------------------------------------------    Component Value Date/Time   BNP 100.0 02/22/2016 1035     ---------------------------------------------------------------------------------------------------------------  Urinalysis    Component Value Date/Time   COLORURINE YELLOW 04/04/2015 0600   APPEARANCEUR HAZY (A) 04/04/2015 0600   LABSPEC 1.025 04/04/2015 0600   PHURINE 5.5 04/04/2015 0600   GLUCOSEU 100 (A) 04/04/2015 0600   HGBUR SMALL (A) 04/04/2015 0600   BILIRUBINUR NEGATIVE 04/04/2015 0600   KETONESUR NEGATIVE 04/04/2015 0600   PROTEINUR NEGATIVE 04/04/2015 0600   NITRITE NEGATIVE 04/04/2015 0600   LEUKOCYTESUR NEGATIVE 04/04/2015 0600    ----------------------------------------------------------------------------------------------------------------   Imaging Results:    Dg Chest Port 1 View  Result Date: 02/22/2016 CLINICAL DATA:  Worsening shortness of breath for 3 days. EXAM: PORTABLE CHEST 1 VIEW COMPARISON:  04/04/2015 FINDINGS:  Portable semi upright view of the chest was obtained. Limited evaluation of the right lower chest but cannot exclude densities at the right lung base including pleural fluid. Overall, the interstitial lung markings are prominent and suggestive for edema. Heart size is enlarged. IMPRESSION: Prominent lung markings may represent pulmonary edema. Limited evaluation of the right lower chest but cannot exclude right basilar disease including right pleural fluid. Recommend repeat imaging with two views if possible. Electronically Signed   By: Richarda Overlie M.D.   On: 02/22/2016 10:03       Assessment & Plan:    Active Problems:   COPD exacerbation (HCC)    1. Copd exacerbation Solumedrol 80mg  iv q8h [name] 500mg  iv qday Rocephin 1gm iv qday spiriva 1puff qday albuerol neb 1 neb po qid and q6h prn Repeat CXR 2 V  2. Dm2 fsbs ac and qhs, iss  3. Hyperkalemia mild Repeat bmp  4. Hyperlipidemia Cont pravastatin  5. ? Afib Chads2vasc=2 Unclear why not on anticoagulation, will start on aspirin 81mg  po qday    DVT Prophylaxis  Lovenox - SCDs  AM Labs Ordered, also please review Full Orders  Family Communication: Admission, patients condition and plan of care including tests being ordered have been discussed with the patient  who indicate understanding and agree with the plan and Code Status.  Code Status FULL CODE  Likely DC to  home  Condition GUARDED    Consults called:   Admission status: inpatient  Time spent in minutes : 45 minutes   Pearson Grippe M.D on 02/22/2016 at 12:01 PM  Between 7am to 7pm - Pager - [phone] After 7pm go to [email or web address] - password [facility]  [facility] - Office  [phone]

## 2016-02-22 NOTE — ED Triage Notes (Signed)
Pt c/o SOB since Friday. Pt received Duo Neb and 125 Solumedrol IV in route by EMS. Pt states he wears O2 at 5L continuous at home.

## 2016-02-22 NOTE — ED Notes (Addendum)
Patient pulled tubes off of machine stating he wanted to see if anyone was here. It was explained to patient why he had to keep machine on, and why he could not have anything to eat yet.

## 2016-02-22 NOTE — ED Provider Notes (Signed)
[facility]-EMERGENCY DEPT Provider Note   CSN: [id number] Arrival date & time: 02/22/16  [id number]  By signing my name below, I, Marnette Burgessyan Andrew Long, attest that this documentation has been prepared under the direction and in the presence of Jacalyn LefevreJulie Whittaker Lenis, MD . Electronically Signed: Marnette Burgessyan Andrew Long, Scribe. 02/22/2016. 10:28 AM.   History   Chief Complaint Chief Complaint  Patient presents with  . Shortness of Breath    The history is provided by the patient and medical records. No language interpreter was used.    HPI Comments:  Narda BondsRandy P Estabrooks is a morbidly obese 56 y.o. male with a PMHx of Asthma, COPD, HLD, and Afib, who presents to the Emergency Department by way of EMS complaining of gradually worsening SOB onset three days ago.  Pt did not use his CPAP last night, and his symptoms worsened. Pt notes he is on 5L of continuous oxygen at home. He states no associated abdominal pain today but had some pain yesterday. Per chart review, pt received Duo Nebulizer and 125mg  IV Solumedrol in route by EMS. He also denies fever and any other associated symptoms at this time. Pt reports he recently quit smoking.   Pt also has a hx of a.fib (chadvasc 1).  I don't see any blood thinners on his med list.    Past Medical History:  Diagnosis Date  . Asthma   . Atrial fibrillation (HCC)   . COPD (chronic obstructive pulmonary disease) (HCC)   . Hyperlipidemia   . Morbid obesity due to excess calories (HCC) 07/27/2014  . Type II or unspecified type diabetes mellitus without mention of complication, not stated as uncontrolled     Patient Active Problem List   Diagnosis Date Noted  . DOE (dyspnea on exertion)   . Paroxysmal atrial fibrillation (HCC) 07/08/2015  . Abnormal nuclear stress test 07/08/2015  . Chronic respiratory failure (HCC) 06/16/2015  . COPD (chronic obstructive pulmonary disease) (HCC) 05/12/2015  . Obesity hypoventilation syndrome (HCC) 05/12/2015  . Morbid obesity (HCC) 05/12/2015    . Pre-operative clearance 05/12/2015  . COPD with acute exacerbation (HCC) 04/04/2015  . Hyperkalemia 04/04/2015  . Morbid obesity due to excess calories (HCC) 07/27/2014  . Acute respiratory failure with hypoxia (HCC) 09/19/2012  . COPD exacerbation (HCC) 09/19/2012  . Obstructive sleep apnea 09/11/2009  . Diabetes (HCC) 09/10/2009  . HYPERLIPIDEMIA 09/10/2009    Past Surgical History:  Procedure Laterality Date  . CARDIAC CATHETERIZATION N/A 07/09/2015   Procedure: Left Heart Cath and Coronary Angiography;  Surgeon: Marykay Lexavid W Harding, MD;  [facility];  Service: Cardiovascular;  Laterality: N/A;  . EYE SURGERY    . KNEE SURGERY     right  . SHOULDER SURGERY     right  . TONSILLECTOMY         Home Medications    Prior to Admission medications   Medication Sig Start Date End Date Taking? Authorizing Provider  diltiazem (CARDIZEM CD) 180 MG 24 hr capsule Take 180 mg by mouth daily.  06/30/15  Yes Historical Provider, MD  Fluticasone-Salmeterol (ADVAIR) 250-50 MCG/DOSE AEPB Inhale 1 puff into the lungs 2 (two) times daily. 12/15/15  Yes Coralyn HellingVineet Sood, MD  glimepiride (AMARYL) 4 MG tablet Take 4 mg by mouth daily before breakfast.   Yes Historical Provider, MD  insulin detemir (LEVEMIR) 100 UNIT/ML injection Inject 0.4 mLs (40 Units total) into the skin at bedtime. 04/10/15  Yes Carylon Perchesoy Fagan, MD  ipratropium-albuterol (DUONEB) 0.5-2.5 (3) MG/3ML SOLN Take 3  mLs by nebulization 2 (two) times daily as needed (for wheezing or shortness of breath).    Yes Historical Provider, MD  metFORMIN (GLUCOPHAGE) 1000 MG tablet Take 1,000 mg by mouth at bedtime.    Yes Historical Provider, MD  Omega-3 Fatty Acids (FISH OIL) 1200 MG CAPS Take 1,200 mg by mouth 2 (two) times daily.   Yes Historical Provider, MD  pioglitazone (ACTOS) 30 MG tablet Take 30 mg by mouth daily.  06/22/15  Yes Historical Provider, MD  pravastatin (PRAVACHOL) 40 MG tablet 40 mg at bedtime. 06/30/15  Yes Historical  Provider, MD  SitaGLIPtin-MetFORMIN HCl [phone] MG TB24 Take 1 tablet by mouth every morning.   Yes Historical Provider, MD  traMADol (ULTRAM) 50 MG tablet Take 50 mg by mouth at bedtime. 04/14/15  Yes Historical Provider, MD  OXYGEN Inhale 5 L into the lungs.    Historical Provider, MD    Family History Family History  Problem Relation Age of Onset  . Asthma Sister   . Heart attack Mother     Social History Social History  Substance Use Topics  . Smoking status: Former Smoker    Packs/day: 2.00    Years: 34.00    Types: Cigarettes    Quit date: 03/28/2015  . Smokeless tobacco: Never Used     Comment: started at age 65.   Marland Kitchen Alcohol use No     Allergies   Patient has no known allergies.   Review of Systems Review of Systems 10 systems reviewed and all are negative for acute change except as noted in the HPI.    Physical Exam Updated Vital Signs BP 133/76   Pulse 100   Temp 98.3 F (36.8 C)   Resp 17   Ht 6' (1.829 m)   Wt (!) 440 lb (199.6 kg)   SpO2 97%   BMI 59.67 kg/m   Physical Exam  Constitutional: He appears well-developed and well-nourished. He appears distressed.  HENT:  Head: Normocephalic and atraumatic.  Right Ear: External ear normal.  Left Ear: External ear normal.  Nose: Nose normal.  Eyes: Right eye exhibits no discharge. Left eye exhibits no discharge.  Neck: Neck supple.  Cardiovascular: Regular rhythm and normal heart sounds.  Tachycardia present.   Pulmonary/Chest: Bradypnea noted. He has wheezes.  Abdominal: Soft. There is no tenderness.  Pt is obese  Musculoskeletal: He exhibits edema.  1+ edema bilateral lower extremities.   Neurological: He is alert.  somnolent  Skin: Skin is warm and dry.  Chronic skin changes bilateral lower extremities.   Nursing note and vitals reviewed.    ED Treatments / Results  DIAGNOSTIC STUDIES:  Oxygen Saturation is 89% on [place] at 6L, low by my interpretation.    COORDINATION OF CARE:  9:36  AM Discussed treatment plan with pt at bedside including ABG and pt agreed to plan.  Labs (all labs ordered are listed, but only abnormal results are displayed) Labs Reviewed  COMPREHENSIVE METABOLIC PANEL - Abnormal; Notable for the following:       Result Value   Potassium 5.4 (*)    Chloride 89 (*)    CO2 40 (*)    Glucose, Bld 281 (*)    Creatinine, Ser 0.57 (*)    Albumin 3.4 (*)    All other components within normal limits  CBC WITH DIFFERENTIAL/PLATELET - Abnormal; Notable for the following:    MCHC 29.9 (*)    RDW 16.2 (*)    Neutro Abs 9.4 (*)  All other components within normal limits  BLOOD GAS, ARTERIAL - Abnormal; Notable for the following:    pH, Arterial 7.241 (*)    pCO2 arterial 101 (*)    pO2, Arterial 39.3 (*)    Bicarbonate 33.1 (*)    Acid-Base Excess 14.0 (*)    All other components within normal limits  CULTURE, BLOOD (ROUTINE X 2)  CULTURE, BLOOD (ROUTINE X 2)  TROPONIN I  BRAIN NATRIURETIC PEPTIDE  LACTIC ACID, PLASMA  URINALYSIS, ROUTINE W REFLEX MICROSCOPIC  LACTIC ACID, PLASMA    EKG  EKG Interpretation  Date/Time:  Monday February 22 2016 09:35:46 EST Ventricular Rate:  105 PR Interval:    QRS Duration: 95 QT Interval:  313 QTC Calculation: 414 R Axis:   83 Text Interpretation:  Atrial fibrillation Ventricular premature complex Low voltage, extremity and precordial leads Probable anteroseptal infarct, old Borderline repolarization abnormality Minimal ST elevation, inferior leads No significant change since last tracing Confirmed by [facility] MD, Zaleah Ternes ([id number]) on 02/22/2016 10:09:22 AM       Radiology Dg Chest Port 1 View  Result Date: 02/22/2016 CLINICAL DATA:  Worsening shortness of breath for 3 days. EXAM: PORTABLE CHEST 1 VIEW COMPARISON:  04/04/2015 FINDINGS: Portable semi upright view of the chest was obtained. Limited evaluation of the right lower chest but cannot exclude densities at the right lung base including pleural fluid.  Overall, the interstitial lung markings are prominent and suggestive for edema. Heart size is enlarged. IMPRESSION: Prominent lung markings may represent pulmonary edema. Limited evaluation of the right lower chest but cannot exclude right basilar disease including right pleural fluid. Recommend repeat imaging with two views if possible. Electronically Signed   By: Richarda Overlie M.D.   On: 02/22/2016 10:03    Procedures Procedures (including critical care time)  Medications Ordered in ED Medications  albuterol (PROVENTIL,VENTOLIN) solution continuous neb (10 mg/hr Nebulization Given 02/22/16 1133)     Initial Impression / Assessment and Plan / ED Course  I have reviewed the triage vital signs and the nursing notes.  Pertinent labs & imaging results that were available during my care of the patient were reviewed by me and considered in my medical decision making (see chart for details).    CRITICAL CARE Performed by: Jacalyn Lefevre   Total critical care time: 30 minutes  Critical care time was exclusive of separately billable procedures and treating other patients.  Critical care was necessary to treat or prevent imminent or life-threatening deterioration.  Critical care was time spent personally by me on the following activities: development of treatment plan with patient and/or surrogate as well as nursing, discussions with consultants, evaluation of patient's response to treatment, examination of patient, obtaining history from patient or surrogate, ordering and performing treatments and interventions, ordering and review of laboratory studies, ordering and review of radiographic studies, pulse oximetry and re-evaluation of patient's condition.  Pt's mental status improved significantly on bipap.  He is awake and alert and is actively watching TV in his room.  Pt is much more responsive to questions.  Pt d/w Dr. Selena Batten ([facility]) who will admit pt to the hospital.   Final Clinical  Impressions(s) / ED Diagnoses   Final diagnoses:  Acute on chronic respiratory failure with hypoxia and hypercapnia (HCC)  Poorly controlled type 2 diabetes mellitus (HCC)  Morbid obesity (HCC)  Obesity hypoventilation syndrome (HCC)  Chronic atrial fibrillation (HCC)    New Prescriptions New Prescriptions   No medications on file   I personally  performed the services described in this documentation, which was scribed in my presence. The recorded information has been reviewed and is accurate.   Jacalyn Lefevre, MD 02/22/16 1155

## 2016-02-22 NOTE — Progress Notes (Signed)
Pt refusing to wear leads and BP cuff.

## 2016-02-23 ENCOUNTER — Inpatient Hospital Stay (HOSPITAL_COMMUNITY): Payer: Medicare Other

## 2016-02-23 LAB — MRSA PCR SCREENING: MRSA by PCR: NEGATIVE

## 2016-02-23 LAB — GLUCOSE, CAPILLARY
GLUCOSE-CAPILLARY: 273 mg/dL — AB (ref 65–99)
GLUCOSE-CAPILLARY: 243 mg/dL — AB (ref 65–99)
Glucose-Capillary: 344 mg/dL — ABNORMAL HIGH (ref 65–99)

## 2016-02-23 LAB — BLOOD GAS, ARTERIAL
Acid-Base Excess: 16.4 mmol/L — ABNORMAL HIGH (ref 0.0–2.0)
Acid-Base Excess: 16.7 mmol/L — ABNORMAL HIGH (ref 0.0–2.0)
BICARBONATE: 37 mmol/L — AB (ref 20.0–28.0)
Bicarbonate: 37.1 mmol/L — ABNORMAL HIGH (ref 20.0–28.0)
DELIVERY SYSTEMS: POSITIVE
DELIVERY SYSTEMS: POSITIVE
DRAWN BY: 21310
Drawn by: 234301
EXPIRATORY PAP: 6
EXPIRATORY PAP: 8
FIO2: 45
FIO2: 60
INSPIRATORY PAP: 20
Inspiratory PAP: 24
Mode: POSITIVE
O2 SAT: 82.2 %
O2 SAT: 93.3 %
PCO2 ART: 77.7 mmHg — AB (ref 32.0–48.0)
PH ART: 7.358 (ref 7.350–7.450)
PH ART: 7.302 — AB (ref 7.350–7.450)
PO2 ART: 50.7 mmHg — AB (ref 83.0–108.0)
PO2 ART: 78.2 mmHg — AB (ref 83.0–108.0)
Patient temperature: 37
pCO2 arterial: 91.3 mmHg (ref 32.0–48.0)

## 2016-02-23 LAB — COMPREHENSIVE METABOLIC PANEL
ALBUMIN: 3.2 g/dL — AB (ref 3.5–5.0)
ALK PHOS: 58 U/L (ref 38–126)
ALT: 17 U/L (ref 17–63)
AST: 13 U/L — AB (ref 15–41)
Anion gap: 7 (ref 5–15)
BILIRUBIN TOTAL: 0.6 mg/dL (ref 0.3–1.2)
BUN: 17 mg/dL (ref 6–20)
CALCIUM: 9.1 mg/dL (ref 8.9–10.3)
CO2: 42 mmol/L — ABNORMAL HIGH (ref 22–32)
CREATININE: 0.51 mg/dL — AB (ref 0.61–1.24)
Chloride: 86 mmol/L — ABNORMAL LOW (ref 101–111)
GFR calc Af Amer: >60 mL/min (ref >60)
GLUCOSE: 282 mg/dL — AB (ref 65–99)
POTASSIUM: 4.7 mmol/L (ref 3.5–5.1)
Sodium: 135 mmol/L (ref 135–145)
TOTAL PROTEIN: 6.6 g/dL (ref 6.5–8.1)

## 2016-02-23 LAB — CBC
HEMATOCRIT: 44.2 % (ref 39.0–52.0)
Hemoglobin: 13.3 g/dL (ref 13.0–17.0)
MCH: 29 pg (ref 26.0–34.0)
MCHC: 30.1 g/dL (ref 30.0–36.0)
MCV: 96.5 fL (ref 78.0–100.0)
PLATELETS: 187 10*3/uL (ref 150–400)
RBC: 4.58 MIL/uL (ref 4.22–5.81)
RDW: 16.4 % — AB (ref 11.5–15.5)
WBC: 11.3 10*3/uL — AB (ref 4.0–10.5)

## 2016-02-23 LAB — HEMOGLOBIN A1C
HEMOGLOBIN A1C: 11.9 % — AB (ref 4.8–5.6)
Mean Plasma Glucose: 295 mg/dL

## 2016-02-23 MED ORDER — DILTIAZEM HCL-DEXTROSE 100-5 MG/100ML-% IV SOLN (PREMIX)
5.0000 mg/h | INTRAVENOUS | Status: DC
  Administered 2016-02-23 – 2016-02-25 (×3): 5 mg/h via INTRAVENOUS
  Filled 2016-02-23 (×4): qty 100

## 2016-02-23 NOTE — Progress Notes (Signed)
Patient HR increased to range between 130s -150s. MD called and made aware. Orders put in and completed, Cardizem drip started.

## 2016-02-23 NOTE — Progress Notes (Signed)
Subjective: He remains on BiPAP. He states he feels better. He has no fever.  Objective: Vital signs in last 24 hours: Vitals:   02/23/16 0500 02/23/16 0519 02/23/16 0600 02/23/16 0609  BP: (!) 136/104  134/81   Pulse: (!) 109 (!) 111 (!) 108 (!) 106  Resp:   17 (!) 27  Temp:      TempSrc:      SpO2: (!) 88% (!) 89% (!) 84% 93%  Weight: (!) 444 lb 10.7 oz (201.7 kg)     Height:       Weight change:   Intake/Output Summary (Last 24 hours) at 02/23/16 0737 Last data filed at 02/23/16 0000  Gross per 24 hour  Intake              300 ml  Output              925 ml  Net             -625 ml    Physical Exam: Alert and coherent after being awakened. Lungs reveal bilateral wheezes. Heart tachycardic and irregular. Abdomen obese and nontender. Remedies reveal chronic venous stasis changes in the legs.  Lab Results:    Results for orders placed or performed during the hospital encounter of 02/22/16 (from the past 24 hour(s))  Comprehensive metabolic panel     Status: Abnormal   Collection Time: 02/22/16 10:35 AM  Result Value Ref Range   Sodium 136 135 - 145 mmol/L   Potassium 5.4 (H) 3.5 - 5.1 mmol/L   Chloride 89 (L) 101 - 111 mmol/L   CO2 40 (H) 22 - 32 mmol/L   Glucose, Bld 281 (H) 65 - 99 mg/dL   BUN 16 6 - 20 mg/dL   Creatinine, Ser 0.45 (L) 0.61 - 1.24 mg/dL   Calcium 9.1 8.9 - 40.9 mg/dL   Total Protein 7.3 6.5 - 8.1 g/dL   Albumin 3.4 (L) 3.5 - 5.0 g/dL   AST 15 15 - 41 U/L   ALT 18 17 - 63 U/L   Alkaline Phosphatase 59 38 - 126 U/L   Total Bilirubin 0.5 0.3 - 1.2 mg/dL   GFR calc non Af Amer >60 >60 mL/min   GFR calc Af Amer >60 >60 mL/min   Anion gap 7 5 - 15  CBC WITH DIFFERENTIAL     Status: Abnormal   Collection Time: 02/22/16 10:35 AM  Result Value Ref Range   WBC 10.5 4.0 - 10.5 K/uL   RBC 4.72 4.22 - 5.81 MIL/uL   Hemoglobin 13.9 13.0 - 17.0 g/dL   HCT 81.1 91.4 - 78.2 %   MCV 98.5 78.0 - 100.0 fL   MCH 29.4 26.0 - 34.0 pg   MCHC 29.9 (L) 30.0 -  36.0 g/dL   RDW 95.6 (H) 21.3 - 08.6 %   Platelets 150 150 - 400 K/uL   Neutrophils Relative % 89 %   Neutro Abs 9.4 (H) 1.7 - 7.7 K/uL   Lymphocytes Relative 7 %   Lymphs Abs 0.7 0.7 - 4.0 K/uL   Monocytes Relative 4 %   Monocytes Absolute 0.4 0.1 - 1.0 K/uL   Eosinophils Relative 0 %   Eosinophils Absolute 0.0 0.0 - 0.7 K/uL   Basophils Relative 0 %   Basophils Absolute 0.0 0.0 - 0.1 K/uL  Troponin I     Status: None   Collection Time: 02/22/16 10:35 AM  Result Value Ref Range   Troponin I <0.03 <0.03 ng/mL  Brain natriuretic peptide     Status: None   Collection Time: 02/22/16 10:35 AM  Result Value Ref Range   B Natriuretic Peptide 100.0 0.0 - 100.0 pg/mL  Lactic acid, plasma     Status: None   Collection Time: 02/22/16 10:35 AM  Result Value Ref Range   Lactic Acid, Venous 1.6 0.5 - 1.9 mmol/L  Blood culture (routine x 2)     Status: None (Preliminary result)   Collection Time: 02/22/16 10:35 AM  Result Value Ref Range   Specimen Description BLOOD RIGHT ARM    Special Requests BOTTLES DRAWN AEROBIC ONLY 6CC    Culture PENDING    Report Status PENDING   Blood culture (routine x 2)     Status: None (Preliminary result)   Collection Time: 02/22/16 10:35 AM  Result Value Ref Range   Specimen Description BLOOD RIGHT HAND    Special Requests BOTTLES DRAWN AEROBIC ONLY 6CC    Culture PENDING    Report Status PENDING   Blood gas, arterial ([facility], [facility], [facility])     Status: Abnormal   Collection Time: 02/22/16 10:45 AM  Result Value Ref Range   FIO2 0.45    O2 Content 45.0 L/min   Delivery systems BILEVEL POSITIVE AIRWAY PRESSURE    Mode BILEVEL POSITIVE AIRWAY PRESSURE    Inspiratory PAP 20    Expiratory PAP 6    pH, Arterial 7.241 (L) 7.350 - 7.450   pCO2 arterial 101 (HH) 32.0 - 48.0 mmHg   pO2, Arterial 39.3 (LL) 83.0 - 108.0 mmHg   Bicarbonate 33.1 (H) 20.0 - 28.0 mmol/L   Acid-Base Excess 14.0 (H) 0.0 - 2.0 mmol/L   O2 Saturation 68.7 %   Collection site RIGHT RADIAL     Drawn by [phone]    Sample type VENOUS    Allens test (pass/fail) PASS PASS  Lactic acid, plasma     Status: None   Collection Time: 02/22/16 12:02 PM  Result Value Ref Range   Lactic Acid, Venous 1.0 0.5 - 1.9 mmol/L  Urinalysis, Routine w reflex microscopic     Status: Abnormal   Collection Time: 02/22/16  4:50 PM  Result Value Ref Range   Color, Urine YELLOW YELLOW   APPearance CLEAR CLEAR   Specific Gravity, Urine >1.030 (H) 1.005 - 1.030   pH 5.5 5.0 - 8.0   Glucose, UA 500 (A) NEGATIVE mg/dL   Hgb urine dipstick NEGATIVE NEGATIVE   Bilirubin Urine NEGATIVE NEGATIVE   Ketones, ur NEGATIVE NEGATIVE mg/dL   Protein, ur NEGATIVE NEGATIVE mg/dL   Nitrite NEGATIVE NEGATIVE   Leukocytes, UA NEGATIVE NEGATIVE  MRSA PCR Screening     Status: None   Collection Time: 02/22/16  5:05 PM  Result Value Ref Range   MRSA by PCR NEGATIVE NEGATIVE  Basic metabolic panel     Status: Abnormal   Collection Time: 02/22/16  7:41 PM  Result Value Ref Range   Sodium 137 135 - 145 mmol/L   Potassium 5.1 3.5 - 5.1 mmol/L   Chloride 89 (L) 101 - 111 mmol/L   CO2 42 (H) 22 - 32 mmol/L   Glucose, Bld 303 (H) 65 - 99 mg/dL   BUN 16 6 - 20 mg/dL   Creatinine, Ser 8.65 (L) 0.61 - 1.24 mg/dL   Calcium 9.0 8.9 - 78.4 mg/dL   GFR calc non Af Amer >60 >60 mL/min   GFR calc Af Amer >60 >60 mL/min   Anion gap 6 5 - 15  Glucose, capillary     Status: Abnormal   Collection Time: 02/22/16  9:53 PM  Result Value Ref Range   Glucose-Capillary 283 (H) 65 - 99 mg/dL  Blood gas, arterial     Status: Abnormal   Collection Time: 02/23/16  4:55 AM  Result Value Ref Range   FIO2 45.00    Delivery systems BILEVEL POSITIVE AIRWAY PRESSURE    Inspiratory PAP 20.0    Expiratory PAP 6.0    pH, Arterial 7.358 7.350 - 7.450   pCO2 arterial 77.7 (HH) 32.0 - 48.0 mmHg   pO2, Arterial 50.7 (L) 83.0 - 108.0 mmHg   Bicarbonate 37.1 (H) 20.0 - 28.0 mmol/L   Acid-Base Excess 16.4 (H) 0.0 - 2.0 mmol/L   O2 Saturation  82.2 %   Patient temperature 37.0    Collection site LEFT RADIAL    Drawn by [id number]    Sample type ARTERIAL    Allens test (pass/fail) PASS PASS  Comprehensive metabolic panel     Status: Abnormal   Collection Time: 02/23/16  5:18 AM  Result Value Ref Range   Sodium 135 135 - 145 mmol/L   Potassium 4.7 3.5 - 5.1 mmol/L   Chloride 86 (L) 101 - 111 mmol/L   CO2 42 (H) 22 - 32 mmol/L   Glucose, Bld 282 (H) 65 - 99 mg/dL   BUN 17 6 - 20 mg/dL   Creatinine, Ser 1.61 (L) 0.61 - 1.24 mg/dL   Calcium 9.1 8.9 - 09.6 mg/dL   Total Protein 6.6 6.5 - 8.1 g/dL   Albumin 3.2 (L) 3.5 - 5.0 g/dL   AST 13 (L) 15 - 41 U/L   ALT 17 17 - 63 U/L   Alkaline Phosphatase 58 38 - 126 U/L   Total Bilirubin 0.6 0.3 - 1.2 mg/dL   GFR calc non Af Amer >60 >60 mL/min   GFR calc Af Amer >60 >60 mL/min   Anion gap 7 5 - 15  CBC     Status: Abnormal   Collection Time: 02/23/16  5:18 AM  Result Value Ref Range   WBC 11.3 (H) 4.0 - 10.5 K/uL   RBC 4.58 4.22 - 5.81 MIL/uL   Hemoglobin 13.3 13.0 - 17.0 g/dL   HCT 04.5 40.9 - 81.1 %   MCV 96.5 78.0 - 100.0 fL   MCH 29.0 26.0 - 34.0 pg   MCHC 30.1 30.0 - 36.0 g/dL   RDW 91.4 (H) 78.2 - 95.6 %   Platelets 187 150 - 400 K/uL     ABGS  Recent Labs  02/23/16 0455  PHART 7.358  PO2ART 50.7*  HCO3 37.1*   CULTURES Recent Results (from the past 240 hour(s))  Blood culture (routine x 2)     Status: None (Preliminary result)   Collection Time: 02/22/16 10:35 AM  Result Value Ref Range Status   Specimen Description BLOOD RIGHT ARM  Final   Special Requests BOTTLES DRAWN AEROBIC ONLY 6CC  Final   Culture PENDING  Incomplete   Report Status PENDING  Incomplete  Blood culture (routine x 2)     Status: None (Preliminary result)   Collection Time: 02/22/16 10:35 AM  Result Value Ref Range Status   Specimen Description BLOOD RIGHT HAND  Final   Special Requests BOTTLES DRAWN AEROBIC ONLY 6CC  Final   Culture PENDING  Incomplete   Report Status PENDING   Incomplete  MRSA PCR Screening     Status: None   Collection Time: 02/22/16  5:05 PM  Result Value Ref Range Status   MRSA by PCR NEGATIVE NEGATIVE Final    Comment:        The GeneXpert MRSA Assay (FDA approved for NASAL specimens only), is one component of a comprehensive MRSA colonization surveillance program. It is not intended to diagnose MRSA infection nor to guide or monitor treatment for MRSA infections.    Studies/Results: Dg Chest Port 1 View  Result Date: 02/22/2016 CLINICAL DATA:  Worsening shortness of breath for 3 days. EXAM: PORTABLE CHEST 1 VIEW COMPARISON:  04/04/2015 FINDINGS: Portable semi upright view of the chest was obtained. Limited evaluation of the right lower chest but cannot exclude densities at the right lung base including pleural fluid. Overall, the interstitial lung markings are prominent and suggestive for edema. Heart size is enlarged. IMPRESSION: Prominent lung markings may represent pulmonary edema. Limited evaluation of the right lower chest but cannot exclude right basilar disease including right pleural fluid. Recommend repeat imaging with two views if possible. Electronically Signed   By: Richarda Overlie M.D.   On: 02/22/2016 10:03   Micro Results: Recent Results (from the past 240 hour(s))  Blood culture (routine x 2)     Status: None (Preliminary result)   Collection Time: 02/22/16 10:35 AM  Result Value Ref Range Status   Specimen Description BLOOD RIGHT ARM  Final   Special Requests BOTTLES DRAWN AEROBIC ONLY 6CC  Final   Culture PENDING  Incomplete   Report Status PENDING  Incomplete  Blood culture (routine x 2)     Status: None (Preliminary result)   Collection Time: 02/22/16 10:35 AM  Result Value Ref Range Status   Specimen Description BLOOD RIGHT HAND  Final   Special Requests BOTTLES DRAWN AEROBIC ONLY 6CC  Final   Culture PENDING  Incomplete   Report Status PENDING  Incomplete  MRSA PCR Screening     Status: None   Collection Time:  02/22/16  5:05 PM  Result Value Ref Range Status   MRSA by PCR NEGATIVE NEGATIVE Final    Comment:        The GeneXpert MRSA Assay (FDA approved for NASAL specimens only), is one component of a comprehensive MRSA colonization surveillance program. It is not intended to diagnose MRSA infection nor to guide or monitor treatment for MRSA infections.    Studies/Results: Dg Chest Port 1 View  Result Date: 02/22/2016 CLINICAL DATA:  Worsening shortness of breath for 3 days. EXAM: PORTABLE CHEST 1 VIEW COMPARISON:  04/04/2015 FINDINGS: Portable semi upright view of the chest was obtained. Limited evaluation of the right lower chest but cannot exclude densities at the right lung base including pleural fluid. Overall, the interstitial lung markings are prominent and suggestive for edema. Heart size is enlarged. IMPRESSION: Prominent lung markings may represent pulmonary edema. Limited evaluation of the right lower chest but cannot exclude right basilar disease including right pleural fluid. Recommend repeat imaging with two views if possible. Electronically Signed   By: Richarda Overlie M.D.   On: 02/22/2016 10:03   Medications:  I have reviewed the patient's current medications Scheduled Meds: . albuterol  2.5 mg Nebulization Q6H  . aspirin EC  81 mg Oral Daily  . azithromycin  500 mg Intravenous Q24H  . cefTRIAXone (ROCEPHIN)  IV  1 g Intravenous Q24H  . chlorhexidine  15 mL Mouth Rinse BID  . diltiazem  180 mg Oral Daily  . enoxaparin (LOVENOX) injection  100 mg Subcutaneous Q24H  . insulin aspart  0-5 Units Subcutaneous QHS  . insulin aspart  0-9 Units Subcutaneous TID WC  . insulin detemir  40 Units Subcutaneous QHS  . linagliptin  5 mg Oral Q breakfast   And  . metFORMIN  1,000 mg Oral Q breakfast  . mouth rinse  15 mL Mouth Rinse q12n4p  . metFORMIN  1,000 mg Oral Q supper  . methylPREDNISolone (SOLU-MEDROL) injection  80 mg Intravenous Q8H  . mometasone-formoterol  2 puff Inhalation BID   . pioglitazone  30 mg Oral Daily  . pravastatin  40 mg Oral q1800  . sodium chloride flush  3 mL Intravenous Q12H  . tiotropium  18 mcg Inhalation Daily  . traMADol  50 mg Oral QHS   Continuous Infusions: PRN Meds:.sodium chloride, acetaminophen **OR** acetaminophen, albuterol, sodium chloride flush   Assessment/Plan: #1. Acute on chronic hypoxemic respiratory failure.  Continue BiPAP. Continue nebulizer treatments, Solu-Medrol and IV antibiotics. Chest x-ray reveals a possible right lower lobe infiltrate although 2 view x-ray is pending and should provide better images. ABG this morning was improved with a pH of 7.35, PCO2 77 and PO2 of 50. Oxygen saturation now is 93%. #2. Chronic atrial fibrillation. Continue diltiazem. #3. Diabetes. Continue Levemir and NovoLog.  Glucose is 282.   #4. Obstructive sleep apnea. Family reports that he has recently refused to use C Pap. Active Problems:   COPD exacerbation (HCC)     LOS: 1 day   Falana Clagg 02/23/2016, 7:37 AM

## 2016-02-23 NOTE — Progress Notes (Signed)
Patient's O2 sats dropped mid to low 80's while on Bipap, increase in drowsiness, respiratory therapy notified. MD made aware. orders completed. O2 sats began to drop again, MD notified and came to unit to assess patient. RT adjusted Bipap settings. No further orders given.

## 2016-02-24 LAB — GLUCOSE, CAPILLARY
GLUCOSE-CAPILLARY: 366 mg/dL — AB (ref 65–99)
GLUCOSE-CAPILLARY: 361 mg/dL — AB (ref 65–99)
Glucose-Capillary: 369 mg/dL — ABNORMAL HIGH (ref 65–99)
Glucose-Capillary: 333 mg/dL — ABNORMAL HIGH (ref 65–99)

## 2016-02-24 MED ORDER — INSULIN DETEMIR 100 UNIT/ML ~~LOC~~ SOLN
50.0000 [IU] | Freq: Every day | SUBCUTANEOUS | Status: DC
  Administered 2016-02-24 – 2016-02-25 (×2): 50 [IU] via SUBCUTANEOUS
  Filled 2016-02-24 (×2): qty 0.5

## 2016-02-24 MED ORDER — LIVING WELL WITH DIABETES BOOK
Freq: Once | Status: AC
  Administered 2016-02-24: 1
  Filled 2016-02-24: qty 1

## 2016-02-24 NOTE — Progress Notes (Signed)
RT note- Placed patient to Bipap for sp02 77%, sleepy.

## 2016-02-24 NOTE — Progress Notes (Signed)
RT note- Patient transitioned back to HFNC at 12 L min, awake and receiving medications orally from nursing. Continue to monitor.

## 2016-02-24 NOTE — Progress Notes (Signed)
Subjective: He states he feels better this morning. He denies feeling tight in his chest. He remains on BiPAP. He has had no fever. He required intravenous diltiazem for heart rate control with his chronic atrial fibrillation.  Objective: Vital signs in last 24 hours: Vitals:   02/24/16 0400 02/24/16 0425 02/24/16 0500 02/24/16 0600  BP: 109/68   113/62  Pulse: 93 99  93  Resp: 18 15  14   Temp: 98.6 F (37 C)     TempSrc: Axillary     SpO2: 92% 92%  93%  Weight:   (!) 445 lb 8.8 oz (202.1 kg)   Height:       Weight change: 5 lb 8.8 oz (2.517 kg)  Intake/Output Summary (Last 24 hours) at 02/24/16 0710 Last data filed at 02/24/16 0600  Gross per 24 hour  Intake            105.8 ml  Output             1800 ml  Net          -1694.2 ml    Physical Exam: Alert on BiPAP. Lungs reveal diminished breath sounds. Heart irregularly irregular in the 100 range now on telemetry. Abdomen obese and nontender. Extremities reveal chronic venous insufficiency changes in the lower legs but no edema now.  Lab Results:    Results for orders placed or performed during the hospital encounter of 02/22/16 (from the past 24 hour(s))  Glucose, capillary     Status: Abnormal   Collection Time: 02/23/16 12:54 PM  Result Value Ref Range   Glucose-Capillary 273 (H) 65 - 99 mg/dL  Blood gas, arterial     Status: Abnormal   Collection Time: 02/23/16  2:20 PM  Result Value Ref Range   FIO2 60.00    Delivery systems BILEVEL POSITIVE AIRWAY PRESSURE    Mode BILEVEL POSITIVE AIRWAY PRESSURE    Inspiratory PAP 24    Expiratory PAP 8    pH, Arterial 7.302 (L) 7.350 - 7.450   pCO2 arterial 91.3 (HH) 32.0 - 48.0 mmHg   pO2, Arterial 78.2 (L) 83.0 - 108.0 mmHg   Bicarbonate 37.0 (H) 20.0 - 28.0 mmol/L   Acid-Base Excess 16.7 (H) 0.0 - 2.0 mmol/L   O2 Saturation 93.3 %   Collection site LEFT RADIAL    Drawn by [phone]    Sample type ARTERIAL    Allens test (pass/fail) PASS PASS  Glucose, capillary      Status: Abnormal   Collection Time: 02/23/16  4:43 PM  Result Value Ref Range   Glucose-Capillary 243 (H) 65 - 99 mg/dL  Glucose, capillary     Status: Abnormal   Collection Time: 02/23/16  9:21 PM  Result Value Ref Range   Glucose-Capillary 344 (H) 65 - 99 mg/dL     ABGS  Recent Labs  02/23/16 1420  PHART 7.302*  PO2ART 78.2*  HCO3 37.0*   CULTURES Recent Results (from the past 240 hour(s))  Blood culture (routine x 2)     Status: None (Preliminary result)   Collection Time: 02/22/16 10:35 AM  Result Value Ref Range Status   Specimen Description BLOOD RIGHT ARM  Final   Special Requests BOTTLES DRAWN AEROBIC ONLY 6CC  Final   Culture NO GROWTH 1 DAY  Final   Report Status PENDING  Incomplete  Blood culture (routine x 2)     Status: None (Preliminary result)   Collection Time: 02/22/16 10:35 AM  Result Value Ref Range  Status   Specimen Description BLOOD RIGHT HAND  Final   Special Requests BOTTLES DRAWN AEROBIC ONLY 6CC  Final   Culture NO GROWTH 1 DAY  Final   Report Status PENDING  Incomplete  MRSA PCR Screening     Status: None   Collection Time: 02/22/16  5:05 PM  Result Value Ref Range Status   MRSA by PCR NEGATIVE NEGATIVE Final    Comment:        The GeneXpert MRSA Assay (FDA approved for NASAL specimens only), is one component of a comprehensive MRSA colonization surveillance program. It is not intended to diagnose MRSA infection nor to guide or monitor treatment for MRSA infections.    Studies/Results: Dg Chest Port 1 View  Result Date: 02/22/2016 CLINICAL DATA:  Worsening shortness of breath for 3 days. EXAM: PORTABLE CHEST 1 VIEW COMPARISON:  04/04/2015 FINDINGS: Portable semi upright view of the chest was obtained. Limited evaluation of the right lower chest but cannot exclude densities at the right lung base including pleural fluid. Overall, the interstitial lung markings are prominent and suggestive for edema. Heart size is enlarged. IMPRESSION:  Prominent lung markings may represent pulmonary edema. Limited evaluation of the right lower chest but cannot exclude right basilar disease including right pleural fluid. Recommend repeat imaging with two views if possible. Electronically Signed   By: Richarda Overlie M.D.   On: 02/22/2016 10:03   Micro Results: Recent Results (from the past 240 hour(s))  Blood culture (routine x 2)     Status: None (Preliminary result)   Collection Time: 02/22/16 10:35 AM  Result Value Ref Range Status   Specimen Description BLOOD RIGHT ARM  Final   Special Requests BOTTLES DRAWN AEROBIC ONLY 6CC  Final   Culture NO GROWTH 1 DAY  Final   Report Status PENDING  Incomplete  Blood culture (routine x 2)     Status: None (Preliminary result)   Collection Time: 02/22/16 10:35 AM  Result Value Ref Range Status   Specimen Description BLOOD RIGHT HAND  Final   Special Requests BOTTLES DRAWN AEROBIC ONLY 6CC  Final   Culture NO GROWTH 1 DAY  Final   Report Status PENDING  Incomplete  MRSA PCR Screening     Status: None   Collection Time: 02/22/16  5:05 PM  Result Value Ref Range Status   MRSA by PCR NEGATIVE NEGATIVE Final    Comment:        The GeneXpert MRSA Assay (FDA approved for NASAL specimens only), is one component of a comprehensive MRSA colonization surveillance program. It is not intended to diagnose MRSA infection nor to guide or monitor treatment for MRSA infections.    Studies/Results: Dg Chest Port 1 View  Result Date: 02/22/2016 CLINICAL DATA:  Worsening shortness of breath for 3 days. EXAM: PORTABLE CHEST 1 VIEW COMPARISON:  04/04/2015 FINDINGS: Portable semi upright view of the chest was obtained. Limited evaluation of the right lower chest but cannot exclude densities at the right lung base including pleural fluid. Overall, the interstitial lung markings are prominent and suggestive for edema. Heart size is enlarged. IMPRESSION: Prominent lung markings may represent pulmonary edema. Limited  evaluation of the right lower chest but cannot exclude right basilar disease including right pleural fluid. Recommend repeat imaging with two views if possible. Electronically Signed   By: Richarda Overlie M.D.   On: 02/22/2016 10:03   Medications:  I have reviewed the patient's current medications Scheduled Meds: . albuterol  2.5 mg Nebulization Q6H  .  aspirin EC  81 mg Oral Daily  . azithromycin  500 mg Intravenous Q24H  . cefTRIAXone (ROCEPHIN)  IV  1 g Intravenous Q24H  . chlorhexidine  15 mL Mouth Rinse BID  . enoxaparin (LOVENOX) injection  100 mg Subcutaneous Q24H  . insulin aspart  0-5 Units Subcutaneous QHS  . insulin aspart  0-9 Units Subcutaneous TID WC  . insulin detemir  50 Units Subcutaneous QHS  . linagliptin  5 mg Oral Q breakfast   And  . metFORMIN  1,000 mg Oral Q breakfast  . mouth rinse  15 mL Mouth Rinse q12n4p  . metFORMIN  1,000 mg Oral Q supper  . methylPREDNISolone (SOLU-MEDROL) injection  80 mg Intravenous Q8H  . mometasone-formoterol  2 puff Inhalation BID  . pioglitazone  30 mg Oral Daily  . pravastatin  40 mg Oral q1800  . sodium chloride flush  3 mL Intravenous Q12H  . tiotropium  18 mcg Inhalation Daily  . traMADol  50 mg Oral QHS   Continuous Infusions: . diltiazem (CARDIZEM) infusion 5 mg/hr (02/24/16 0500)   PRN Meds:.sodium chloride, acetaminophen **OR** acetaminophen, albuterol, sodium chloride flush   Assessment/Plan: #1. Acute on chronic respiratory failure with hypoxemia. Continue BiPAP. Consult pulmonology. Continue Solu-Medrol, Rocephin, [name] and albuterol. #2. Diabetes. Increase Levemir to 50 units. Continue NovoLog. #3. Chronic atrial fibrillation. Continue intravenous diltiazem for now. Cardiology has previously determined that he did not require anticoagulation at this point. Active Problems:   COPD exacerbation (HCC)     LOS: 2 days   Robi Mitter 02/24/2016, 7:10 AM

## 2016-02-24 NOTE — Consult Note (Signed)
Consult requested by: Dr. Ouida Sills Consult requested for: Respiratory failure  HPI: This is a 56 year old who came to the emergency department on the fifth with shortness of breath. He has multiple medical problems at baseline including morbid obesity, chronic atrial fib, COPD, sleep apnea, diabetes. He has continued to smoke per the medical record. He also was not using CPAP prior to admission per the medical record. He is on BiPAP now and limited information is available because of his respiratory status. He is able to answer some questions. He specifically denies chest pain nausea vomiting diarrhea hemoptysis. He had some fever and chills at home.  Past Medical History:  Diagnosis Date  . Asthma   . Atrial fibrillation (HCC)   . COPD (chronic obstructive pulmonary disease) (HCC)   . Hyperlipidemia   . Morbid obesity due to excess calories (HCC) 07/27/2014  . Type II or unspecified type diabetes mellitus without mention of complication, not stated as uncontrolled      Family History  Problem Relation Age of Onset  . Asthma Sister   . Heart attack Mother      Social History   Social History  . Marital status: Married    Spouse name: N/A  . Number of children: N/A  . Years of education: N/A   Occupational History  . truck driver    Social History Main Topics  . Smoking status: Former Smoker    Packs/day: 2.00    Years: 34.00    Types: Cigarettes    Quit date: 03/28/2015  . Smokeless tobacco: Never Used     Comment: started at age 58.   Marland Kitchen Alcohol use No  . Drug use: No  . Sexual activity: Yes   Other Topics Concern  . None   Social History Narrative  . None     ROS: Limited ability to obtain review of systems due to his respiratory status    Objective: Vital signs in last 24 hours: Temp:  [98.6 F (37 C)-98.8 F (37.1 C)] 98.6 F (37 C) (02/07 0400) Pulse Rate:  [93-139] 93 (02/07 0600) Resp:  [14-52] 14 (02/07 0600) BP: (109-165)/(62-142) 113/62 (02/07  0600) SpO2:  [92 %-100 %] 93 % (02/07 0600) FiO2 (%):  [60 %] 60 % (02/07 0114) Weight:  [202.1 kg (445 lb 8.8 oz)] 202.1 kg (445 lb 8.8 oz) (02/07 0500) Weight change: 2.517 kg (5 lb 8.8 oz)    Intake/Output from previous day: 02/06 0701 - 02/07 0700 In: 105.8 [I.V.:55.8; IV Piggyback:50] Out: 1800 [Urine:1800]  PHYSICAL EXAM Constitutional: He is morbidly obese. He is on BiPAP. He is still somewhat short of breath. Eyes: Pupils react. EOMI. Ears nose mouth and throat: Hearing is grossly normal. Limited evaluation of his mucous membranes and throat because of the BiPAP. Cardiovascular: He is in atrial fib with a mildly elevated ventricular response. I don't hear a gallop. He doesn't have any edema. Respiratory: He has significant bilateral rhonchi and wheezing. Respiratory effort is increased. He is on BiPAP. Gastrointestinal: His abdomen is obese with normal bowel sounds. Musculoskeletal he is able to move all 4 extremities and strength is grossly normal. Neurological: No focal abnormalities. Psychiatric: Grossly normal mood and affect with limited ability to evaluate skin: Warm and dry  Lab Results: Basic Metabolic Panel:  Recent Labs  16/10/96 1941 02/23/16 0518  NA 137 135  K 5.1 4.7  CL 89* 86*  CO2 42* 42*  GLUCOSE 303* 282*  BUN 16 17  CREATININE 0.59* 0.51*  CALCIUM 9.0 9.1   Liver Function Tests:  Recent Labs  02/22/16 1035 02/23/16 0518  AST 15 13*  ALT 18 17  ALKPHOS 59 58  BILITOT 0.5 0.6  PROT 7.3 6.6  ALBUMIN 3.4* 3.2*   No results for input(s): LIPASE, AMYLASE in the last 72 hours. No results for input(s): AMMONIA in the last 72 hours. CBC:  Recent Labs  02/22/16 1035 02/23/16 0518  WBC 10.5 11.3*  NEUTROABS 9.4*  --   HGB 13.9 13.3  HCT 46.5 44.2  MCV 98.5 96.5  PLT 150 187   Cardiac Enzymes:  Recent Labs  02/22/16 1035  TROPONINI <0.03   BNP: No results for input(s): PROBNP in the last 72 hours. D-Dimer: No results for input(s):  DDIMER in the last 72 hours. CBG:  Recent Labs  02/22/16 2153 02/23/16 1254 02/23/16 1643 02/23/16 2121 02/24/16 0801  GLUCAP 283* 273* 243* 344* 369*   Hemoglobin A1C:  Recent Labs  02/22/16 1846  HGBA1C 11.9*   Fasting Lipid Panel: No results for input(s): CHOL, HDL, LDLCALC, TRIG, CHOLHDL, LDLDIRECT in the last 72 hours. Thyroid Function Tests: No results for input(s): TSH, T4TOTAL, FREET4, T3FREE, THYROIDAB in the last 72 hours. Anemia Panel: No results for input(s): VITAMINB12, FOLATE, FERRITIN, TIBC, IRON, RETICCTPCT in the last 72 hours. Coagulation: No results for input(s): LABPROT, INR in the last 72 hours. Urine Drug Screen: Drugs of Abuse  No results found for: LABOPIA, COCAINSCRNUR, LABBENZ, AMPHETMU, THCU, LABBARB  Alcohol Level: No results for input(s): ETH in the last 72 hours. Urinalysis:  Recent Labs  02/22/16 1650  COLORURINE YELLOW  LABSPEC >1.030*  PHURINE 5.5  GLUCOSEU 500*  HGBUR NEGATIVE  BILIRUBINUR NEGATIVE  KETONESUR NEGATIVE  PROTEINUR NEGATIVE  NITRITE NEGATIVE  LEUKOCYTESUR NEGATIVE   Misc. Labs:   ABGS:  Recent Labs  02/23/16 1420  PHART 7.302*  PO2ART 78.2*  HCO3 37.0*     MICROBIOLOGY: Recent Results (from the past 240 hour(s))  Blood culture (routine x 2)     Status: None (Preliminary result)   Collection Time: 02/22/16 10:35 AM  Result Value Ref Range Status   Specimen Description BLOOD RIGHT ARM  Final   Special Requests BOTTLES DRAWN AEROBIC ONLY 6CC  Final   Culture NO GROWTH 1 DAY  Final   Report Status PENDING  Incomplete  Blood culture (routine x 2)     Status: None (Preliminary result)   Collection Time: 02/22/16 10:35 AM  Result Value Ref Range Status   Specimen Description BLOOD RIGHT HAND  Final   Special Requests BOTTLES DRAWN AEROBIC ONLY 6CC  Final   Culture NO GROWTH 1 DAY  Final   Report Status PENDING  Incomplete  MRSA PCR Screening     Status: None   Collection Time: 02/22/16  5:05 PM   Result Value Ref Range Status   MRSA by PCR NEGATIVE NEGATIVE Final    Comment:        The GeneXpert MRSA Assay (FDA approved for NASAL specimens only), is one component of a comprehensive MRSA colonization surveillance program. It is not intended to diagnose MRSA infection nor to guide or monitor treatment for MRSA infections.     Studies/Results: Dg Chest Port 1 View  Result Date: 02/22/2016 CLINICAL DATA:  Worsening shortness of breath for 3 days. EXAM: PORTABLE CHEST 1 VIEW COMPARISON:  04/04/2015 FINDINGS: Portable semi upright view of the chest was obtained. Limited evaluation of the right lower chest but cannot exclude densities at the  right lung base including pleural fluid. Overall, the interstitial lung markings are prominent and suggestive for edema. Heart size is enlarged. IMPRESSION: Prominent lung markings may represent pulmonary edema. Limited evaluation of the right lower chest but cannot exclude right basilar disease including right pleural fluid. Recommend repeat imaging with two views if possible. Electronically Signed   By: Richarda Overlie M.D.   On: 02/22/2016 10:03    Medications:  Prior to Admission:  Prescriptions Prior to Admission  Medication Sig Dispense Refill Last Dose  . diltiazem (CARDIZEM CD) 180 MG 24 hr capsule Take 180 mg by mouth daily.   11 02/22/2016 at Unknown time  . Fluticasone-Salmeterol (ADVAIR) 250-50 MCG/DOSE AEPB Inhale 1 puff into the lungs 2 (two) times daily. 60 each 5 02/22/2016 at Unknown time  . glimepiride (AMARYL) 4 MG tablet Take 4 mg by mouth daily before breakfast.   02/22/2016 at Unknown time  . insulin detemir (LEVEMIR) 100 UNIT/ML injection Inject 0.4 mLs (40 Units total) into the skin at bedtime. 10 mL 11 02/21/2016 at Unknown time  . ipratropium-albuterol (DUONEB) 0.5-2.5 (3) MG/3ML SOLN Take 3 mLs by nebulization 2 (two) times daily as needed (for wheezing or shortness of breath).    02/22/2016 at Unknown time  . metFORMIN (GLUCOPHAGE)  1000 MG tablet Take 1,000 mg by mouth at bedtime.    02/21/2016 at Unknown time  . Omega-3 Fatty Acids (FISH OIL) 1200 MG CAPS Take 1,200 mg by mouth 2 (two) times daily.   02/22/2016 at Unknown time  . pioglitazone (ACTOS) 30 MG tablet Take 30 mg by mouth daily.   11 02/22/2016 at Unknown time  . pravastatin (PRAVACHOL) 40 MG tablet 40 mg at bedtime.  11 02/21/2016 at Unknown time  . SitaGLIPtin-MetFORMIN HCl [id number] MG TB24 Take 1 tablet by mouth every morning.   02/22/2016 at Unknown time  . traMADol (ULTRAM) 50 MG tablet Take 50 mg by mouth at bedtime.  4 02/21/2016 at Unknown time  . OXYGEN Inhale 5 L into the lungs.   Taking   Scheduled: . albuterol  2.5 mg Nebulization Q6H  . aspirin EC  81 mg Oral Daily  . azithromycin  500 mg Intravenous Q24H  . cefTRIAXone (ROCEPHIN)  IV  1 g Intravenous Q24H  . chlorhexidine  15 mL Mouth Rinse BID  . enoxaparin (LOVENOX) injection  100 mg Subcutaneous Q24H  . insulin aspart  0-5 Units Subcutaneous QHS  . insulin aspart  0-9 Units Subcutaneous TID WC  . insulin detemir  50 Units Subcutaneous QHS  . linagliptin  5 mg Oral Q breakfast   And  . metFORMIN  1,000 mg Oral Q breakfast  . mouth rinse  15 mL Mouth Rinse q12n4p  . metFORMIN  1,000 mg Oral Q supper  . methylPREDNISolone (SOLU-MEDROL) injection  80 mg Intravenous Q8H  . mometasone-formoterol  2 puff Inhalation BID  . pioglitazone  30 mg Oral Daily  . pravastatin  40 mg Oral q1800  . sodium chloride flush  3 mL Intravenous Q12H  . tiotropium  18 mcg Inhalation Daily  . traMADol  50 mg Oral QHS   Continuous: . diltiazem (CARDIZEM) infusion 5 mg/hr (02/24/16 0500)   ZOX:WRUEAV chloride, acetaminophen **OR** acetaminophen, albuterol, sodium chloride flush  Assesment: He has acute on chronic hypoxic and hypercapnic respiratory failure. His PCO2 is in the 90s on BiPAP and his pH is 7.3 but he is awakethink we can avoid intubation and mechanical ventilation for the moment. He is high risk  to require  intubation and mechanical ventilation. He is being appropriately treated for pneumonia I personally reviewed his chest x-ray and I think he does have pneumonia in the right base. This could be reevaluated when he clinically improves but for now would continue inhaled bronchodilators BiPAP antibiotics and steroids diltiazem for A. fib. Active Problems:   COPD exacerbation (HCC)    Plan: As above. Thanks for allowing me to see him with you    LOS: 2 days   Mihcael Ledee L 02/24/2016, 8:15 AM

## 2016-02-25 ENCOUNTER — Inpatient Hospital Stay (HOSPITAL_COMMUNITY): Payer: Medicare Other

## 2016-02-25 LAB — GLUCOSE, CAPILLARY
GLUCOSE-CAPILLARY: 363 mg/dL — AB (ref 65–99)
Glucose-Capillary: 308 mg/dL — ABNORMAL HIGH (ref 65–99)
Glucose-Capillary: 341 mg/dL — ABNORMAL HIGH (ref 65–99)
Glucose-Capillary: 307 mg/dL — ABNORMAL HIGH (ref 65–99)

## 2016-02-25 LAB — BLOOD GAS, ARTERIAL
ACID-BASE EXCESS: 17.5 mmol/L — AB (ref 0.0–2.0)
Bicarbonate: 38.7 mmol/L — ABNORMAL HIGH (ref 20.0–28.0)
DELIVERY SYSTEMS: POSITIVE
Drawn by: 21310
Expiratory PAP: 8
FIO2: 50
Inspiratory PAP: 20
O2 Saturation: 92 %
PATIENT TEMPERATURE: 37
PCO2 ART: 77.4 mmHg — AB (ref 32.0–48.0)
PH ART: 7.371 (ref 7.350–7.450)
pO2, Arterial: 71.9 mmHg — ABNORMAL LOW (ref 83.0–108.0)

## 2016-02-25 MED ORDER — METHYLPREDNISOLONE SODIUM SUCC 40 MG IJ SOLR
40.0000 mg | Freq: Three times a day (TID) | INTRAMUSCULAR | Status: DC
  Administered 2016-02-25 – 2016-02-26 (×3): 40 mg via INTRAVENOUS
  Filled 2016-02-25 (×3): qty 1

## 2016-02-25 MED ORDER — DILTIAZEM HCL 60 MG PO TABS
60.0000 mg | ORAL_TABLET | Freq: Four times a day (QID) | ORAL | Status: DC
  Administered 2016-02-25 – 2016-02-27 (×9): 60 mg via ORAL
  Filled 2016-02-25 (×8): qty 1

## 2016-02-25 NOTE — Progress Notes (Addendum)
Inpatient Diabetes Program Recommendations  AACE/ADA: New Consensus Statement on Inpatient Glycemic Control (2015)  Target Ranges:  Prepandial:   less than 140 mg/dL      Peak postprandial:   less than 180 mg/dL (1-2 hours)      Critically ill patients:  140 - 180 mg/dL   Results for Brett Curry, Antavious P (MRN [id number]) as of 02/25/2016 08:00  Ref. Range 02/24/2016 08:01 02/24/2016 11:39 02/24/2016 16:32 02/24/2016 21:46  Glucose-Capillary Latest Ref Range: 65 - 99 mg/dL 409369 (H) 811366 (H) 914361 (H) 333 (H)   Review of Glycemic Control  Current orders for Inpatient glycemic control: Levemir 50 units QHS, Actos 30 mg daily, Tradjenta 5 mg QAM, Metformin 1000 mg BID, Novolog 0-9 units TID with meals, Novolog 0-5 units QHS  Inpatient Diabetes Program Recommendations:  Insulin-basal: Please consider increasing Levemir 55 units QHS. Insulin - Meal Coverage: Please consider ordering Novolog 5 units TID with meals for meal coverage. HgbA1C: A1C 11.9% on 02/22/16 indicating an average glucose of 295 mg/dl over the past 2-3 months.  Note: Noted steroids were decreased this morning.   Addendum 02/25/16@11 :30-Spoke with patient about diabetes and home regimen for diabetes control. Patient reports that he is followed by PCP for diabetes management.  Patient reports that he is taking all DM medications as prescribed.  Patient states that he is not checking his glucose at all at home. Inquired if he has a glucometer and testing supplies and patient reports that he does have everything at home to check glucose. Inquired about prior A1C and patient reports that he does not recall his last A1C value. Discussed A1C results (11.9% on 02/22/16) and explained that his current A1C indicates an average glucose of 295 mg/dl over the past 2-3 months. Informed patient that his prior A1C in the chart was 9.6% on 04/04/15.  Discussed glucose and A1C goals. Discussed importance of checking CBGs and maintaining good CBG control to prevent long-term  and short-term complications. Explained how hyperglycemia leads to damage within blood vessels which lead to the common complications seen with uncontrolled diabetes. Stressed to the patient the importance of improving glycemic control to prevent further complications from uncontrolled diabetes. Discussed impact of nutrition, exercise, stress, sickness, and medications on diabetes control. Patient states that he eats sweets (his wife makes lots of cakes and he usually has some cake about 9:00 pm each night).  Discussed carbohydrates, carbohydrate goals per day and meal, along with portion sizes. Encouraged patient to check his glucose at least 1-2 times per day and to take his glucometer with him to doctor appointments. Patient verbalized understanding of information discussed and he stated that he will consider what we have talked about and he has no further questions at this time related to diabetes.  Thanks, Orlando PennerMarie Yashika Mask, RN, MSN, CDE Diabetes Coordinator Inpatient Diabetes Program [phone] (Team Pager from 8am to 5pm)

## 2016-02-25 NOTE — Progress Notes (Signed)
Subjective: He feels he is breathing better. He was switched to high flow nasal cannula oxygenation yesterday. He was on BiPAP overnight and remains on BiPAP this morning. No fever. He denies any sputum production.  Objective: Vital signs in last 24 hours: Vitals:   02/25/16 0326 02/25/16 0400 02/25/16 0500 02/25/16 0600  BP:  110/72 129/81 127/85  Pulse: 94 78 86 98  Resp:      Temp:  98 F (36.7 C)    TempSrc:  Axillary    SpO2: 91% 93% 92% 95%  Weight:   (!) 441 lb 12.8 oz (200.4 kg)   Height:       Weight change: -3 lb 12 oz (-1.7 kg)  Intake/Output Summary (Last 24 hours) at 02/25/16 0723 Last data filed at 02/25/16 0644  Gross per 24 hour  Intake              410 ml  Output             2300 ml  Net            -1890 ml    Physical Exam: Alert. No distress. Lungs reveal diminished breath sounds. Heart irregularly irregular in the 90s. Abdomen soft and nontender. Neuro stable.  Lab Results:    Results for orders placed or performed during the hospital encounter of 02/22/16 (from the past 24 hour(s))  Glucose, capillary     Status: Abnormal   Collection Time: 02/24/16  8:01 AM  Result Value Ref Range   Glucose-Capillary 369 (H) 65 - 99 mg/dL  Glucose, capillary     Status: Abnormal   Collection Time: 02/24/16 11:39 AM  Result Value Ref Range   Glucose-Capillary 366 (H) 65 - 99 mg/dL  Glucose, capillary     Status: Abnormal   Collection Time: 02/24/16  4:32 PM  Result Value Ref Range   Glucose-Capillary 361 (H) 65 - 99 mg/dL   Comment 1 Notify RN    Comment 2 Document in Chart   Glucose, capillary     Status: Abnormal   Collection Time: 02/24/16  9:46 PM  Result Value Ref Range   Glucose-Capillary 333 (H) 65 - 99 mg/dL   Comment 1 Notify RN    Comment 2 Document in Chart   Blood gas, arterial     Status: Abnormal   Collection Time: 02/25/16  4:30 AM  Result Value Ref Range   FIO2 50.00    Delivery systems BILEVEL POSITIVE AIRWAY PRESSURE    Inspiratory PAP  20.0    Expiratory PAP 8.0    pH, Arterial 7.371 7.350 - 7.450   pCO2 arterial 77.4 (HH) 32.0 - 48.0 mmHg   pO2, Arterial 71.9 (L) 83.0 - 108.0 mmHg   Bicarbonate 38.7 (H) 20.0 - 28.0 mmol/L   Acid-Base Excess 17.5 (H) 0.0 - 2.0 mmol/L   O2 Saturation 92.0 %   Patient temperature 37.0    Collection site LEFT RADIAL    Drawn by [id number]    Sample type ARTERIAL    Allens test (pass/fail) PASS PASS     ABGS  Recent Labs  02/25/16 0430  PHART 7.371  PO2ART 71.9*  HCO3 38.7*   CULTURES Recent Results (from the past 240 hour(s))  Blood culture (routine x 2)     Status: None (Preliminary result)   Collection Time: 02/22/16 10:35 AM  Result Value Ref Range Status   Specimen Description BLOOD RIGHT ARM  Final   Special Requests BOTTLES DRAWN AEROBIC ONLY 6CC  Final   Culture NO GROWTH 2 DAYS  Final   Report Status PENDING  Incomplete  Blood culture (routine x 2)     Status: None (Preliminary result)   Collection Time: 02/22/16 10:35 AM  Result Value Ref Range Status   Specimen Description BLOOD RIGHT HAND  Final   Special Requests BOTTLES DRAWN AEROBIC ONLY 6CC  Final   Culture NO GROWTH 2 DAYS  Final   Report Status PENDING  Incomplete  MRSA PCR Screening     Status: None   Collection Time: 02/22/16  5:05 PM  Result Value Ref Range Status   MRSA by PCR NEGATIVE NEGATIVE Final    Comment:        The GeneXpert MRSA Assay (FDA approved for NASAL specimens only), is one component of a comprehensive MRSA colonization surveillance program. It is not intended to diagnose MRSA infection nor to guide or monitor treatment for MRSA infections.    Studies/Results: No results found. Micro Results: Recent Results (from the past 240 hour(s))  Blood culture (routine x 2)     Status: None (Preliminary result)   Collection Time: 02/22/16 10:35 AM  Result Value Ref Range Status   Specimen Description BLOOD RIGHT ARM  Final   Special Requests BOTTLES DRAWN AEROBIC ONLY 6CC  Final    Culture NO GROWTH 2 DAYS  Final   Report Status PENDING  Incomplete  Blood culture (routine x 2)     Status: None (Preliminary result)   Collection Time: 02/22/16 10:35 AM  Result Value Ref Range Status   Specimen Description BLOOD RIGHT HAND  Final   Special Requests BOTTLES DRAWN AEROBIC ONLY 6CC  Final   Culture NO GROWTH 2 DAYS  Final   Report Status PENDING  Incomplete  MRSA PCR Screening     Status: None   Collection Time: 02/22/16  5:05 PM  Result Value Ref Range Status   MRSA by PCR NEGATIVE NEGATIVE Final    Comment:        The GeneXpert MRSA Assay (FDA approved for NASAL specimens only), is one component of a comprehensive MRSA colonization surveillance program. It is not intended to diagnose MRSA infection nor to guide or monitor treatment for MRSA infections.    Studies/Results: No results found. Medications:  I have reviewed the patient's current medications Scheduled Meds: . albuterol  2.5 mg Nebulization Q6H  . aspirin EC  81 mg Oral Daily  . azithromycin  500 mg Intravenous Q24H  . cefTRIAXone (ROCEPHIN)  IV  1 g Intravenous Q24H  . chlorhexidine  15 mL Mouth Rinse BID  . diltiazem  60 mg Oral Q6H  . enoxaparin (LOVENOX) injection  100 mg Subcutaneous Q24H  . insulin aspart  0-5 Units Subcutaneous QHS  . insulin aspart  0-9 Units Subcutaneous TID WC  . insulin detemir  50 Units Subcutaneous QHS  . linagliptin  5 mg Oral Q breakfast   And  . metFORMIN  1,000 mg Oral Q breakfast  . mouth rinse  15 mL Mouth Rinse q12n4p  . metFORMIN  1,000 mg Oral Q supper  . methylPREDNISolone (SOLU-MEDROL) injection  40 mg Intravenous Q8H  . mometasone-formoterol  2 puff Inhalation BID  . pioglitazone  30 mg Oral Daily  . pravastatin  40 mg Oral q1800  . sodium chloride flush  3 mL Intravenous Q12H  . tiotropium  18 mcg Inhalation Daily  . traMADol  50 mg Oral QHS   Continuous Infusions: PRN Meds:.sodium chloride, acetaminophen **OR**  acetaminophen, albuterol,  sodium chloride flush   Assessment/Plan: #1. Respiratory failure. Blood gases improved. PH is 7.37 with a PCO2 of 77 and PO2 of 72. #2. Pneumonia. Continue Rocephin and [name]. #3. Atrial fibrillation. Will try to modify back to oral diltiazem today. #4. Diabetes. Glucoses were in the 300s yesterday. Lantus has been increased. Continue NovoLog. Decrease Solu-Medrol dose today.  Discussed with pulmonology. Follow-up chest x-ray pending. Active Problems:   COPD exacerbation (HCC)     LOS: 3 days   Brett Curry 02/25/2016, 7:23 AM

## 2016-02-25 NOTE — Progress Notes (Signed)
Subjective: He says he feels better. No new complaints. He was able to use high flow nasal cannula some yesterday but was back on BiPAP overnight. His pain. No nausea or vomiting. No other new complaints  Objective: Vital signs in last 24 hours: Temp:  [97.9 F (36.6 C)-98.6 F (37 C)] 98 F (36.7 C) (02/08 0400) Pulse Rate:  [78-111] 98 (02/08 0600) Resp:  [18-26] 24 (02/08 0100) BP: (98-138)/(57-98) 127/85 (02/08 0600) SpO2:  [87 %-95 %] 95 % (02/08 0600) FiO2 (%):  [50 %] 50 % (02/08 0257) Weight:  [200.4 kg (441 lb 12.8 oz)] 200.4 kg (441 lb 12.8 oz) (02/08 0500) Weight change: -1.7 kg (-3 lb 12 oz)    Intake/Output from previous day: 02/07 0701 - 02/08 0700 In: 410 [P.O.:240; I.V.:120; IV Piggyback:50] Out: 2300 [Urine:2300]  PHYSICAL EXAM General appearance: alert, cooperative, moderate distress, morbidly obese and On BiPAP Resp: Prolonged expiratory phase. No wheezing now. On BiPAP Cardio: He is in atrial fib with well-controlled ventricular response GI: Obese nontender normal bowel sounds Extremities: extremities normal, atraumatic, no cyanosis or edema Skin warm and dry. Mucous membranes are moist  Lab Results:  Results for orders placed or performed during the hospital encounter of 02/22/16 (from the past 48 hour(s))  Glucose, capillary     Status: Abnormal   Collection Time: 02/23/16 12:54 PM  Result Value Ref Range   Glucose-Capillary 273 (H) 65 - 99 mg/dL  Blood gas, arterial     Status: Abnormal   Collection Time: 02/23/16  2:20 PM  Result Value Ref Range   FIO2 60.00    Delivery systems BILEVEL POSITIVE AIRWAY PRESSURE    Mode BILEVEL POSITIVE AIRWAY PRESSURE    Inspiratory PAP 24    Expiratory PAP 8    pH, Arterial 7.302 (L) 7.350 - 7.450   pCO2 arterial 91.3 (HH) 32.0 - 48.0 mmHg    Comment: CRITICAL RESULT CALLED TO, READ BACK BY AND VERIFIED WITH: HYLTON,L.RN AT 1431 02/23/16 BY BROADNAX,L.RRT,RCP    pO2, Arterial 78.2 (L) 83.0 - 108.0 mmHg    Bicarbonate 37.0 (H) 20.0 - 28.0 mmol/L   Acid-Base Excess 16.7 (H) 0.0 - 2.0 mmol/L   O2 Saturation 93.3 %   Collection site LEFT RADIAL    Drawn by [id number]    Sample type ARTERIAL    Allens test (pass/fail) PASS PASS  Glucose, capillary     Status: Abnormal   Collection Time: 02/23/16  4:43 PM  Result Value Ref Range   Glucose-Capillary 243 (H) 65 - 99 mg/dL  Glucose, capillary     Status: Abnormal   Collection Time: 02/23/16  9:21 PM  Result Value Ref Range   Glucose-Capillary 344 (H) 65 - 99 mg/dL  Glucose, capillary     Status: Abnormal   Collection Time: 02/24/16  8:01 AM  Result Value Ref Range   Glucose-Capillary 369 (H) 65 - 99 mg/dL  Glucose, capillary     Status: Abnormal   Collection Time: 02/24/16 11:39 AM  Result Value Ref Range   Glucose-Capillary 366 (H) 65 - 99 mg/dL  Glucose, capillary     Status: Abnormal   Collection Time: 02/24/16  4:32 PM  Result Value Ref Range   Glucose-Capillary 361 (H) 65 - 99 mg/dL   Comment 1 Notify RN    Comment 2 Document in Chart   Glucose, capillary     Status: Abnormal   Collection Time: 02/24/16  9:46 PM  Result Value Ref Range   Glucose-Capillary 333 (  H) 65 - 99 mg/dL   Comment 1 Notify RN    Comment 2 Document in Chart   Blood gas, arterial     Status: Abnormal   Collection Time: 02/25/16  4:30 AM  Result Value Ref Range   FIO2 50.00    Delivery systems BILEVEL POSITIVE AIRWAY PRESSURE    Inspiratory PAP 20.0    Expiratory PAP 8.0    pH, Arterial 7.371 7.350 - 7.450   pCO2 arterial 77.4 (HH) 32.0 - 48.0 mmHg    Comment: CRITICAL RESULT CALLED TO, READ BACK BY AND VERIFIED WITH:  SMITH F.RN AT 0445 2.8.18 PITTMAN S RRT/RCP    pO2, Arterial 71.9 (L) 83.0 - 108.0 mmHg   Bicarbonate 38.7 (H) 20.0 - 28.0 mmol/L   Acid-Base Excess 17.5 (H) 0.0 - 2.0 mmol/L   O2 Saturation 92.0 %   Patient temperature 37.0    Collection site LEFT RADIAL    Drawn by [id number]    Sample type ARTERIAL    Allens test (pass/fail) PASS PASS     ABGS  Recent Labs  02/25/16 0430  PHART 7.371  PO2ART 71.9*  HCO3 38.7*   CULTURES Recent Results (from the past 240 hour(s))  Blood culture (routine x 2)     Status: None (Preliminary result)   Collection Time: 02/22/16 10:35 AM  Result Value Ref Range Status   Specimen Description BLOOD RIGHT ARM  Final   Special Requests BOTTLES DRAWN AEROBIC ONLY 6CC  Final   Culture NO GROWTH 2 DAYS  Final   Report Status PENDING  Incomplete  Blood culture (routine x 2)     Status: None (Preliminary result)   Collection Time: 02/22/16 10:35 AM  Result Value Ref Range Status   Specimen Description BLOOD RIGHT HAND  Final   Special Requests BOTTLES DRAWN AEROBIC ONLY 6CC  Final   Culture NO GROWTH 2 DAYS  Final   Report Status PENDING  Incomplete  MRSA PCR Screening     Status: None   Collection Time: 02/22/16  5:05 PM  Result Value Ref Range Status   MRSA by PCR NEGATIVE NEGATIVE Final    Comment:        The GeneXpert MRSA Assay (FDA approved for NASAL specimens only), is one component of a comprehensive MRSA colonization surveillance program. It is not intended to diagnose MRSA infection nor to guide or monitor treatment for MRSA infections.    Studies/Results: No results found.  Medications:  Prior to Admission:  Prescriptions Prior to Admission  Medication Sig Dispense Refill Last Dose  . diltiazem (CARDIZEM CD) 180 MG 24 hr capsule Take 180 mg by mouth daily.   11 02/22/2016 at Unknown time  . Fluticasone-Salmeterol (ADVAIR) 250-50 MCG/DOSE AEPB Inhale 1 puff into the lungs 2 (two) times daily. 60 each 5 02/22/2016 at Unknown time  . glimepiride (AMARYL) 4 MG tablet Take 4 mg by mouth daily before breakfast.   02/22/2016 at Unknown time  . insulin detemir (LEVEMIR) 100 UNIT/ML injection Inject 0.4 mLs (40 Units total) into the skin at bedtime. 10 mL 11 02/21/2016 at Unknown time  . ipratropium-albuterol (DUONEB) 0.5-2.5 (3) MG/3ML SOLN Take 3 mLs by nebulization 2 (two) times  daily as needed (for wheezing or shortness of breath).    02/22/2016 at Unknown time  . metFORMIN (GLUCOPHAGE) 1000 MG tablet Take 1,000 mg by mouth at bedtime.    02/21/2016 at Unknown time  . Omega-3 Fatty Acids (FISH OIL) 1200 MG CAPS Take 1,200 mg  by mouth 2 (two) times daily.   02/22/2016 at Unknown time  . pioglitazone (ACTOS) 30 MG tablet Take 30 mg by mouth daily.   11 02/22/2016 at Unknown time  . pravastatin (PRAVACHOL) 40 MG tablet 40 mg at bedtime.  11 02/21/2016 at Unknown time  . SitaGLIPtin-MetFORMIN HCl [phone] MG TB24 Take 1 tablet by mouth every morning.   02/22/2016 at Unknown time  . traMADol (ULTRAM) 50 MG tablet Take 50 mg by mouth at bedtime.  4 02/21/2016 at Unknown time  . OXYGEN Inhale 5 L into the lungs.   Taking   Scheduled: . albuterol  2.5 mg Nebulization Q6H  . aspirin EC  81 mg Oral Daily  . azithromycin  500 mg Intravenous Q24H  . cefTRIAXone (ROCEPHIN)  IV  1 g Intravenous Q24H  . chlorhexidine  15 mL Mouth Rinse BID  . enoxaparin (LOVENOX) injection  100 mg Subcutaneous Q24H  . insulin aspart  0-5 Units Subcutaneous QHS  . insulin aspart  0-9 Units Subcutaneous TID WC  . insulin detemir  50 Units Subcutaneous QHS  . linagliptin  5 mg Oral Q breakfast   And  . metFORMIN  1,000 mg Oral Q breakfast  . mouth rinse  15 mL Mouth Rinse q12n4p  . metFORMIN  1,000 mg Oral Q supper  . methylPREDNISolone (SOLU-MEDROL) injection  80 mg Intravenous Q8H  . mometasone-formoterol  2 puff Inhalation BID  . pioglitazone  30 mg Oral Daily  . pravastatin  40 mg Oral q1800  . sodium chloride flush  3 mL Intravenous Q12H  . tiotropium  18 mcg Inhalation Daily  . traMADol  50 mg Oral QHS   Continuous: . diltiazem (CARDIZEM) infusion 5 mg/hr (02/25/16 0508)   WUJ:WJXBJY chloride, acetaminophen **OR** acetaminophen, albuterol, sodium chloride flush  Assesment: He was admitted with COPD exacerbation and acute on chronic hypoxic/hypercapnic respiratory failure. His PCO2 is quite high.  He did okay on high flow nasal cannula for a while yesterday. He is back on BiPAP now. He's been treated for pneumonia which I think is appropriate. He is still high risk to require intubation and mechanical ventilation. Blood gas shows P CO2 is down into the 70s so that has improved and his pH is better. He is down to 50% oxygen from 60%. Chest x-ray which I personally reviewed doesn't show density in the right base so I think as mentioned it's appropriate to continue treatment for pneumonia  He has atrial fibrillation rapid ventricular response on diltiazem.  He has morbid obesity with obesity hypoventilation. Active Problems:   COPD exacerbation ([facility])    Plan: Continue treatments. See if we can get him to high flow nasal cannula again.     LOS: 3 days   Krithika Tome L 02/25/2016, 7:15 AM

## 2016-02-25 NOTE — Care Management Note (Signed)
Case Management Note  Patient Details  Name: Brett Curry MRN: [id number] Date of Birth: 08-Nov-1960  Subjective/Objective:   Chart reviewed for needs. Patient is from home, ind with ADLs.  Has CPAP, 02 and neb machine PTA. No HH. Currently on Hi flo oxygen at 10 L.               Action/Plan: CM following for needs.   Expected Discharge Date:       02/27/2016           Expected Discharge Plan:  Home w Home Health Services  In-House Referral:  NA  Discharge planning Services  CM Consult  Post Acute Care Choice:    Choice offered to:     DME Arranged:    DME Agency:     HH Arranged:    HH Agency:     Status of Service:     If discussed at [employer] Meetings, dates discussed:    Additional Comments:  Anesa Fronek, Chrystine OilerSharley Diane, RN 02/25/2016, 2:43 PM

## 2016-02-26 LAB — BASIC METABOLIC PANEL
Anion gap: 8 (ref 5–15)
BUN: 26 mg/dL — ABNORMAL HIGH (ref 6–20)
CO2: 40 mmol/L — ABNORMAL HIGH (ref 22–32)
CREATININE: 0.57 mg/dL — AB (ref 0.61–1.24)
Calcium: 8.9 mg/dL (ref 8.9–10.3)
Chloride: 86 mmol/L — ABNORMAL LOW (ref 101–111)
Glucose, Bld: 348 mg/dL — ABNORMAL HIGH (ref 65–99)
Potassium: 4.8 mmol/L (ref 3.5–5.1)
SODIUM: 134 mmol/L — AB (ref 135–145)

## 2016-02-26 LAB — GLUCOSE, CAPILLARY
GLUCOSE-CAPILLARY: 313 mg/dL — AB (ref 65–99)
GLUCOSE-CAPILLARY: 349 mg/dL — AB (ref 65–99)
Glucose-Capillary: 334 mg/dL — ABNORMAL HIGH (ref 65–99)
Glucose-Capillary: 375 mg/dL — ABNORMAL HIGH (ref 65–99)

## 2016-02-26 MED ORDER — PREDNISONE 20 MG PO TABS
40.0000 mg | ORAL_TABLET | Freq: Every day | ORAL | Status: DC
  Administered 2016-02-26 – 2016-02-27 (×2): 40 mg via ORAL
  Filled 2016-02-26 (×2): qty 2

## 2016-02-26 MED ORDER — INSULIN DETEMIR 100 UNIT/ML ~~LOC~~ SOLN
60.0000 [IU] | Freq: Every day | SUBCUTANEOUS | Status: DC
Start: 2016-02-26 — End: 2016-02-27
  Administered 2016-02-26: 60 [IU] via SUBCUTANEOUS
  Filled 2016-02-26 (×3): qty 0.6

## 2016-02-26 NOTE — Progress Notes (Signed)
02/26/16  1515  [facility] has come and speak with patient regarding home health needs. Pt request bari-wheelchair, Case manager is working on getting that for patient, Pt can still be discharge it will be Monday before the chair is available. Also patient refused the portable oxygen tanks and wants to use what he has at home per case manager.

## 2016-02-26 NOTE — Progress Notes (Signed)
Placed patient on bipap at this time to increased oxygenation. Spo2 was 77% on 8lpm HFNC. Pt tolerating bipap well at this time. Rt will continue to monitor.

## 2016-02-26 NOTE — Progress Notes (Signed)
Subjective: He says he feels much better. He stayed on high flow nasal oxygen through the day yesterday and then is back on BiPAP last night but on nasal oxygen and looks like he's at about 8 or 9 L now. He's not sure what kind of oxygen concentrator he has at home. He says he wants to go home. He says his breathing is better. No other new complaints.  Objective: Vital signs in last 24 hours: Temp:  [97.9 F (36.6 C)-99.1 F (37.3 C)] 97.9 F (36.6 C) (02/09 0400) Pulse Rate:  [63-112] 86 (02/09 0500) Resp:  [15-26] 18 (02/09 0500) BP: (97-128)/(53-91) 111/53 (02/09 0500) SpO2:  [87 %-95 %] 91 % (02/09 0500) FiO2 (%):  [50 %] 50 % (02/09 0103) Weight:  [198.5 kg (437 lb 9.8 oz)] 198.5 kg (437 lb 9.8 oz) (02/09 0500) Weight change: -1.9 kg (-4 lb 3 oz) Last BM Date: 02/23/16  Intake/Output from previous day: 02/08 0701 - 02/09 0700 In: 993.7 [P.O.:720; I.V.:23.7; IV Piggyback:250] Out: 2575 [Urine:2575]  PHYSICAL EXAM General appearance: alert, cooperative and morbidly obese Resp: rhonchi bilaterally and wheezes bilaterally Cardio: irregularly irregular rhythm GI: soft, non-tender; bowel sounds normal; no masses,  no organomegaly Extremities: extremities normal, atraumatic, no cyanosis or edema Skin warm and dry. Mucous membranes are moist  Lab Results:  Results for orders placed or performed during the hospital encounter of 02/22/16 (from the past 48 hour(s))  Glucose, capillary     Status: Abnormal   Collection Time: 02/24/16  8:01 AM  Result Value Ref Range   Glucose-Capillary 369 (H) 65 - 99 mg/dL  Glucose, capillary     Status: Abnormal   Collection Time: 02/24/16 11:39 AM  Result Value Ref Range   Glucose-Capillary 366 (H) 65 - 99 mg/dL  Glucose, capillary     Status: Abnormal   Collection Time: 02/24/16  4:32 PM  Result Value Ref Range   Glucose-Capillary 361 (H) 65 - 99 mg/dL   Comment 1 Notify RN    Comment 2 Document in Chart   Glucose, capillary     Status:  Abnormal   Collection Time: 02/24/16  9:46 PM  Result Value Ref Range   Glucose-Capillary 333 (H) 65 - 99 mg/dL   Comment 1 Notify RN    Comment 2 Document in Chart   Blood gas, arterial     Status: Abnormal   Collection Time: 02/25/16  4:30 AM  Result Value Ref Range   FIO2 50.00    Delivery systems BILEVEL POSITIVE AIRWAY PRESSURE    Inspiratory PAP 20.0    Expiratory PAP 8.0    pH, Arterial 7.371 7.350 - 7.450   pCO2 arterial 77.4 (HH) 32.0 - 48.0 mmHg    Comment: CRITICAL RESULT CALLED TO, READ BACK BY AND VERIFIED WITH:  SMITH F.RN AT 0445 2.8.18 PITTMAN S RRT/RCP    pO2, Arterial 71.9 (L) 83.0 - 108.0 mmHg   Bicarbonate 38.7 (H) 20.0 - 28.0 mmol/L   Acid-Base Excess 17.5 (H) 0.0 - 2.0 mmol/L   O2 Saturation 92.0 %   Patient temperature 37.0    Collection site LEFT RADIAL    Drawn by [id number]    Sample type ARTERIAL    Allens test (pass/fail) PASS PASS  Glucose, capillary     Status: Abnormal   Collection Time: 02/25/16  8:26 AM  Result Value Ref Range   Glucose-Capillary 308 (H) 65 - 99 mg/dL   Comment 1 Notify RN    Comment 2 Document in  Chart   Glucose, capillary     Status: Abnormal   Collection Time: 02/25/16 11:41 AM  Result Value Ref Range   Glucose-Capillary 341 (H) 65 - 99 mg/dL  Glucose, capillary     Status: Abnormal   Collection Time: 02/25/16  4:29 PM  Result Value Ref Range   Glucose-Capillary 307 (H) 65 - 99 mg/dL   Comment 1 Notify RN    Comment 2 Document in Chart   Glucose, capillary     Status: Abnormal   Collection Time: 02/25/16  9:12 PM  Result Value Ref Range   Glucose-Capillary 363 (H) 65 - 99 mg/dL   Comment 1 Notify RN   Basic metabolic panel     Status: Abnormal   Collection Time: 02/26/16  4:26 AM  Result Value Ref Range   Sodium 134 (L) 135 - 145 mmol/L   Potassium 4.8 3.5 - 5.1 mmol/L   Chloride 86 (L) 101 - 111 mmol/L   CO2 40 (H) 22 - 32 mmol/L   Glucose, Bld 348 (H) 65 - 99 mg/dL   BUN 26 (H) 6 - 20 mg/dL   Creatinine, Ser  0.57 (L) 0.61 - 1.24 mg/dL   Calcium 8.9 8.9 - 10.3 mg/dL   GFR calc non Af Amer >60 >60 mL/min   GFR calc Af Amer >60 >60 mL/min    Comment: (NOTE) The eGFR has been calculated using the CKD EPI equation. This calculation has not been validated in all clinical situations. eGFR's persistently <60 mL/min signify possible Chronic Kidney Disease.    Anion gap 8 5 - 15    ABGS  Recent Labs  02/25/16 0430  PHART 7.371  PO2ART 71.9*  HCO3 38.7*   CULTURES Recent Results (from the past 240 hour(s))  Blood culture (routine x 2)     Status: None (Preliminary result)   Collection Time: 02/22/16 10:35 AM  Result Value Ref Range Status   Specimen Description BLOOD RIGHT ARM  Final   Special Requests BOTTLES DRAWN AEROBIC ONLY 6CC  Final   Culture NO GROWTH 3 DAYS  Final   Report Status PENDING  Incomplete  Blood culture (routine x 2)     Status: None (Preliminary result)   Collection Time: 02/22/16 10:35 AM  Result Value Ref Range Status   Specimen Description BLOOD RIGHT HAND  Final   Special Requests BOTTLES DRAWN AEROBIC ONLY 6CC  Final   Culture NO GROWTH 3 DAYS  Final   Report Status PENDING  Incomplete  MRSA PCR Screening     Status: None   Collection Time: 02/22/16  5:05 PM  Result Value Ref Range Status   MRSA by PCR NEGATIVE NEGATIVE Final    Comment:        The GeneXpert MRSA Assay (FDA approved for NASAL specimens only), is one component of a comprehensive MRSA colonization surveillance program. It is not intended to diagnose MRSA infection nor to guide or monitor treatment for MRSA infections.    Studies/Results: Dg Chest Port 1 View  Result Date: 02/25/2016 CLINICAL DATA:  Respiratory distress EXAM: PORTABLE CHEST 1 VIEW COMPARISON:  02/22/2016 FINDINGS: Cardiac shadow is mildly enlarged but stable. Right-sided pleural effusion is noted. Diffuse vascular congestion is seen stable from the prior exam. No bony abnormality is noted. IMPRESSION: Changes of CHF  with right-sided pleural effusion. The overall appearance is stable. Electronically Signed   By: Inez Catalina M.D.   On: 02/25/2016 08:24    Medications:  Prior to Admission:  Prescriptions Prior to Admission  Medication Sig Dispense Refill Last Dose  . diltiazem (CARDIZEM CD) 180 MG 24 hr capsule Take 180 mg by mouth daily.   11 02/22/2016 at Unknown time  . Fluticasone-Salmeterol (ADVAIR) 250-50 MCG/DOSE AEPB Inhale 1 puff into the lungs 2 (two) times daily. 60 each 5 02/22/2016 at Unknown time  . glimepiride (AMARYL) 4 MG tablet Take 4 mg by mouth daily before breakfast.   02/22/2016 at Unknown time  . insulin detemir (LEVEMIR) 100 UNIT/ML injection Inject 0.4 mLs (40 Units total) into the skin at bedtime. 10 mL 11 02/21/2016 at Unknown time  . ipratropium-albuterol (DUONEB) 0.5-2.5 (3) MG/3ML SOLN Take 3 mLs by nebulization 2 (two) times daily as needed (for wheezing or shortness of breath).    02/22/2016 at Unknown time  . metFORMIN (GLUCOPHAGE) 1000 MG tablet Take 1,000 mg by mouth at bedtime.    02/21/2016 at Unknown time  . Omega-3 Fatty Acids (FISH OIL) 1200 MG CAPS Take 1,200 mg by mouth 2 (two) times daily.   02/22/2016 at Unknown time  . pioglitazone (ACTOS) 30 MG tablet Take 30 mg by mouth daily.   11 02/22/2016 at Unknown time  . pravastatin (PRAVACHOL) 40 MG tablet 40 mg at bedtime.  11 02/21/2016 at Unknown time  . SitaGLIPtin-MetFORMIN HCl [phone] MG TB24 Take 1 tablet by mouth every morning.   02/22/2016 at Unknown time  . traMADol (ULTRAM) 50 MG tablet Take 50 mg by mouth at bedtime.  4 02/21/2016 at Unknown time  . OXYGEN Inhale 5 L into the lungs.   Taking   Scheduled: . albuterol  2.5 mg Nebulization Q6H  . aspirin EC  81 mg Oral Daily  . azithromycin  500 mg Intravenous Q24H  . cefTRIAXone (ROCEPHIN)  IV  1 g Intravenous Q24H  . chlorhexidine  15 mL Mouth Rinse BID  . diltiazem  60 mg Oral Q6H  . enoxaparin (LOVENOX) injection  100 mg Subcutaneous Q24H  . insulin aspart  0-5 Units  Subcutaneous QHS  . insulin aspart  0-9 Units Subcutaneous TID WC  . insulin detemir  50 Units Subcutaneous QHS  . linagliptin  5 mg Oral Q breakfast   And  . metFORMIN  1,000 mg Oral Q breakfast  . mouth rinse  15 mL Mouth Rinse q12n4p  . metFORMIN  1,000 mg Oral Q supper  . methylPREDNISolone (SOLU-MEDROL) injection  40 mg Intravenous Q8H  . mometasone-formoterol  2 puff Inhalation BID  . pioglitazone  30 mg Oral Daily  . pravastatin  40 mg Oral q1800  . sodium chloride flush  3 mL Intravenous Q12H  . tiotropium  18 mcg Inhalation Daily  . traMADol  50 mg Oral QHS   Continuous:  HWT:UUEKCM chloride, acetaminophen **OR** acetaminophen, albuterol, sodium chloride flush  Assesment: He was admitted with COPD exacerbation and acute on chronic hypoxic and hypercapnic respiratory failure. He required BiPAP and nearly required intubation and mechanical ventilation. He has what appears to be a right lower lobe pneumonia although chest x-ray has been poor quality because of body habitus. He had atrial fib with RVR and that has improved. Active Problems:   COPD exacerbation ([place])    Plan: Discussed with Dr. Willey Blade. Okay to switch to oral meds.    LOS: 4 days   Jodi Criscuolo L 02/26/2016, 7:04 AM

## 2016-02-26 NOTE — Care Management (Signed)
[facility] for increase to 8lpm.

## 2016-02-26 NOTE — Progress Notes (Signed)
Subjective: He feels he is breathing better. He wants to go home. He has minimal sputum production. He has had no fever.  Objective: Vital signs in last 24 hours: Vitals:   02/26/16 0200 02/26/16 0300 02/26/16 0400 02/26/16 0500  BP: 97/63 105/65 117/73 (!) 111/53  Pulse: 85 84 79 86  Resp: 17 (!) 26 15 18   Temp:   97.9 F (36.6 C)   TempSrc:   Oral   SpO2: 92% 92% 93% 91%  Weight:    (!) 437 lb 9.8 oz (198.5 kg)  Height:       Weight change: -4 lb 3 oz (-1.9 kg)  Intake/Output Summary (Last 24 hours) at 02/26/16 0717 Last data filed at 02/26/16 0200  Gross per 24 hour  Intake           993.67 ml  Output             2575 ml  Net         -1581.33 ml    Physical Exam: Alert. No distress. Lungs reveal diminished breath sounds. Heart irregularly irregular with a rate in the 90 range on telemetry. Abdomen obese and nontender.  Lab Results:    Results for orders placed or performed during the hospital encounter of 02/22/16 (from the past 24 hour(s))  Glucose, capillary     Status: Abnormal   Collection Time: 02/25/16  8:26 AM  Result Value Ref Range   Glucose-Capillary 308 (H) 65 - 99 mg/dL   Comment 1 Notify RN    Comment 2 Document in Chart   Glucose, capillary     Status: Abnormal   Collection Time: 02/25/16 11:41 AM  Result Value Ref Range   Glucose-Capillary 341 (H) 65 - 99 mg/dL  Glucose, capillary     Status: Abnormal   Collection Time: 02/25/16  4:29 PM  Result Value Ref Range   Glucose-Capillary 307 (H) 65 - 99 mg/dL   Comment 1 Notify RN    Comment 2 Document in Chart   Glucose, capillary     Status: Abnormal   Collection Time: 02/25/16  9:12 PM  Result Value Ref Range   Glucose-Capillary 363 (H) 65 - 99 mg/dL   Comment 1 Notify RN   Basic metabolic panel     Status: Abnormal   Collection Time: 02/26/16  4:26 AM  Result Value Ref Range   Sodium 134 (L) 135 - 145 mmol/L   Potassium 4.8 3.5 - 5.1 mmol/L   Chloride 86 (L) 101 - 111 mmol/L   CO2 40 (H) 22  - 32 mmol/L   Glucose, Bld 348 (H) 65 - 99 mg/dL   BUN 26 (H) 6 - 20 mg/dL   Creatinine, Ser 1.61 (L) 0.61 - 1.24 mg/dL   Calcium 8.9 8.9 - 09.6 mg/dL   GFR calc non Af Amer >60 >60 mL/min   GFR calc Af Amer >60 >60 mL/min   Anion gap 8 5 - 15     ABGS  Recent Labs  02/25/16 0430  PHART 7.371  PO2ART 71.9*  HCO3 38.7*   CULTURES Recent Results (from the past 240 hour(s))  Blood culture (routine x 2)     Status: None (Preliminary result)   Collection Time: 02/22/16 10:35 AM  Result Value Ref Range Status   Specimen Description BLOOD RIGHT ARM  Final   Special Requests BOTTLES DRAWN AEROBIC ONLY 6CC  Final   Culture NO GROWTH 3 DAYS  Final   Report Status PENDING  Incomplete  Blood culture (routine x 2)     Status: None (Preliminary result)   Collection Time: 02/22/16 10:35 AM  Result Value Ref Range Status   Specimen Description BLOOD RIGHT HAND  Final   Special Requests BOTTLES DRAWN AEROBIC ONLY 6CC  Final   Culture NO GROWTH 3 DAYS  Final   Report Status PENDING  Incomplete  MRSA PCR Screening     Status: None   Collection Time: 02/22/16  5:05 PM  Result Value Ref Range Status   MRSA by PCR NEGATIVE NEGATIVE Final    Comment:        The GeneXpert MRSA Assay (FDA approved for NASAL specimens only), is one component of a comprehensive MRSA colonization surveillance program. It is not intended to diagnose MRSA infection nor to guide or monitor treatment for MRSA infections.    Studies/Results: Dg Chest Port 1 View  Result Date: 02/25/2016 CLINICAL DATA:  Respiratory distress EXAM: PORTABLE CHEST 1 VIEW COMPARISON:  02/22/2016 FINDINGS: Cardiac shadow is mildly enlarged but stable. Right-sided pleural effusion is noted. Diffuse vascular congestion is seen stable from the prior exam. No bony abnormality is noted. IMPRESSION: Changes of CHF with right-sided pleural effusion. The overall appearance is stable. Electronically Signed   By: Alcide Clever M.D.   On:  02/25/2016 08:24   Micro Results: Recent Results (from the past 240 hour(s))  Blood culture (routine x 2)     Status: None (Preliminary result)   Collection Time: 02/22/16 10:35 AM  Result Value Ref Range Status   Specimen Description BLOOD RIGHT ARM  Final   Special Requests BOTTLES DRAWN AEROBIC ONLY 6CC  Final   Culture NO GROWTH 3 DAYS  Final   Report Status PENDING  Incomplete  Blood culture (routine x 2)     Status: None (Preliminary result)   Collection Time: 02/22/16 10:35 AM  Result Value Ref Range Status   Specimen Description BLOOD RIGHT HAND  Final   Special Requests BOTTLES DRAWN AEROBIC ONLY 6CC  Final   Culture NO GROWTH 3 DAYS  Final   Report Status PENDING  Incomplete  MRSA PCR Screening     Status: None   Collection Time: 02/22/16  5:05 PM  Result Value Ref Range Status   MRSA by PCR NEGATIVE NEGATIVE Final    Comment:        The GeneXpert MRSA Assay (FDA approved for NASAL specimens only), is one component of a comprehensive MRSA colonization surveillance program. It is not intended to diagnose MRSA infection nor to guide or monitor treatment for MRSA infections.    Studies/Results: Dg Chest Port 1 View  Result Date: 02/25/2016 CLINICAL DATA:  Respiratory distress EXAM: PORTABLE CHEST 1 VIEW COMPARISON:  02/22/2016 FINDINGS: Cardiac shadow is mildly enlarged but stable. Right-sided pleural effusion is noted. Diffuse vascular congestion is seen stable from the prior exam. No bony abnormality is noted. IMPRESSION: Changes of CHF with right-sided pleural effusion. The overall appearance is stable. Electronically Signed   By: Alcide Clever M.D.   On: 02/25/2016 08:24   Medications:  I have reviewed the patient's current medications Scheduled Meds: . albuterol  2.5 mg Nebulization Q6H  . aspirin EC  81 mg Oral Daily  . azithromycin  500 mg Intravenous Q24H  . cefTRIAXone (ROCEPHIN)  IV  1 g Intravenous Q24H  . chlorhexidine  15 mL Mouth Rinse BID  . diltiazem   60 mg Oral Q6H  . enoxaparin (LOVENOX) injection  100 mg Subcutaneous Q24H  .  insulin aspart  0-5 Units Subcutaneous QHS  . insulin aspart  0-9 Units Subcutaneous TID WC  . insulin detemir  50 Units Subcutaneous QHS  . linagliptin  5 mg Oral Q breakfast   And  . metFORMIN  1,000 mg Oral Q breakfast  . mouth rinse  15 mL Mouth Rinse q12n4p  . metFORMIN  1,000 mg Oral Q supper  . mometasone-formoterol  2 puff Inhalation BID  . pioglitazone  30 mg Oral Daily  . pravastatin  40 mg Oral q1800  . predniSONE  40 mg Oral Q breakfast  . sodium chloride flush  3 mL Intravenous Q12H  . tiotropium  18 mcg Inhalation Daily  . traMADol  50 mg Oral QHS   Continuous Infusions: PRN Meds:.sodium chloride, acetaminophen **OR** acetaminophen, albuterol, sodium chloride flush   Assessment/Plan: #1. Acute on chronic respiratory failure. Improving. Now on nasal cannula at 8 L. Discussed with pulmonology. We'll transfer from the ICU today. Switch Solu-Medrol to prednisone. Continue antibiotics. #2. Diabetes. Continue Levemir, NovoLog, metformin, pioglitazone and substitute for Januvia. #3. Chronic atrial fibrillation. Continue diltiazem. Active Problems:   COPD exacerbation (HCC)     LOS: 4 days   Brett Curry 02/26/2016, 7:17 AM

## 2016-02-26 NOTE — Care Management Note (Addendum)
Case Management Note  Patient Details  Name: Narda BondsRandy P Whitmire MRN: [id number] Date of Birth: May 09, 1960  Expected Discharge Date:     02/27/2016             Expected Discharge Plan:  Home w Home Health Services  In-House Referral:  NA  Discharge planning Services  CM Consult  Post Acute Care Choice:  Durable Medical Equipment Choice offered to:  Patient  DME Arranged:  Oxygen, Wheelchair manual DME Agency:  [facility]  Status of Service:  Completed, signed off  Additional Comments: Pt discharging home 2/10. Pt has oxygen at home PTA. He is on 5lpm at baseline. MD has asked he be able to access 8lpm at DC order placed and [facility]. They are aware pt will DC home tomorrow. Will deliver port tank to room for pt to transport home on 8lpm. Pt also need WC non-emergently [facility] aware and will obtain pt info from chart and have ready for pt in store next week.  Malcolm Metrohildress, Sheniya Garciaperez Demske, RN 02/26/2016, 2:47 PM

## 2016-02-27 LAB — GLUCOSE, CAPILLARY
Glucose-Capillary: 196 mg/dL — ABNORMAL HIGH (ref 65–99)
Glucose-Capillary: 273 mg/dL — ABNORMAL HIGH (ref 65–99)

## 2016-02-27 MED ORDER — DILTIAZEM HCL ER COATED BEADS 180 MG PO CP24
180.0000 mg | ORAL_CAPSULE | Freq: Every day | ORAL | Status: DC
  Administered 2016-02-27: 180 mg via ORAL
  Filled 2016-02-27: qty 1

## 2016-02-27 MED ORDER — CEFUROXIME AXETIL 500 MG PO TABS: 500.0000 mg | ORAL_TABLET | Freq: Two times a day (BID) | ORAL | 0 refills | Status: AC

## 2016-02-27 MED ORDER — PREDNISONE 20 MG PO TABS: 40.0000 mg | ORAL_TABLET | Freq: Every day | ORAL | 0 refills | Status: AC

## 2016-02-27 MED ORDER — ASPIRIN 81 MG PO TBEC: 81.0000 mg | DELAYED_RELEASE_TABLET | Freq: Every day | ORAL | 12 refills | Status: AC

## 2016-02-27 MED ORDER — INSULIN DETEMIR 100 UNIT/ML ~~LOC~~ SOLN: 60.0000 [IU] | Freq: Every day | SUBCUTANEOUS | 11 refills | Status: AC

## 2016-02-27 NOTE — Progress Notes (Addendum)
Patient awaiting portable tanks for discharge home.  Patient has Diltiazem 60 mg ordered.  Pharmacist states that patient may take the home dose of diltiazem to have full coverage until next daily dose tomorrow.  Dr. Felecia ShellingFanta called and gave order for patient to receive Diltiazem 180 mg 24 hr one time dose before discharge for today and to discontinue the Diltiazem 60 mg.  Patient and wife educated that dose of Diltiazem had been given today and to take next dose tomorrow morning.

## 2016-02-27 NOTE — Progress Notes (Addendum)
Patient's IV removed.  Site WNL.  AVS reviewed with patient.  Verbalized understanding of discharge instructions, physician follow-up, medications.  Patient stable and awaiting [facility] to deliver portable tanks for transport home.  Patient upset and wants to go home with his portable tank that will only go up to 5 L [place].  Patient and wife educated again and agreed to wait for delivery of portable tanks.

## 2016-02-27 NOTE — Progress Notes (Signed)
[facility] received oxygen order.  Sue Lushndrea, in intake, reports that they have the order for oxygen tanks and will be delivering them.

## 2016-02-27 NOTE — Progress Notes (Signed)
He is scheduled for discharge which I think is appropriate. He will need follow-up chest x-ray to document clearing of the infiltrate. This could be done in 4-6 weeks as it will take time for the infiltrate to clear on chest x-ray  I will of course sign off at this point. Thanks for allowing me to see him with you

## 2016-02-27 NOTE — Progress Notes (Signed)
[facility] delivered portable tanks for transport home.  [facility] to follow patient home for delivery of new concentrator at home.  Patient's transported by NT via w/c to main entrance for discharge.  Patient's wife provided transport home in private vehicle.

## 2016-02-27 NOTE — Progress Notes (Signed)
Patient does not have portable tank that will go up to 7 Liters of oxygen for discharge home.  [facility] called and notified of need for portable tank.  Order and face sheet faxed to referral support at [facility].

## 2016-02-27 NOTE — Discharge Summary (Signed)
Physician Discharge Summary  Brett Curry EXB:[id number] DOB: [birth year] DOA: 02/22/2016   Admit date: 02/22/2016 Discharge date: 02/27/2016  Discharge Diagnoses:  Active Problems:   COPD exacerbation (HCC)    Wt Readings from Last 3 Encounters:  02/27/16 (!) 439 lb 13.1 oz (199.5 kg)  12/15/15 (!) 420 lb (190.5 kg)  10/28/15 (!) 418 lb (189.6 kg)     Hospital Course:  This patient is a 56 year old male with COPD, diabetes and morbid obesity who presented with marked difficulty breathing. He was found to have a respiratory acidosis and marked hypercapnia. He was treated with BiPAP. Chest x-ray revealed right lower lobe infiltrate consistent with pneumonia and a possible effusion. He was treated with Rocephin and [name]. He was treated with inhaled bronchodilators. He was initially hypoxic. He has been on high flow nasal cannula oxygen at home. He has chronic atrial fibrillation which has been treated with oral diltiazem. He has been followed by cardiology and has not been felt to warrant anticoagulation beyond aspirin.  His respiratory status gradually improved. He required BiPAP less. He was seen in pulmonology consultation by Dr. Juanetta GoslingHawkins. He has been converted to high flow nasal cannula oxygen at 8 L/m which has maintained oxygen saturations in the mid 90s. Abstinence from tobacco use has been discussed. He now commits to abstain from any smoking. He will continue home nebulizer treatments and will continue oral prednisone in a tapered fashion. He was initially treated with Solu-Medrol.  Diabetes has been treated with Levemir and NovoLog. Levemir has been titrated up to 60 units daily. He will also continue his usual oral agents.  Condition at discharge reveals clear lung fields with diminished breath sounds. Heart irregularly irregular in the 90s. Abdomen obese and nontender. Extremities reveal venous stasis changes in the legs but no ulceration or edema now. Neurologic status is grossly  intact.  The patient will be seen in follow-up in my office in one week. Antibiotic therapy will be continued with Ceftin 500 mg twice a day for another 7 days. He has completed therapy with [name]. Will continue duo nebs every 6 hours at home.   Discharge Instructions  Discharge Instructions    Diet - low sodium heart healthy    Complete by:  As directed    Increase activity slowly    Complete by:  As directed      Allergies as of 02/27/2016   No Known Allergies     Medication List    TAKE these medications   aspirin 81 MG EC tablet Take 1 tablet (81 mg total) by mouth daily.   cefUROXime 500 MG tablet Commonly known as:  CEFTIN Take 1 tablet (500 mg total) by mouth 2 (two) times daily with a meal.   diltiazem 180 MG 24 hr capsule Commonly known as:  CARDIZEM CD Take 180 mg by mouth daily.   Fish Oil 1200 MG Caps Take 1,200 mg by mouth 2 (two) times daily.   Fluticasone-Salmeterol 250-50 MCG/DOSE Aepb Commonly known as:  ADVAIR Inhale 1 puff into the lungs 2 (two) times daily.   glimepiride 4 MG tablet Commonly known as:  AMARYL Take 4 mg by mouth daily before breakfast.   insulin detemir 100 UNIT/ML injection Commonly known as:  LEVEMIR Inject 0.6 mLs (60 Units total) into the skin at bedtime. What changed:  how much to take   ipratropium-albuterol 0.5-2.5 (3) MG/3ML Soln Commonly known as:  DUONEB Take 3 mLs by nebulization 2 (two) times daily as needed (for  wheezing or shortness of breath).   metFORMIN 1000 MG tablet Commonly known as:  GLUCOPHAGE Take 1,000 mg by mouth at bedtime.   OXYGEN Inhale 5 L into the lungs.   pioglitazone 30 MG tablet Commonly known as:  ACTOS Take 30 mg by mouth daily.   pravastatin 40 MG tablet Commonly known as:  PRAVACHOL 40 mg at bedtime.   predniSONE 20 MG tablet Commonly known as:  DELTASONE Take 2 tablets (40 mg total) by mouth daily with breakfast.   SitaGLIPtin-MetFORMIN HCl [phone] MG Tb24 Take 1  tablet by mouth every morning.   traMADol 50 MG tablet Commonly known as:  ULTRAM Take 50 mg by mouth at bedtime.            Durable Medical Equipment        Start     Ordered   02/26/16 1455  For home use only DME standard manual wheelchair with seat cushion  Once    Comments:  Patient suffers from COPD which impairs their ability to perform daily activities like ambulation in the home.  A walker will not resolve  issue with performing activities of daily living. A wheelchair will allow patient to safely perform daily activities. Patient can safely propel the wheelchair in the home or has a caregiver who can provide assistance.  Accessories: elevating leg rests (ELRs), wheel locks, extensions and anti-tippers.  Pt weighs 437lbs. Will need bariatric WC   02/26/16 1455   02/26/16 1150  For home use only DME oxygen  Once    Question Answer Comment  Mode or (Route) Nasal cannula   Liters per Minute 8   Frequency Continuous (stationary and portable oxygen unit needed)   Oxygen delivery system Gas      02/26/16 1149       [name] 02/27/2016

## 2016-03-01 LAB — CULTURE, BLOOD (ROUTINE X 2)
CULTURE: NO GROWTH
CULTURE: NO GROWTH

## 2016-04-29 ENCOUNTER — Encounter: Payer: Self-pay | Admitting: Podiatry

## 2016-04-29 ENCOUNTER — Ambulatory Visit (INDEPENDENT_AMBULATORY_CARE_PROVIDER_SITE_OTHER): Payer: Medicare Other | Admitting: Podiatry

## 2016-04-29 VITALS — BP 110/75 | HR 103

## 2016-04-29 DIAGNOSIS — B351 Tinea unguium: Secondary | ICD-10-CM

## 2016-04-29 DIAGNOSIS — M79676 Pain in unspecified toe(s): Secondary | ICD-10-CM

## 2016-04-29 DIAGNOSIS — E1159 Type 2 diabetes mellitus with other circulatory complications: Secondary | ICD-10-CM | POA: Diagnosis not present

## 2016-04-29 NOTE — Progress Notes (Signed)
   Subjective:    Patient ID: Brett Curry, male    DOB: [birth year], 56 y.o.   MRN: [id number]  HPI this patient presents the office today for an evaluation of both feet and the trimming of nails. He states that the nails are painful walking and wearing his shoes. He is unable to self treat this patient is a type II poorly controlled diabetic patient. Patient has significant venous stasis from his knees down through his feet. This patient had difficulty breathing due to the fact he does not have  portable oxygen.    Review of Systems  Musculoskeletal: Positive for back pain.       Muscle,joint pain       Objective:   Physical Exam GENERAL APPEARANCE: Alert, conversant. Appropriately groomed. No acute distress.  VASCULAR: Pedal pulses are not   palpable at  [facility] and PT bilateral.  Capillary refill time is immediate to all digits,  Normal temperature gradient.  Severe venous stasis noted B/L.  Purplish discoloration noted both feet and lower legs. NEUROLOGIC: sensation is diminished to 5.07 monofilament at 5/5 sites bilateral.  Light touch is intact bilateral, Muscle strength normal.  MUSCULOSKELETAL: acceptable muscle strength, tone and stability bilateral.  Intrinsic muscluature intact bilateral.  Rectus appearance of foot and digits noted bilateral.   DERMATOLOGIC: skin color, texture, and turgor are within normal limits.  No preulcerative lesions or ulcers  are seen, no interdigital maceration noted.  No open lesions present.   No drainage noted.  NAILS  THick disfigured discolored nails both feet.         Assessment & Plan:  Onychomycosis  B/L  Diabetes with angiopathy with venous stasis.  IE  Debridement of nails  B/L  RTC 10 weeks for preventive foot care services.   Helane Gunther DPM

## 2016-05-15 ENCOUNTER — Emergency Department (HOSPITAL_COMMUNITY): Payer: Medicare Other

## 2016-05-15 ENCOUNTER — Encounter (HOSPITAL_COMMUNITY): Payer: Self-pay | Admitting: *Deleted

## 2016-05-15 ENCOUNTER — Inpatient Hospital Stay (HOSPITAL_COMMUNITY)
Admission: EM | Admit: 2016-05-15 | Discharge: 2016-06-17 | DRG: 207 | Disposition: E | Payer: Medicare Other | Attending: Internal Medicine | Admitting: Internal Medicine

## 2016-05-15 DIAGNOSIS — J9622 Acute and chronic respiratory failure with hypercapnia: Principal | ICD-10-CM | POA: Diagnosis present

## 2016-05-15 DIAGNOSIS — E870 Hyperosmolality and hypernatremia: Secondary | ICD-10-CM | POA: Diagnosis not present

## 2016-05-15 DIAGNOSIS — E1165 Type 2 diabetes mellitus with hyperglycemia: Secondary | ICD-10-CM | POA: Diagnosis not present

## 2016-05-15 DIAGNOSIS — J90 Pleural effusion, not elsewhere classified: Secondary | ICD-10-CM | POA: Diagnosis present

## 2016-05-15 DIAGNOSIS — J069 Acute upper respiratory infection, unspecified: Secondary | ICD-10-CM | POA: Diagnosis present

## 2016-05-15 DIAGNOSIS — I110 Hypertensive heart disease with heart failure: Secondary | ICD-10-CM | POA: Diagnosis present

## 2016-05-15 DIAGNOSIS — R6521 Severe sepsis with septic shock: Secondary | ICD-10-CM | POA: Diagnosis not present

## 2016-05-15 DIAGNOSIS — K5900 Constipation, unspecified: Secondary | ICD-10-CM

## 2016-05-15 DIAGNOSIS — R001 Bradycardia, unspecified: Secondary | ICD-10-CM | POA: Diagnosis not present

## 2016-05-15 DIAGNOSIS — Z79899 Other long term (current) drug therapy: Secondary | ICD-10-CM

## 2016-05-15 DIAGNOSIS — Z6841 Body Mass Index (BMI) 40.0 and over, adult: Secondary | ICD-10-CM | POA: Diagnosis not present

## 2016-05-15 DIAGNOSIS — Z515 Encounter for palliative care: Secondary | ICD-10-CM | POA: Diagnosis not present

## 2016-05-15 DIAGNOSIS — I5031 Acute diastolic (congestive) heart failure: Secondary | ICD-10-CM | POA: Diagnosis not present

## 2016-05-15 DIAGNOSIS — J151 Pneumonia due to Pseudomonas: Secondary | ICD-10-CM | POA: Diagnosis present

## 2016-05-15 DIAGNOSIS — A419 Sepsis, unspecified organism: Secondary | ICD-10-CM | POA: Diagnosis not present

## 2016-05-15 DIAGNOSIS — E875 Hyperkalemia: Secondary | ICD-10-CM | POA: Diagnosis not present

## 2016-05-15 DIAGNOSIS — E785 Hyperlipidemia, unspecified: Secondary | ICD-10-CM | POA: Diagnosis not present

## 2016-05-15 DIAGNOSIS — J441 Chronic obstructive pulmonary disease with (acute) exacerbation: Secondary | ICD-10-CM | POA: Diagnosis present

## 2016-05-15 DIAGNOSIS — I480 Paroxysmal atrial fibrillation: Secondary | ICD-10-CM | POA: Diagnosis present

## 2016-05-15 DIAGNOSIS — K567 Ileus, unspecified: Secondary | ICD-10-CM | POA: Diagnosis not present

## 2016-05-15 DIAGNOSIS — G9340 Encephalopathy, unspecified: Secondary | ICD-10-CM | POA: Diagnosis not present

## 2016-05-15 DIAGNOSIS — B9789 Other viral agents as the cause of diseases classified elsewhere: Secondary | ICD-10-CM | POA: Diagnosis present

## 2016-05-15 DIAGNOSIS — J9602 Acute respiratory failure with hypercapnia: Secondary | ICD-10-CM

## 2016-05-15 DIAGNOSIS — E878 Other disorders of electrolyte and fluid balance, not elsewhere classified: Secondary | ICD-10-CM

## 2016-05-15 DIAGNOSIS — E876 Hypokalemia: Secondary | ICD-10-CM | POA: Diagnosis present

## 2016-05-15 DIAGNOSIS — J9621 Acute and chronic respiratory failure with hypoxia: Secondary | ICD-10-CM | POA: Diagnosis present

## 2016-05-15 DIAGNOSIS — Z8249 Family history of ischemic heart disease and other diseases of the circulatory system: Secondary | ICD-10-CM

## 2016-05-15 DIAGNOSIS — E871 Hypo-osmolality and hyponatremia: Secondary | ICD-10-CM | POA: Diagnosis not present

## 2016-05-15 DIAGNOSIS — R000 Tachycardia, unspecified: Secondary | ICD-10-CM | POA: Diagnosis not present

## 2016-05-15 DIAGNOSIS — J181 Lobar pneumonia, unspecified organism: Secondary | ICD-10-CM | POA: Diagnosis not present

## 2016-05-15 DIAGNOSIS — Z7982 Long term (current) use of aspirin: Secondary | ICD-10-CM

## 2016-05-15 DIAGNOSIS — Z7952 Long term (current) use of systemic steroids: Secondary | ICD-10-CM

## 2016-05-15 DIAGNOSIS — Z781 Physical restraint status: Secondary | ICD-10-CM

## 2016-05-15 DIAGNOSIS — E662 Morbid (severe) obesity with alveolar hypoventilation: Secondary | ICD-10-CM | POA: Diagnosis present

## 2016-05-15 DIAGNOSIS — Z87891 Personal history of nicotine dependence: Secondary | ICD-10-CM

## 2016-05-15 DIAGNOSIS — Z9119 Patient's noncompliance with other medical treatment and regimen: Secondary | ICD-10-CM

## 2016-05-15 DIAGNOSIS — Y95 Nosocomial condition: Secondary | ICD-10-CM | POA: Diagnosis not present

## 2016-05-15 DIAGNOSIS — J9690 Respiratory failure, unspecified, unspecified whether with hypoxia or hypercapnia: Secondary | ICD-10-CM

## 2016-05-15 DIAGNOSIS — J810 Acute pulmonary edema: Secondary | ICD-10-CM

## 2016-05-15 DIAGNOSIS — J189 Pneumonia, unspecified organism: Secondary | ICD-10-CM | POA: Diagnosis not present

## 2016-05-15 DIAGNOSIS — J9601 Acute respiratory failure with hypoxia: Secondary | ICD-10-CM

## 2016-05-15 DIAGNOSIS — E874 Mixed disorder of acid-base balance: Secondary | ICD-10-CM | POA: Diagnosis present

## 2016-05-15 DIAGNOSIS — Z825 Family history of asthma and other chronic lower respiratory diseases: Secondary | ICD-10-CM

## 2016-05-15 DIAGNOSIS — Z66 Do not resuscitate: Secondary | ICD-10-CM | POA: Diagnosis not present

## 2016-05-15 DIAGNOSIS — G9341 Metabolic encephalopathy: Secondary | ICD-10-CM | POA: Diagnosis present

## 2016-05-15 DIAGNOSIS — G4733 Obstructive sleep apnea (adult) (pediatric): Secondary | ICD-10-CM | POA: Diagnosis present

## 2016-05-15 DIAGNOSIS — D696 Thrombocytopenia, unspecified: Secondary | ICD-10-CM | POA: Diagnosis not present

## 2016-05-15 DIAGNOSIS — Z794 Long term (current) use of insulin: Secondary | ICD-10-CM

## 2016-05-15 DIAGNOSIS — M7989 Other specified soft tissue disorders: Secondary | ICD-10-CM | POA: Diagnosis not present

## 2016-05-15 DIAGNOSIS — E861 Hypovolemia: Secondary | ICD-10-CM | POA: Diagnosis not present

## 2016-05-15 DIAGNOSIS — R451 Restlessness and agitation: Secondary | ICD-10-CM | POA: Diagnosis present

## 2016-05-15 DIAGNOSIS — Z9981 Dependence on supplemental oxygen: Secondary | ICD-10-CM

## 2016-05-15 LAB — STREP PNEUMONIAE URINARY ANTIGEN: STREP PNEUMO URINARY ANTIGEN: NEGATIVE

## 2016-05-15 LAB — POCT I-STAT 3, ART BLOOD GAS (G3+)
ACID-BASE EXCESS: 13 mmol/L — AB (ref 0.0–2.0)
BICARBONATE: 42.1 mmol/L — AB (ref 20.0–28.0)
O2 SAT: 91 %
PO2 ART: 66 mmHg — AB (ref 83.0–108.0)
TCO2: 44 mmol/L (ref 0–100)
pCO2 arterial: 71.9 mmHg (ref 32.0–48.0)
pH, Arterial: 7.375 (ref 7.350–7.450)

## 2016-05-15 LAB — BLOOD GAS, ARTERIAL
Drawn by: 330991
FIO2: 100
MECHVT: 500 mL
O2 SAT: 98.6 %
PATIENT TEMPERATURE: 36.4
PEEP/CPAP: 5 cmH2O
PO2 ART: 178 mmHg — AB (ref 83.0–108.0)
RATE: 22 resp/min
pH, Arterial: 7.118 — CL (ref 7.350–7.450)

## 2016-05-15 LAB — I-STAT TROPONIN, ED: TROPONIN I, POC: 0 ng/mL (ref 0.00–0.08)

## 2016-05-15 LAB — CBC WITH DIFFERENTIAL/PLATELET
BASOS ABS: 0 10*3/uL (ref 0.0–0.1)
Basophils Relative: 0 %
EOS ABS: 0 10*3/uL (ref 0.0–0.7)
Eosinophils Relative: 0 %
HEMATOCRIT: 47.8 % (ref 39.0–52.0)
HEMOGLOBIN: 13.9 g/dL (ref 13.0–17.0)
Lymphocytes Relative: 13 %
Lymphs Abs: 1.7 10*3/uL (ref 0.7–4.0)
MCH: 28.9 pg (ref 26.0–34.0)
MCHC: 29.1 g/dL — AB (ref 30.0–36.0)
MCV: 99.4 fL (ref 78.0–100.0)
MONO ABS: 1.1 10*3/uL — AB (ref 0.1–1.0)
MONOS PCT: 8 %
NEUTROS ABS: 10.2 10*3/uL — AB (ref 1.7–7.7)
NEUTROS PCT: 79 %
Platelets: 200 10*3/uL (ref 150–400)
RBC: 4.81 MIL/uL (ref 4.22–5.81)
RDW: 15.5 % (ref 11.5–15.5)
WBC: 13 10*3/uL — ABNORMAL HIGH (ref 4.0–10.5)

## 2016-05-15 LAB — I-STAT CG4 LACTIC ACID, ED: Lactic Acid, Venous: 0.86 mmol/L (ref 0.5–1.9)

## 2016-05-15 LAB — BASIC METABOLIC PANEL
Anion gap: 5 (ref 5–15)
Anion gap: 11 (ref 5–15)
BUN: 14 mg/dL (ref 6–20)
BUN: 12 mg/dL (ref 6–20)
CALCIUM: 9 mg/dL (ref 8.9–10.3)
CHLORIDE: 87 mmol/L — AB (ref 101–111)
CO2: 43 mmol/L — ABNORMAL HIGH (ref 22–32)
CO2: 37 mmol/L — AB (ref 22–32)
CREATININE: 0.66 mg/dL (ref 0.61–1.24)
Calcium: 9 mg/dL (ref 8.9–10.3)
Chloride: 85 mmol/L — ABNORMAL LOW (ref 101–111)
Creatinine, Ser: 0.66 mg/dL (ref 0.61–1.24)
GFR calc non Af Amer: >60 mL/min (ref >60)
GLUCOSE: 338 mg/dL — AB (ref 65–99)
Glucose, Bld: 324 mg/dL — ABNORMAL HIGH (ref 65–99)
Potassium: 6 mmol/L — ABNORMAL HIGH (ref 3.5–5.1)
Potassium: 4 mmol/L (ref 3.5–5.1)
SODIUM: 135 mmol/L (ref 135–145)
Sodium: 133 mmol/L — ABNORMAL LOW (ref 135–145)

## 2016-05-15 LAB — TROPONIN I
Troponin I: <0.03 ng/mL (ref <0.03)
Troponin I: <0.03 ng/mL (ref <0.03)
Troponin I: <0.03 ng/mL (ref <0.03)

## 2016-05-15 LAB — GLUCOSE, CAPILLARY
GLUCOSE-CAPILLARY: 301 mg/dL — AB (ref 65–99)
Glucose-Capillary: 354 mg/dL — ABNORMAL HIGH (ref 65–99)
Glucose-Capillary: 301 mg/dL — ABNORMAL HIGH (ref 65–99)
Glucose-Capillary: 310 mg/dL — ABNORMAL HIGH (ref 65–99)
Glucose-Capillary: 295 mg/dL — ABNORMAL HIGH (ref 65–99)

## 2016-05-15 LAB — RESPIRATORY PANEL BY PCR
ADENOVIRUS-RVPPCR: NOT DETECTED
Bordetella pertussis: NOT DETECTED
CHLAMYDOPHILA PNEUMONIAE-RVPPCR: NOT DETECTED
CORONAVIRUS 229E-RVPPCR: NOT DETECTED
CORONAVIRUS HKU1-RVPPCR: NOT DETECTED
CORONAVIRUS NL63-RVPPCR: NOT DETECTED
Coronavirus OC43: NOT DETECTED
INFLUENZA A-RVPPCR: NOT DETECTED
Influenza B: NOT DETECTED
MYCOPLASMA PNEUMONIAE-RVPPCR: NOT DETECTED
Metapneumovirus: NOT DETECTED
PARAINFLUENZA VIRUS 1-RVPPCR: NOT DETECTED
PARAINFLUENZA VIRUS 2-RVPPCR: NOT DETECTED
PARAINFLUENZA VIRUS 3-RVPPCR: NOT DETECTED
PARAINFLUENZA VIRUS 4-RVPPCR: NOT DETECTED
RESPIRATORY SYNCYTIAL VIRUS-RVPPCR: NOT DETECTED
Rhinovirus / Enterovirus: DETECTED — AB

## 2016-05-15 LAB — CBG MONITORING, ED: Glucose-Capillary: 312 mg/dL — ABNORMAL HIGH (ref 65–99)

## 2016-05-15 LAB — POTASSIUM: Potassium: 5.6 mmol/L — ABNORMAL HIGH (ref 3.5–5.1)

## 2016-05-15 LAB — BRAIN NATRIURETIC PEPTIDE: B Natriuretic Peptide: 119 pg/mL — ABNORMAL HIGH (ref 0.0–100.0)

## 2016-05-15 LAB — MRSA PCR SCREENING: MRSA by PCR: NEGATIVE

## 2016-05-15 LAB — PROCALCITONIN

## 2016-05-15 MED ORDER — SODIUM POLYSTYRENE SULFONATE 15 GM/60ML PO SUSP
30.0000 g | Freq: Once | ORAL | Status: AC
  Administered 2016-05-15: 30 g
  Filled 2016-05-15: qty 120

## 2016-05-15 MED ORDER — INSULIN DETEMIR 100 UNIT/ML ~~LOC~~ SOLN
40.0000 [IU] | Freq: Every day | SUBCUTANEOUS | Status: DC
  Administered 2016-05-15: 40 [IU] via SUBCUTANEOUS
  Filled 2016-05-15: qty 0.4

## 2016-05-15 MED ORDER — FENTANYL BOLUS VIA INFUSION
50.0000 ug | INTRAVENOUS | Status: DC | PRN
  Administered 2016-05-15 – 2016-05-27 (×23): 50 ug via INTRAVENOUS
  Filled 2016-05-15: qty 50

## 2016-05-15 MED ORDER — ETOMIDATE 2 MG/ML IV SOLN
INTRAVENOUS | Status: AC | PRN
  Administered 2016-05-15: 20 mg via INTRAVENOUS

## 2016-05-15 MED ORDER — FENTANYL CITRATE (PF) 2500 MCG/50ML IJ SOLN
INTRAMUSCULAR | Status: AC
  Filled 2016-05-15: qty 50

## 2016-05-15 MED ORDER — IPRATROPIUM-ALBUTEROL 0.5-2.5 (3) MG/3ML IN SOLN: 3.0000 mL | Freq: Four times a day (QID) | RESPIRATORY_TRACT | Status: DC

## 2016-05-15 MED ORDER — MOMETASONE FURO-FORMOTEROL FUM 200-5 MCG/ACT IN AERO
2.0000 | INHALATION_SPRAY | Freq: Two times a day (BID) | RESPIRATORY_TRACT | Status: DC
  Filled 2016-05-15: qty 8.8

## 2016-05-15 MED ORDER — DILTIAZEM LOAD VIA INFUSION
10.0000 mg | Freq: Once | INTRAVENOUS | Status: DC
  Filled 2016-05-15: qty 10

## 2016-05-15 MED ORDER — VANCOMYCIN HCL 10 G IV SOLR
2500.0000 mg | Freq: Once | INTRAVENOUS | Status: AC
  Administered 2016-05-15: 2500 mg via INTRAVENOUS
  Filled 2016-05-15: qty 2500

## 2016-05-15 MED ORDER — SODIUM CHLORIDE 0.9 % IV SOLN
250.0000 mL | INTRAVENOUS | Status: DC | PRN
  Administered 2016-05-19 – 2016-05-27 (×6): 250 mL via INTRAVENOUS

## 2016-05-15 MED ORDER — IPRATROPIUM-ALBUTEROL 0.5-2.5 (3) MG/3ML IN SOLN
3.0000 mL | Freq: Once | RESPIRATORY_TRACT | Status: AC
  Administered 2016-05-15: 3 mL via RESPIRATORY_TRACT
  Filled 2016-05-15: qty 42

## 2016-05-15 MED ORDER — IPRATROPIUM-ALBUTEROL 0.5-2.5 (3) MG/3ML IN SOLN
RESPIRATORY_TRACT | Status: AC
  Administered 2016-05-15: 3 mL
  Filled 2016-05-15: qty 3

## 2016-05-15 MED ORDER — FENTANYL CITRATE (PF) 100 MCG/2ML IJ SOLN: 50.0000 ug | Freq: Once | INTRAMUSCULAR | Status: DC

## 2016-05-15 MED ORDER — PROPOFOL 1000 MG/100ML IV EMUL
INTRAVENOUS | Status: AC
  Administered 2016-05-15: 20 ug/kg/min via INTRAVENOUS
  Filled 2016-05-15: qty 100

## 2016-05-15 MED ORDER — MIDAZOLAM HCL 2 MG/2ML IJ SOLN
2.0000 mg | Freq: Once | INTRAMUSCULAR | Status: AC
  Administered 2016-05-15: 2 mg via INTRAVENOUS

## 2016-05-15 MED ORDER — ASPIRIN 81 MG PO CHEW
324.0000 mg | CHEWABLE_TABLET | ORAL | Status: AC
Start: 2016-05-15 — End: 2016-05-15
  Administered 2016-05-15: 324 mg via ORAL
  Filled 2016-05-15: qty 4

## 2016-05-15 MED ORDER — SUCCINYLCHOLINE CHLORIDE 20 MG/ML IJ SOLN
INTRAMUSCULAR | Status: AC | PRN
  Administered 2016-05-15: 150 mg via INTRAVENOUS

## 2016-05-15 MED ORDER — DEXTROSE 5 % IV SOLN
1.0000 g | Freq: Three times a day (TID) | INTRAVENOUS | Status: DC
  Administered 2016-05-15 – 2016-05-18 (×9): 1 g via INTRAVENOUS
  Filled 2016-05-15 (×10): qty 1

## 2016-05-15 MED ORDER — MIDAZOLAM HCL 2 MG/2ML IJ SOLN
2.0000 mg | INTRAMUSCULAR | Status: DC | PRN
  Administered 2016-05-23: 2 mg via INTRAVENOUS
  Filled 2016-05-15 (×2): qty 2

## 2016-05-15 MED ORDER — DILTIAZEM HCL-DEXTROSE 100-5 MG/100ML-% IV SOLN (PREMIX)
5.0000 mg/h | INTRAVENOUS | Status: AC
  Administered 2016-05-15: 7.5 mg/h via INTRAVENOUS
  Administered 2016-05-15: 5 mg/h via INTRAVENOUS
  Administered 2016-05-16: 15 mg/h via INTRAVENOUS
  Administered 2016-05-16 (×2): 10 mg/h via INTRAVENOUS
  Filled 2016-05-15 (×5): qty 100

## 2016-05-15 MED ORDER — IPRATROPIUM-ALBUTEROL 0.5-2.5 (3) MG/3ML IN SOLN
3.0000 mL | RESPIRATORY_TRACT | Status: DC
  Administered 2016-05-15 – 2016-05-23 (×48): 3 mL via RESPIRATORY_TRACT
  Filled 2016-05-15 (×48): qty 3

## 2016-05-15 MED ORDER — MIDAZOLAM HCL 2 MG/2ML IJ SOLN
2.0000 mg | INTRAMUSCULAR | Status: AC | PRN
  Administered 2016-05-15 – 2016-05-16 (×3): 2 mg via INTRAVENOUS
  Filled 2016-05-15 (×4): qty 2

## 2016-05-15 MED ORDER — CHLORHEXIDINE GLUCONATE 0.12% ORAL RINSE (MEDLINE KIT)
15.0000 mL | Freq: Two times a day (BID) | OROMUCOSAL | Status: DC
  Administered 2016-05-15 – 2016-05-28 (×27): 15 mL via OROMUCOSAL

## 2016-05-15 MED ORDER — PANTOPRAZOLE SODIUM 40 MG IV SOLR
40.0000 mg | Freq: Every day | INTRAVENOUS | Status: DC
  Administered 2016-05-15 – 2016-05-17 (×3): 40 mg via INTRAVENOUS
  Filled 2016-05-15 (×3): qty 40

## 2016-05-15 MED ORDER — PROPOFOL 1000 MG/100ML IV EMUL
5.0000 ug/kg/min | INTRAVENOUS | Status: DC
  Administered 2016-05-15: 20 ug/kg/min via INTRAVENOUS
  Administered 2016-05-15: 30 ug/kg/min via INTRAVENOUS
  Administered 2016-05-15: 20 ug/kg/min via INTRAVENOUS
  Administered 2016-05-15: 30 ug/kg/min via INTRAVENOUS
  Administered 2016-05-15: 20 ug/kg/min via INTRAVENOUS
  Administered 2016-05-16: 35 ug/kg/min via INTRAVENOUS
  Administered 2016-05-16 (×2): 45 ug/kg/min via INTRAVENOUS
  Administered 2016-05-16 (×2): 30 ug/kg/min via INTRAVENOUS
  Administered 2016-05-16 (×3): 40 ug/kg/min via INTRAVENOUS
  Administered 2016-05-16: 30 ug/kg/min via INTRAVENOUS
  Administered 2016-05-16: 45 ug/kg/min via INTRAVENOUS
  Administered 2016-05-17: 35 ug/kg/min via INTRAVENOUS
  Administered 2016-05-17: 45 ug/kg/min via INTRAVENOUS
  Administered 2016-05-17: 60 ug/kg/min via INTRAVENOUS
  Administered 2016-05-17 (×4): 35 ug/kg/min via INTRAVENOUS
  Administered 2016-05-17: 30 ug/kg/min via INTRAVENOUS
  Administered 2016-05-18 (×2): 50 ug/kg/min via INTRAVENOUS
  Administered 2016-05-18 (×2): 60 ug/kg/min via INTRAVENOUS
  Administered 2016-05-18 (×2): 50 ug/kg/min via INTRAVENOUS
  Administered 2016-05-18 (×2): 45 ug/kg/min via INTRAVENOUS
  Administered 2016-05-18: 50 ug/kg/min via INTRAVENOUS
  Administered 2016-05-18: 40 ug/kg/min via INTRAVENOUS
  Administered 2016-05-18: 45 ug/kg/min via INTRAVENOUS
  Administered 2016-05-19 (×3): 30 ug/kg/min via INTRAVENOUS
  Administered 2016-05-19 (×2): 40 ug/kg/min via INTRAVENOUS
  Administered 2016-05-19 (×2): 30 ug/kg/min via INTRAVENOUS
  Administered 2016-05-19: 40 ug/kg/min via INTRAVENOUS
  Administered 2016-05-20 (×8): 30 ug/kg/min via INTRAVENOUS
  Administered 2016-05-21: 40 ug/kg/min via INTRAVENOUS
  Administered 2016-05-21 (×3): 30 ug/kg/min via INTRAVENOUS
  Administered 2016-05-21 (×4): 40 ug/kg/min via INTRAVENOUS
  Administered 2016-05-21: 30 ug/kg/min via INTRAVENOUS
  Administered 2016-05-21: 40 ug/kg/min via INTRAVENOUS
  Administered 2016-05-22: 30 ug/kg/min via INTRAVENOUS
  Administered 2016-05-22: 34.991 ug/kg/min via INTRAVENOUS
  Administered 2016-05-22: 50 ug/kg/min via INTRAVENOUS
  Administered 2016-05-22 (×2): 35 ug/kg/min via INTRAVENOUS
  Administered 2016-05-22: 40 ug/kg/min via INTRAVENOUS
  Administered 2016-05-22: 50 ug/kg/min via INTRAVENOUS
  Administered 2016-05-22: 30 ug/kg/min via INTRAVENOUS
  Administered 2016-05-22 (×2): 40 ug/kg/min via INTRAVENOUS
  Administered 2016-05-22: 35 ug/kg/min via INTRAVENOUS
  Administered 2016-05-23: 30 ug/kg/min via INTRAVENOUS
  Administered 2016-05-23: 40 ug/kg/min via INTRAVENOUS
  Administered 2016-05-23 (×2): 45 ug/kg/min via INTRAVENOUS
  Administered 2016-05-23: 30 ug/kg/min via INTRAVENOUS
  Administered 2016-05-23: 45 ug/kg/min via INTRAVENOUS
  Administered 2016-05-23: 50 ug/kg/min via INTRAVENOUS
  Administered 2016-05-23: 60 ug/kg/min via INTRAVENOUS
  Administered 2016-05-23: 40 ug/kg/min via INTRAVENOUS
  Administered 2016-05-23: 30 ug/kg/min via INTRAVENOUS
  Administered 2016-05-23: 40 ug/kg/min via INTRAVENOUS
  Administered 2016-05-23 – 2016-05-24 (×4): 45 ug/kg/min via INTRAVENOUS
  Administered 2016-05-24: 25 ug/kg/min via INTRAVENOUS
  Filled 2016-05-15 (×49): qty 100
  Filled 2016-05-15: qty 200
  Filled 2016-05-15 (×6): qty 100
  Filled 2016-05-15: qty 300
  Filled 2016-05-15 (×8): qty 100
  Filled 2016-05-15 (×2): qty 200
  Filled 2016-05-15 (×7): qty 100
  Filled 2016-05-15: qty 200
  Filled 2016-05-15 (×10): qty 100
  Filled 2016-05-15: qty 200

## 2016-05-15 MED ORDER — SODIUM CHLORIDE 0.9 % IV SOLN
25.0000 ug/h | INTRAVENOUS | Status: DC
  Administered 2016-05-15: 50 ug/h via INTRAVENOUS
  Filled 2016-05-15: qty 50

## 2016-05-15 MED ORDER — METHYLPREDNISOLONE SODIUM SUCC 125 MG IJ SOLR
60.0000 mg | Freq: Two times a day (BID) | INTRAMUSCULAR | Status: DC
  Administered 2016-05-15 – 2016-05-17 (×5): 60 mg via INTRAVENOUS
  Filled 2016-05-15 (×2): qty 2
  Filled 2016-05-15 (×4): qty 0.96

## 2016-05-15 MED ORDER — ORAL CARE MOUTH RINSE
15.0000 mL | Freq: Four times a day (QID) | OROMUCOSAL | Status: DC
  Administered 2016-05-15 – 2016-05-28 (×52): 15 mL via OROMUCOSAL

## 2016-05-15 MED ORDER — VANCOMYCIN HCL 10 G IV SOLR
1500.0000 mg | Freq: Two times a day (BID) | INTRAVENOUS | Status: DC
  Administered 2016-05-15 – 2016-05-17 (×4): 1500 mg via INTRAVENOUS
  Filled 2016-05-15 (×4): qty 1500

## 2016-05-15 MED ORDER — IPRATROPIUM-ALBUTEROL 0.5-2.5 (3) MG/3ML IN SOLN: 3.0000 mL | Freq: Two times a day (BID) | RESPIRATORY_TRACT | Status: DC | PRN

## 2016-05-15 MED ORDER — FENTANYL 2500MCG IN NS 250ML (10MCG/ML) PREMIX INFUSION
25.0000 ug/h | INTRAVENOUS | Status: DC
  Administered 2016-05-15 – 2016-05-16 (×3): 150 ug/h via INTRAVENOUS
  Administered 2016-05-17: 200 ug/h via INTRAVENOUS
  Administered 2016-05-17 – 2016-05-18 (×2): 300 ug/h via INTRAVENOUS
  Administered 2016-05-18: 200 ug/h via INTRAVENOUS
  Administered 2016-05-19: 150 ug/h via INTRAVENOUS
  Administered 2016-05-19: 200 ug/h via INTRAVENOUS
  Administered 2016-05-20: 150 ug/h via INTRAVENOUS
  Administered 2016-05-21: 175 ug/h via INTRAVENOUS
  Administered 2016-05-21: 150 ug/h via INTRAVENOUS
  Administered 2016-05-22: 175 ug/h via INTRAVENOUS
  Administered 2016-05-22 – 2016-05-23 (×2): 200 ug/h via INTRAVENOUS
  Administered 2016-05-23: 150 ug/h via INTRAVENOUS
  Administered 2016-05-24 (×2): 200 ug/h via INTRAVENOUS
  Administered 2016-05-25 (×2): 250 ug/h via INTRAVENOUS
  Administered 2016-05-26: 70 ug/h via INTRAVENOUS
  Filled 2016-05-15 (×23): qty 250

## 2016-05-15 MED ORDER — ENOXAPARIN SODIUM 40 MG/0.4ML ~~LOC~~ SOLN
40.0000 mg | SUBCUTANEOUS | Status: DC
  Administered 2016-05-15: 40 mg via SUBCUTANEOUS
  Filled 2016-05-15 (×2): qty 0.4

## 2016-05-15 MED ORDER — MAGNESIUM SULFATE 2 GM/50ML IV SOLN
2.0000 g | Freq: Once | INTRAVENOUS | Status: AC
  Administered 2016-05-15: 2 g via INTRAVENOUS
  Filled 2016-05-15: qty 50

## 2016-05-15 MED ORDER — FUROSEMIDE 10 MG/ML IJ SOLN
40.0000 mg | Freq: Once | INTRAMUSCULAR | Status: AC
  Administered 2016-05-15: 40 mg via INTRAVENOUS
  Filled 2016-05-15: qty 4

## 2016-05-15 MED ORDER — INSULIN ASPART 100 UNIT/ML ~~LOC~~ SOLN
0.0000 [IU] | SUBCUTANEOUS | Status: DC
  Administered 2016-05-15: 11 [IU] via SUBCUTANEOUS
  Administered 2016-05-15: 15 [IU] via SUBCUTANEOUS
  Administered 2016-05-15: 11 [IU] via SUBCUTANEOUS
  Administered 2016-05-15: 8 [IU] via SUBCUTANEOUS
  Administered 2016-05-15 – 2016-05-16 (×2): 11 [IU] via SUBCUTANEOUS
  Administered 2016-05-16: 8 [IU] via SUBCUTANEOUS

## 2016-05-15 MED ORDER — ASPIRIN 300 MG RE SUPP: 300.0000 mg | RECTAL | Status: AC

## 2016-05-15 MED ORDER — FUROSEMIDE 10 MG/ML IJ SOLN
80.0000 mg | Freq: Three times a day (TID) | INTRAMUSCULAR | Status: DC
  Administered 2016-05-15 – 2016-05-19 (×13): 80 mg via INTRAVENOUS
  Filled 2016-05-15 (×14): qty 8

## 2016-05-15 NOTE — Progress Notes (Addendum)
Vent changes made per ABG results. Pt placed on 460(6cc's)/30/+5/60%. MD aware. RT will continue to monitor.

## 2016-05-15 NOTE — Progress Notes (Signed)
Critical ABG results called to RN. HcO3 not showing on ABG results. MD aware.

## 2016-05-15 NOTE — H&P (Signed)
PULMONARY / CRITICAL CARE MEDICINE   Name: Brett Curry MRN: [id number] DOB: [birth year]    ADMISSION DATE:  05/02/2016 CONSULTATION DATE:  04/21/2016  REFERRING MD:  Dr. Manus Gunning, EDP  CHIEF COMPLAINT:  resp distress  HISTORY OF PRESENT ILLNESS:   56 yo male with hx of asthma, AFib, morbid obesity, COPD, HLD, uncontrolled DM II, presented via EMS to [facility]  ED with complaint of resp distress for few days. His mental status worsened during transport, given 2 nebs and solumedrol by EMS. He was in Afib RVR in EMS and had wheezing. He is on continues O2 at home for COPD. He is not on anticoag for his Afib.  He was in extremis upon arrival to ED with resp distress, obtunded and unable to provide hx. Initially O2 sat 83%, improved to 99% with 99%. Intubated on arrival due to hypercarbic resp failure and AMS. Afebrile on presentation, BP ok. ABG showed resp acidosis with pH 7.118, CO2 above reportable range for lab, PO2 178.  CXR showed intestinal edema and large R pleural effusion. Received 40 IV lasix, Mag, cardizem gtt for afib rvr.   PAST MEDICAL HISTORY :  He  has a past medical history of Asthma; Atrial fibrillation (HCC); COPD (chronic obstructive pulmonary disease) (HCC); Hyperlipidemia; Morbid obesity due to excess calories (HCC) (07/27/2014); and Type II or unspecified type diabetes mellitus without mention of complication, not stated as uncontrolled.  PAST SURGICAL HISTORY: He  has a past surgical history that includes Shoulder surgery; Knee surgery; Tonsillectomy; Cardiac catheterization (N/A, 07/09/2015); and Eye surgery.  No Known Allergies  No current facility-administered medications on file prior to encounter.    Current Outpatient Prescriptions on File Prior to Encounter  Medication Sig  . aspirin EC 81 MG EC tablet Take 1 tablet (81 mg total) by mouth daily.  Marland Kitchen diltiazem (CARDIZEM CD) 180 MG 24 hr capsule Take 180 mg by mouth daily.   . Fluticasone-Salmeterol (ADVAIR) 250-50  MCG/DOSE AEPB Inhale 1 puff into the lungs 2 (two) times daily.  Marland Kitchen glimepiride (AMARYL) 4 MG tablet Take 4 mg by mouth daily before breakfast.  . insulin detemir (LEVEMIR) 100 UNIT/ML injection Inject 0.6 mLs (60 Units total) into the skin at bedtime.  Marland Kitchen ipratropium-albuterol (DUONEB) 0.5-2.5 (3) MG/3ML SOLN Take 3 mLs by nebulization 2 (two) times daily as needed (for wheezing or shortness of breath).   . metFORMIN (GLUCOPHAGE) 1000 MG tablet Take 1,000 mg by mouth at bedtime.   . Omega-3 Fatty Acids (FISH OIL) 1200 MG CAPS Take 1,200 mg by mouth 2 (two) times daily.  . OXYGEN Inhale 5 L into the lungs.  . pioglitazone (ACTOS) 30 MG tablet Take 30 mg by mouth daily.   . pravastatin (PRAVACHOL) 40 MG tablet 40 mg at bedtime.  . predniSONE (DELTASONE) 20 MG tablet Take 2 tablets (40 mg total) by mouth daily with breakfast.  . SitaGLIPtin-MetFORMIN HCl [phone] MG TB24 Take 1 tablet by mouth every morning.  . traMADol (ULTRAM) 50 MG tablet Take 50 mg by mouth at bedtime.    FAMILY HISTORY:  His indicated that the status of his mother is unknown. He indicated that the status of his sister is unknown.    SOCIAL HISTORY: He  reports that he quit smoking about 13 months ago. His smoking use included Cigarettes. He has a 68.00 pack-year smoking history. He has never used smokeless tobacco. He reports that he does not drink alcohol or use drugs.  REVIEW OF SYSTEMS:  Unable to provide  SUBJECTIVE:  Unable to provide. Intubated.   VITAL SIGNS: BP 127/71   Pulse (!) 114   Temp 97.9 F (36.6 C)   Resp (!) 30   Ht  (1.753 m)   Wt (!) 439 lb (199.1 kg)   SpO2 98%   BMI 64.83 kg/m   HEMODYNAMICS:    VENTILATOR SETTINGS: Vent Mode: PRVC FiO2 (%):  [60 %-100 %] 80 % Set Rate:  [22 bmp-30 bmp] 30 bmp Vt Set:  [460 mL-500 mL] 500 mL PEEP:  [5 cmH20-10 cmH20] 10 cmH20 Plateau Pressure:  [20 cmH20-23 cmH20] 20 cmH20  INTAKE / OUTPUT: No intake/output data recorded.  PHYSICAL  EXAMINATION: General:  Morbidly obese male, intubated, sedated Neuro:  Intubated, sedated HEENT:  [place]/at, perrla Cardiovascular:  Tachycardic, irregular.   Lungs:  Decreased BS b/l, mild exp wheezing.  Abdomen:  Obese, soft, normoactive BS Musculoskeletal:  Chronic venous stasis skin changes on both legs, 2+ pitting edema upto knees b/l. Legs cold to touch. Distal pulses not palpated. Detected with doppler but weak signal.  onycho mycotic toe nail changes on all toes.  Skin:  Groin has moist,erythematous areas. Dry flaky skin throughout.   LABS:  BMET  Recent Labs Lab 04/21/2016 0539  NA 135  K 6.0*  CL 87*  CO2 43*  BUN 14  CREATININE 0.66  GLUCOSE 338*    Electrolytes  Recent Labs Lab 04/18/2016 0539  CALCIUM 9.0    CBC  Recent Labs Lab 05/12/2016 0539  WBC 13.0*  HGB 13.9  HCT 47.8  PLT 200    Coag's No results for input(s): APTT, INR in the last 168 hours.  Sepsis Markers  Recent Labs Lab 04/30/2016 0554  LATICACIDVEN 0.86    ABG  Recent Labs Lab 05/06/2016 0620  PHART 7.118*  PCO2ART CRITICAL RESULT CALLED TO, READ BACK BY AND VERIFIED WITH:  PO2ART 178*    Liver Enzymes No results for input(s): AST, ALT, ALKPHOS, BILITOT, ALBUMIN in the last 168 hours.  Cardiac Enzymes  Recent Labs Lab 04/24/2016 0539  TROPONINI <0.03    Glucose  Recent Labs Lab 05/03/2016 0828  GLUCAP 312*    Imaging Dg Chest Portable 1 View  Result Date: 05/12/2016 CLINICAL DATA:  Respiratory distress, endotracheal tube placement. EXAM: PORTABLE CHEST 1 VIEW COMPARISON:  Chest radiograph February 25, 2016 FINDINGS: Large body habitus limits evaluation. Endotracheal tube tip projects 1.9 cm above the carina. Naso gastric tube in place, distal tip difficult to visualize. The cardiac silhouette appears moderately enlarged. Low inspiratory examination with moderate to large RIGHT pleural effusion and pulmonary vascular congestion/interstitial edema. No pneumothorax.  Resected distal LEFT clavicle. IMPRESSION: Habitus limited examination. Endotracheal tube projects 1.9 cm above the carina, nasogastric tube distal aspect not well characterized. Moderate to severe cardiomegaly and pulmonary interstitial edema. Moderate to large layering RIGHT pleural effusion. Electronically Signed   By: Awilda Metro M.D.   On: 04/27/2016 06:22     STUDIES:  CXR 4/29 >> interstitial edema, cardiomegaly, R pleural effusion  CULTURES: BCX 4/29 >> Sputum cx 4/29 >>  ANTIBIOTICS: None.  SIGNIFICANT EVENTS: 4/29 admitted for hypercarbic resp failure.   LINES/TUBES: PIV ETT 4/29 >> NG tube Foley  DISCUSSION: 56 yo male with multiple co morbidities including COPD, AFib RVR, morbid obesity, on home O2, presented with resp distress and AMS with severe hypercarbia. Intubated.  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 05/2015. R pleural effusion P:   On full vent support now FIO2 60%, PEEP 10, RR 30. s/p lasix  iv. Repeat ABG now and tomorrow, repeat CXR tomorrow.  Sputum cx, PCT ordered. Hold abx for now as he is not having any fevers. WBC could be from steroid use recently.  Will do Solumedrol 60 mg q12hr, duoneb, dulera.   CARDIOVASCULAR A:  Afib RVR - not on anticoag Morbid obesity HLD BNP mildly elevated 119. Trop negative. s/p lasix  IV x1. EKG showed no ischemic changes. P:   Hold further lasix for now, his weight is not much different from last few admissions.  Cont dilt gtt.   RENAL A:   crt stable, Hyperkalemia 5.6 P:   Monitor K+, hopefully it will come down with lasix dose given this AM. Repeat K in afternoon.   GASTROINTESTINAL A:   No active issues P:   PPI  HEMATOLOGIC A:   Leukocytosis 13 - could be from prednisone use recently.  P:  Monitor for signs of infection/ f/up cultures.   INFECTIOUS A:   No active sources of  infection Could have pneumonia, al though CXR is showing pleural effusion not infiltrates P:  f/up cultures. f/up PCT. Hold abx for now.   ENDOCRINE A:   Diabetes II uncontrolled P:   lantus reduced from home dose 40 + SSI  NEUROLOGIC A:   AMS likely from hypercarbia  Agitated due to intubation  P:   RASS goal: -1, fent + propofol.   Hyacinth Meeker, M.D.

## 2016-05-15 NOTE — ED Notes (Signed)
Dentures and medication taken home by wife.

## 2016-05-15 NOTE — ED Notes (Signed)
02 sats 95% after RT suctioned pt.

## 2016-05-15 NOTE — ED Notes (Signed)
RT, Toniann Fail at bedside.

## 2016-05-15 NOTE — Progress Notes (Signed)
Pharmacy Antibiotic Note  Brett Curry is a 56 y.o. male admitted on Jun 10, 2016 with pneumonia.  Pharmacy has been consulted for vancomycin/cefepime dosing.  Plan: - Vancomycin 2500 mg IV x1 loading dose - Vancomycin 1500 mg IV q12h - Cefepime 1g IV q8h - Monitor C&S, CBC and clinical progression - Monitor vancomycin trough at steady state as indicated  Height:  (175.3 cm) Weight: (!) 439 lb (199.1 kg) IBW/kg (Calculated) : 70.7  Temp (24hrs), Avg:97.7 F (36.5 C), Min:97.5 F (36.4 C), Max:97.9 F (36.6 C)   Recent Labs Lab 06/10/2016 0539 2016/06/10 0554  WBC 13.0*  --   CREATININE 0.66  --   LATICACIDVEN  --  0.86    Estimated Creatinine Clearance: 178.1 mL/min (by C-G formula based on SCr of 0.66 mg/dL).    No Known Allergies  Antimicrobials this admission: Vancomycin 4/29 >> Cefepime 4/29 >>  Microbiology results: 4/29 MRSA PCR:  4/29 Resp panel:  4/29 BCx:  4/29 Resp Cx:   Thank you for allowing pharmacy to be a part of this patient's care.  Casilda Carls, PharmD, BCPS PGY-2 Infectious Diseases Pharmacy Resident Pager: [phone] Jun 10, 2016 10:35 AM

## 2016-05-15 NOTE — ED Notes (Signed)
Date and time results received: 04/17/2016 0638 (use smartphrase ".now" to insert current time)  Test: ABG Critical Value: Ph 7.11 co2 >120 Po2 178  Name of Provider Notified: Rancour  Orders Received? Or Actions Taken?: notified

## 2016-05-15 NOTE — ED Notes (Signed)
RT called due to 02 sats 82%.  Dr. Manus Gunning notified and at bedside adjusting vent settings.

## 2016-05-15 NOTE — ED Notes (Signed)
Marcelino Duster, RN 40M given update on pt.

## 2016-05-15 NOTE — ED Triage Notes (Signed)
Pt arrived by EMS from home respiratory distress.

## 2016-05-15 NOTE — ED Provider Notes (Signed)
[place] DEPT Provider Note   CSN: [id number] Arrival date & time: 05/06/2016  0522     History   Chief Complaint Chief Complaint  Patient presents with  . Respiratory Distress    HPI Brett Curry is a 56 y.o. male.  Level V caveat for respiratory distress. Patient arrives in extremis by EMS. EMS was called for respiratory distress over several days' duration. Mental status has progressively worsened throughout transport. Patient was given 2 nebs andsolu Medrol. EMS reports rapid atrial fibrillation and wheezing throughout. Patient does have history of COPD and is on continuous oxygen at home. Also has history of atrial fibrillation but is not anticoagulated. Unknown fever or cough. Patient unable to give a history and unable to answer questions.   The history is provided by the EMS personnel. The history is limited by the condition of the patient.    Past Medical History:  Diagnosis Date  . Asthma   . Atrial fibrillation ([place])   . COPD (chronic obstructive pulmonary disease) ([place])   . Hyperlipidemia   . Morbid obesity due to excess calories ([place]) 07/27/2014  . Type II or unspecified type diabetes mellitus without mention of complication, not stated as uncontrolled     Patient Active Problem List   Diagnosis Date Noted  . DOE (dyspnea on exertion)   . Paroxysmal atrial fibrillation ([place]) 07/08/2015  . Abnormal nuclear stress test 07/08/2015  . Chronic respiratory failure ([place]) 06/16/2015  . COPD (chronic obstructive pulmonary disease) ([place]) 05/12/2015  . Obesity hypoventilation syndrome ([place]) 05/12/2015  . Morbid obesity ([place]) 05/12/2015  . Pre-operative clearance 05/12/2015  . COPD with acute exacerbation ([place]) 04/04/2015  . Hyperkalemia 04/04/2015  . Morbid obesity due to excess calories ([place]) 07/27/2014  . Acute respiratory failure with hypoxia ([place]) 09/19/2012  . COPD exacerbation ([place]) 09/19/2012  . Obstructive sleep apnea 09/11/2009  . Poorly controlled  type 2 diabetes mellitus ([place]) 09/10/2009  . HYPERLIPIDEMIA 09/10/2009    Past Surgical History:  Procedure Laterality Date  . CARDIAC CATHETERIZATION N/A 07/09/2015   Procedure: Left Heart Cath and Coronary Angiography;  Surgeon: Leonie Man, MD;  Location: [facility];  Service: Cardiovascular;  Laterality: N/A;  . EYE SURGERY    . KNEE SURGERY     right  . SHOULDER SURGERY     right  . TONSILLECTOMY         Home Medications    Prior to Admission medications   Medication Sig Start Date End Date Taking? Authorizing Provider  aspirin EC 81 MG EC tablet Take 1 tablet (81 mg total) by mouth daily. 02/27/16   Asencion Noble, MD  diltiazem (CARDIZEM CD) 180 MG 24 hr capsule Take 180 mg by mouth daily.  06/30/15   Historical Provider, MD  Fluticasone-Salmeterol (ADVAIR) 250-50 MCG/DOSE AEPB Inhale 1 puff into the lungs 2 (two) times daily. 12/15/15   Chesley Mires, MD  glimepiride (AMARYL) 4 MG tablet Take 4 mg by mouth daily before breakfast.    Historical Provider, MD  insulin detemir (LEVEMIR) 100 UNIT/ML injection Inject 0.6 mLs (60 Units total) into the skin at bedtime. 02/27/16   Asencion Noble, MD  ipratropium-albuterol (DUONEB) 0.5-2.5 (3) MG/3ML SOLN Take 3 mLs by nebulization 2 (two) times daily as needed (for wheezing or shortness of breath).     Historical Provider, MD  metFORMIN (GLUCOPHAGE) 1000 MG tablet Take 1,000 mg by mouth at bedtime.     Historical Provider, MD  Omega-3 Fatty Acids (FISH OIL) 1200  MG CAPS Take 1,200 mg by mouth 2 (two) times daily.    Historical Provider, MD  OXYGEN Inhale 5 L into the lungs.    Historical Provider, MD  pioglitazone (ACTOS) 30 MG tablet Take 30 mg by mouth daily.  06/22/15   Historical Provider, MD  pravastatin (PRAVACHOL) 40 MG tablet 40 mg at bedtime. 06/30/15   Historical Provider, MD  predniSONE (DELTASONE) 20 MG tablet Take 2 tablets (40 mg total) by mouth daily with breakfast. 02/27/16   Asencion Noble, MD  SitaGLIPtin-MetFORMIN HCl [phone]  MG TB24 Take 1 tablet by mouth every morning.    Historical Provider, MD  traMADol (ULTRAM) 50 MG tablet Take 50 mg by mouth at bedtime. 04/14/15   Historical Provider, MD    Family History Family History  Problem Relation Age of Onset  . Asthma Sister   . Heart attack Mother     Social History Social History  Substance Use Topics  . Smoking status: Former Smoker    Packs/day: 2.00    Years: 34.00    Types: Cigarettes    Quit date: 03/28/2015  . Smokeless tobacco: Never Used     Comment: started at age 63.   Marland Kitchen Alcohol use No     Allergies   Patient has no known allergies.   Review of Systems Review of Systems  Unable to perform ROS: Severe respiratory distress     Physical Exam Updated Vital Signs BP 129/90 (BP Location: Right Arm)   Pulse (!) 128   Wt (!) 439 lb (199.1 kg)   SpO2 99%   BMI 59.54 kg/m   Physical Exam  Constitutional: He appears well-developed and well-nourished. He appears distressed.  Patient is obtunded, agonal respirations, unable to give a history.  HENT:  Head: Normocephalic and atraumatic.  Eyes: EOM are normal.  Neck: Normal range of motion.  Cardiovascular: Normal rate and normal heart sounds.   Irregular rhythm  Pulmonary/Chest: He is in respiratory distress. He has wheezes.  Diminished breath sounds with scattered expiratory wheezing bilaterally  Abdominal: There is no tenderness.  Musculoskeletal: Normal range of motion. He exhibits edema.  Chronic venous stasis changes. +1 edema to knees  Neurological:  Does not follow commands, moves all extremities     ED Treatments / Results  Labs (all labs ordered are listed, but only abnormal results are displayed) Labs Reviewed  CBC WITH DIFFERENTIAL/PLATELET - Abnormal; Notable for the following:       Result Value   WBC 13.0 (*)    MCHC 29.1 (*)    Neutro Abs 10.2 (*)    Monocytes Absolute 1.1 (*)    All other components within normal limits  BASIC METABOLIC PANEL - Abnormal;  Notable for the following:    Potassium 6.0 (*)    Chloride 87 (*)    CO2 43 (*)    Glucose, Bld 338 (*)    All other components within normal limits  BRAIN NATRIURETIC PEPTIDE - Abnormal; Notable for the following:    B Natriuretic Peptide 119.0 (*)    All other components within normal limits  BLOOD GAS, ARTERIAL - Abnormal; Notable for the following:    pH, Arterial 7.118 (*)    pO2, Arterial 178 (*)    All other components within normal limits  TROPONIN I  I-STAT CG4 LACTIC ACID, ED  Randolm Idol, ED    EKG  EKG Interpretation  Date/Time:  Sunday May 15 2016 05:41:14 EDT Ventricular Rate:  101 PR Interval:  QRS Duration: 101 QT Interval:  315 QTC Calculation: 409 R Axis:   82 Text Interpretation:  Atrial fibrillation Low voltage, precordial leads No significant change was found Confirmed by Wyvonnia Dusky  MD, Runette Scifres [phone]) on 05/02/2016 5:44:18 AM       Radiology Dg Chest Portable 1 View  Result Date: 04/17/2016 CLINICAL DATA:  Respiratory distress, endotracheal tube placement. EXAM: PORTABLE CHEST 1 VIEW COMPARISON:  Chest radiograph February 25, 2016 FINDINGS: Large body habitus limits evaluation. Endotracheal tube tip projects 1.9 cm above the carina. Naso gastric tube in place, distal tip difficult to visualize. The cardiac silhouette appears moderately enlarged. Low inspiratory examination with moderate to large RIGHT pleural effusion and pulmonary vascular congestion/interstitial edema. No pneumothorax. Resected distal LEFT clavicle. IMPRESSION: Habitus limited examination. Endotracheal tube projects 1.9 cm above the carina, nasogastric tube distal aspect not well characterized. Moderate to severe cardiomegaly and pulmonary interstitial edema. Moderate to large layering RIGHT pleural effusion. Electronically Signed   By: Elon Alas M.D.   On: 04/28/2016 06:22    Procedures Procedure Name: Intubation Date/Time: 05/10/2016 5:47 AM Performed by: Ezequiel Essex Pre-anesthesia Checklist: Patient identified, Emergency Drugs available, Suction available, Patient being monitored and Timeout performed Oxygen Delivery Method: Ambu bag Preoxygenation: Pre-oxygenation with 100% oxygen Intubation Type: IV induction and Rapid sequence Ventilation: Mask ventilation without difficulty Laryngoscope Size: Mac, 3 and Glidescope Grade View: Grade II Tube type: Subglottic suction tube Tube size: 8.0 mm Number of attempts: 1 Airway Equipment and Method: Bougie stylet and Rigid stylet Placement Confirmation: ETT inserted through vocal cords under direct vision,  Breath sounds checked- equal and bilateral and Positive ETCO2 Secured at: 26 cm Tube secured with: ETT holder Dental Injury: Teeth and Oropharynx as per pre-operative assessment  Difficulty Due To: Difficult Airway- due to large tongue, Difficult Airway- due to reduced neck mobility and Difficulty was anticipated Future Recommendations: Recommend- induction with short-acting agent, and alternative techniques readily available      (including critical care time)  Medications Ordered in ED Medications  ipratropium-albuterol (DUONEB) 0.5-2.5 (3) MG/3ML nebulizer solution 3 mL (not administered)  magnesium sulfate IVPB 2 g 50 mL (not administered)  fentaNYL (SUBLIMAZE) injection 50 mcg (not administered)  fentaNYL (SUBLIMAZE) 2,500 mcg in sodium chloride 0.9 % 250 mL (10 mcg/mL) infusion (not administered)  fentaNYL (SUBLIMAZE) bolus via infusion 50 mcg (not administered)  midazolam (VERSED) injection 2 mg (not administered)  midazolam (VERSED) injection 2 mg (not administered)     Initial Impression / Assessment and Plan / ED Course  I have reviewed the triage vital signs and the nursing notes.  Pertinent labs & imaging results that were available during my care of the patient were reviewed by me and considered in my medical decision making (see chart for details).    Patient arrives in  extremis with respiratory distress, he is obtunded and unable to give a history and noncommunicative. Initial oxygenation was 83% on nebulizer. Patient was given a nonrebreather and oxygenation increased to 99%. He was intubated on arrival as above.  Patient given nebulizers and steroids. EKG shows atrial fibrillation with RVR. Patient unable to give any history.  X-ray shows interstitial edema and right-sided pleural effusion. ABG with severe CO2 retention and respiratory acidosis.  Patient will be continued on mechanical ventilation, treatment for COPD exacerbation. No ICU beds available. Admission to [facility] discussed with Dr. Jimmy Footman.   Vent settings adjusted. RR increased, TV decreased, fiO2 decreased.  K elevated, suspect hemolyzed. EKG without changes. Cr normal.  Repeat pending.  Patient with improved sedation and synchrony with ventilator.  Awaiting transport to [facility] ICU.  Wife updated at bedside.  CRITICAL CARE Performed by: Ezequiel Essex Total critical care time: 60 minutes Critical care time was exclusive of separately billable procedures and treating other patients. Critical care was necessary to treat or prevent imminent or life-threatening deterioration. Critical care was time spent personally by me on the following activities: development of treatment plan with patient and/or surrogate as well as nursing, discussions with consultants, evaluation of patient's response to treatment, examination of patient, obtaining history from patient or surrogate, ordering and performing treatments and interventions, ordering and review of laboratory studies, ordering and review of radiographic studies, pulse oximetry and re-evaluation of patient's condition.   Final Clinical Impressions(s) / ED Diagnoses   Final diagnoses:  Acute respiratory failure with hypercapnia (HCC)  COPD exacerbation (HCC)  Pleural effusion    New Prescriptions New Prescriptions   No medications on file      Ezequiel Essex, MD 05/02/2016 [phone]

## 2016-05-15 NOTE — Progress Notes (Signed)
Call from [facility]  - going to leave APER to 4m13 at [facility]  - diprivan order given  Dr. Kalman Shan, M.D., [facility]C.P Pulmonary and Critical Care Medicine Staff Physician [facility] [place] [facility]monary [facility] Critical Care Pager: [phone], If no answer or between  15:00h - 7:00h: call [phone]  05/16/2016 8:12 AM

## 2016-05-15 NOTE — ED Notes (Signed)
[facility] with pt at this time, given paperwork including: EMTALA, [facility]/transfer, medical necessity, facesheet, e-signature form.  [facility] received order from their hospitalist to initiate propofol.  Propofol pulled from ED pyxis and given to [facility]. [facility] verbalized that they would do documentation for propofol administration.

## 2016-05-16 ENCOUNTER — Inpatient Hospital Stay (HOSPITAL_COMMUNITY): Payer: Medicare Other

## 2016-05-16 DIAGNOSIS — J810 Acute pulmonary edema: Secondary | ICD-10-CM

## 2016-05-16 DIAGNOSIS — M7989 Other specified soft tissue disorders: Secondary | ICD-10-CM

## 2016-05-16 DIAGNOSIS — J441 Chronic obstructive pulmonary disease with (acute) exacerbation: Secondary | ICD-10-CM

## 2016-05-16 LAB — BLOOD GAS, ARTERIAL
Acid-Base Excess: 16.8 mmol/L — ABNORMAL HIGH (ref 0.0–2.0)
Acid-Base Excess: 16.8 mmol/L — ABNORMAL HIGH (ref 0.0–2.0)
Bicarbonate: 41.1 mmol/L — ABNORMAL HIGH (ref 20.0–28.0)
Bicarbonate: 42.2 mmol/L — ABNORMAL HIGH (ref 20.0–28.0)
DRAWN BY: 280981
Drawn by: 345601
FIO2: 50
FIO2: 50
MECHVT: 500 mL
O2 SAT: 93.9 %
O2 Saturation: 93.1 %
PEEP: 10 cmH2O
PEEP: 10 cmH2O
PH ART: 7.545 — AB (ref 7.350–7.450)
PO2 ART: 71.1 mmHg — AB (ref 83.0–108.0)
Patient temperature: 98.6
Patient temperature: 98.6
RATE: 30 resp/min
RATE: 20 resp/min
VT: 500 mL
pCO2 arterial: 47.7 mmHg (ref 32.0–48.0)
pCO2 arterial: 64.6 mmHg — ABNORMAL HIGH (ref 32.0–48.0)
pH, Arterial: 7.431 (ref 7.350–7.450)
pO2, Arterial: 63.4 mmHg — ABNORMAL LOW (ref 83.0–108.0)

## 2016-05-16 LAB — BASIC METABOLIC PANEL WITH GFR
Anion gap: 10 (ref 5–15)
BUN: 18 mg/dL (ref 6–20)
CO2: 38 mmol/L — ABNORMAL HIGH (ref 22–32)
Calcium: 9.3 mg/dL (ref 8.9–10.3)
Chloride: 88 mmol/L — ABNORMAL LOW (ref 101–111)
Creatinine, Ser: 0.81 mg/dL (ref 0.61–1.24)
GFR calc Af Amer: >60 mL/min
GFR calc non Af Amer: >60 mL/min
Glucose, Bld: 252 mg/dL — ABNORMAL HIGH (ref 65–99)
Potassium: 3.8 mmol/L (ref 3.5–5.1)
Sodium: 136 mmol/L (ref 135–145)

## 2016-05-16 LAB — GLUCOSE, CAPILLARY
GLUCOSE-CAPILLARY: 306 mg/dL — AB (ref 65–99)
GLUCOSE-CAPILLARY: 328 mg/dL — AB (ref 65–99)
GLUCOSE-CAPILLARY: 354 mg/dL — AB (ref 65–99)
GLUCOSE-CAPILLARY: 303 mg/dL — AB (ref 65–99)
Glucose-Capillary: 273 mg/dL — ABNORMAL HIGH (ref 65–99)
Glucose-Capillary: 306 mg/dL — ABNORMAL HIGH (ref 65–99)
Glucose-Capillary: 330 mg/dL — ABNORMAL HIGH (ref 65–99)
Glucose-Capillary: 347 mg/dL — ABNORMAL HIGH (ref 65–99)
Glucose-Capillary: 366 mg/dL — ABNORMAL HIGH (ref 65–99)
Glucose-Capillary: 296 mg/dL — ABNORMAL HIGH (ref 65–99)
Glucose-Capillary: 252 mg/dL — ABNORMAL HIGH (ref 65–99)

## 2016-05-16 LAB — HIV ANTIBODY (ROUTINE TESTING W REFLEX): HIV Screen 4th Generation wRfx: NONREACTIVE

## 2016-05-16 LAB — BASIC METABOLIC PANEL
ANION GAP: 11 (ref 5–15)
BUN: 26 mg/dL — AB (ref 6–20)
CALCIUM: 8.8 mg/dL — AB (ref 8.9–10.3)
CO2: 36 mmol/L — ABNORMAL HIGH (ref 22–32)
Chloride: 88 mmol/L — ABNORMAL LOW (ref 101–111)
Creatinine, Ser: 0.93 mg/dL (ref 0.61–1.24)
GFR calc Af Amer: >60 mL/min (ref >60)
GLUCOSE: 344 mg/dL — AB (ref 65–99)
Potassium: 3.7 mmol/L (ref 3.5–5.1)
Sodium: 135 mmol/L (ref 135–145)

## 2016-05-16 LAB — CBC
HCT: 40 % (ref 39.0–52.0)
Hemoglobin: 12.3 g/dL — ABNORMAL LOW (ref 13.0–17.0)
MCH: 28.7 pg (ref 26.0–34.0)
MCHC: 30.8 g/dL (ref 30.0–36.0)
MCV: 93.2 fL (ref 78.0–100.0)
Platelets: 163 10*3/uL (ref 150–400)
RBC: 4.29 MIL/uL (ref 4.22–5.81)
RDW: 15.6 % — ABNORMAL HIGH (ref 11.5–15.5)
WBC: 12.1 10*3/uL — ABNORMAL HIGH (ref 4.0–10.5)

## 2016-05-16 LAB — MAGNESIUM: Magnesium: 1.7 mg/dL (ref 1.7–2.4)

## 2016-05-16 LAB — PHOSPHORUS: Phosphorus: 2.2 mg/dL — ABNORMAL LOW (ref 2.5–4.6)

## 2016-05-16 LAB — PROCALCITONIN: Procalcitonin: <0.1 ng/mL

## 2016-05-16 MED ORDER — PRO-STAT SUGAR FREE PO LIQD
30.0000 mL | Freq: Two times a day (BID) | ORAL | Status: DC
  Filled 2016-05-16: qty 30

## 2016-05-16 MED ORDER — MAGNESIUM SULFATE 2 GM/50ML IV SOLN
2.0000 g | Freq: Once | INTRAVENOUS | Status: AC
  Administered 2016-05-16: 2 g via INTRAVENOUS
  Filled 2016-05-16: qty 50

## 2016-05-16 MED ORDER — FENTANYL CITRATE (PF) 100 MCG/2ML IJ SOLN
100.0000 ug | Freq: Once | INTRAMUSCULAR | Status: AC
  Administered 2016-05-16: 100 ug via INTRAVENOUS

## 2016-05-16 MED ORDER — SODIUM CHLORIDE 0.9 % IV SOLN
INTRAVENOUS | Status: AC
  Administered 2016-05-16: 2.7 [IU]/h via INTRAVENOUS
  Administered 2016-05-17: 10.6 [IU]/h via INTRAVENOUS
  Administered 2016-05-17: 11.1 [IU]/h via INTRAVENOUS
  Filled 2016-05-16 (×2): qty 2.5

## 2016-05-16 MED ORDER — ENOXAPARIN SODIUM 60 MG/0.6ML ~~LOC~~ SOLN
60.0000 mg | Freq: Once | SUBCUTANEOUS | Status: AC
  Administered 2016-05-16: 15:00:00 60 mg via SUBCUTANEOUS
  Filled 2016-05-16: qty 0.6

## 2016-05-16 MED ORDER — ENOXAPARIN SODIUM 100 MG/ML ~~LOC~~ SOLN
100.0000 mg | SUBCUTANEOUS | Status: DC
  Administered 2016-05-17: 100 mg via SUBCUTANEOUS
  Filled 2016-05-16: qty 1

## 2016-05-16 MED ORDER — VITAL HIGH PROTEIN PO LIQD
1000.0000 mL | ORAL | Status: DC
  Administered 2016-05-16 – 2016-05-19 (×4): 1000 mL
  Administered 2016-05-20 (×3)
  Administered 2016-05-20: 1000 mL
  Administered 2016-05-20 – 2016-05-21 (×10)
  Administered 2016-05-21: 1000 mL
  Administered 2016-05-21 – 2016-05-22 (×16)
  Administered 2016-05-22: 1000 mL
  Administered 2016-05-23 (×3)
  Administered 2016-05-23: 1000 mL
  Administered 2016-05-23 – 2016-05-24 (×8)
  Administered 2016-05-24: 1000 mL
  Administered 2016-05-24 (×6)
  Filled 2016-05-16: qty 1000

## 2016-05-16 MED ORDER — FENTANYL CITRATE (PF) 100 MCG/2ML IJ SOLN
INTRAMUSCULAR | Status: AC
  Administered 2016-05-16: 100 ug via INTRAVENOUS
  Filled 2016-05-16: qty 2

## 2016-05-16 MED ORDER — POTASSIUM PHOSPHATES 15 MMOLE/5ML IV SOLN
15.0000 mmol | Freq: Once | INTRAVENOUS | Status: AC
  Administered 2016-05-16: 15 mmol via INTRAVENOUS
  Filled 2016-05-16: qty 5

## 2016-05-16 MED ORDER — BUDESONIDE 0.5 MG/2ML IN SUSP
0.5000 mg | Freq: Two times a day (BID) | RESPIRATORY_TRACT | Status: DC
  Administered 2016-05-16 – 2016-05-27 (×22): 0.5 mg via RESPIRATORY_TRACT
  Filled 2016-05-16 (×23): qty 2

## 2016-05-16 MED ORDER — INSULIN ASPART 100 UNIT/ML ~~LOC~~ SOLN: 0.0000 [IU] | SUBCUTANEOUS | Status: DC

## 2016-05-16 MED ORDER — PRO-STAT SUGAR FREE PO LIQD
60.0000 mL | Freq: Every day | ORAL | Status: DC
  Administered 2016-05-16 – 2016-05-27 (×50): 60 mL
  Filled 2016-05-16 (×57): qty 60

## 2016-05-16 MED ORDER — FENTANYL CITRATE (PF) 100 MCG/2ML IJ SOLN
INTRAMUSCULAR | Status: AC
  Filled 2016-05-16: qty 2

## 2016-05-16 MED ORDER — VITAL HIGH PROTEIN PO LIQD
1000.0000 mL | ORAL | Status: DC
  Administered 2016-05-16: 1000 mL

## 2016-05-16 MED ORDER — DILTIAZEM HCL 60 MG PO TABS
60.0000 mg | ORAL_TABLET | Freq: Four times a day (QID) | ORAL | Status: DC
  Administered 2016-05-16 – 2016-05-27 (×40): 60 mg via ORAL
  Filled 2016-05-16 (×44): qty 1

## 2016-05-16 NOTE — Progress Notes (Signed)
Initial Nutrition Assessment  DOCUMENTATION CODES:   Morbid obesity  INTERVENTION:    Vital High Protein at 10 ml/h (240 ml per day)  Pro-stat 60 ml 5 times per day  Provides 1240 kcal, 171 gm protein, 201 ml free water daily  Total intake with Propofol and TF will be 2185 kcal, exceeding kcal need to maximize protein provision.  NUTRITION DIAGNOSIS:   Inadequate oral intake related to inability to eat as evidenced by NPO status.  GOAL:   Provide needs based on ASPEN/SCCM guidelines  MONITOR:   Vent status, TF tolerance, Labs, I & O's  REASON FOR ASSESSMENT:   Consult Enteral/tube feeding initiation and management  ASSESSMENT:   56 yo male with multiple co morbidities including COPD, AFib RVR, morbid obesity, on home O2, presented with resp distress and AMS with severe hypercarbia. Intubated on admission.  Discussed patient in ICU rounds and with RN today. Received MD Consult for TF initiation and management. Nutrition-Focused physical exam completed. Findings are no fat depletion, no muscle depletion, and moderate edema.  Patient is currently intubated on ventilator support Tmax 36.6 Propofol: 35.8 ml/hr providing 945 kcal from lipid  Diet Order:  Diet NPO time specified  Skin:  Reviewed, no issues (MASD to groin)  Last BM:  unknown  Height:   Ht Readings from Last 1 Encounters:  04/28/2016  (1.753 m)    Weight:   Wt Readings from Last 1 Encounters:  05/16/16 (!) 441 lb (200 kg)    Ideal Body Weight:  72.7 kg  BMI:  Body mass index is 65.12 kg/m.  Estimated Nutritional Needs:   Kcal:  1600-1820  Protein:  182 gm  Fluid:  2-2.5 L  EDUCATION NEEDS:   No education needs identified at this time  Joaquin Courts, RD, LDN, CNSC Pager [phone] After Hours Pager [phone]

## 2016-05-16 NOTE — Progress Notes (Signed)
*  PRELIMINARY RESULTS* Vascular Ultrasound Lower extremity venous duplex has been completed.  Preliminary findings: technically limited due to body habitus. No obvious DVT noted in visualized veins.   Farrel Demark, RDMS, RVT  05/16/2016, 3:00 PM

## 2016-05-16 NOTE — Progress Notes (Signed)
eLink Physician-Brief Progress Note Patient Name: Brett Curry DOB: 1960/04/15 MRN: [id number]   Date of Service  05/16/2016  HPI/Events of Note  Stable on IV cardizem 5/h Chr A fib  eICU Interventions  Transition to oral 60 q 6     Intervention Category Intermediate Interventions: Arrhythmia - evaluation and management  ALVA,RAKESH V. 05/16/2016, 5:51 PM

## 2016-05-16 NOTE — Progress Notes (Signed)
Inpatient Diabetes Program Recommendations  AACE/ADA: New Consensus Statement on Inpatient Glycemic Control (2015)  Target Ranges:  Prepandial:   less than 140 mg/dL      Peak postprandial:   less than 180 mg/dL (1-2 hours)      Critically ill patients:  140 - 180 mg/dL  Results for Brett, Curry (MRN [id number]) as of 05/16/2016 09:46  Ref. Range 04/25/2016 08:28 04/18/2016 09:40 04/18/2016 11:43 04/26/2016 15:17 05/14/2016 19:32 05/14/2016 23:47 05/16/2016 04:13 05/16/2016 07:38  Glucose-Capillary Latest Ref Range: 65 - 99 mg/dL 086 (H) 578 (H) 469 (H) 310 (H) 295 (H) 301 (H) 273 (H) 306 (H)  Results for Brett, Curry (MRN [id number]) as of 05/16/2016 09:46  Ref. Range 02/22/2016 18:46  Hemoglobin A1C Latest Ref Range: 4.8 - 5.6 % 11.9 (H)    Review of Glycemic Control  Diabetes history: DM2 Outpatient Diabetes medications: Amaryl 4 mg QAM, Metformin 1000 mg QHS, Levemir 60 units QHS, Actos 30 mg daily Current orders for Inpatient glycemic control: Levemir 40 units QHS, Novolog 0-15 units Q4H  Inpatient Diabetes Program Recommendations: Insulin - IV drip/GlucoStabilizer: Over the past 24 hours glucose has ranged from 273-354 mg/dl. Recommend discontinuing all current insulin orders and order ICU Glycemic Control order set Phase 2 (IV insulin).  NOTE: Glucose has ranged from 273-354 mg/dl over the past 24 hours despite Levemir and Novolog as ordered. Patient is ordered Solumedrol 60 mg Q12H which is contributing to hyperglycemia. Recommend all SQ insulin be discontinued and order ICU Glycemic Control order set Phase 2 (IV insulin) since patient is on ventilator and steroids. Last A1C 11.9% on 02/22/16.  Thanks, Orlando Penner, RN, MSN, CDE Diabetes Coordinator Inpatient Diabetes Program [phone] (Team Pager from 8am to 5pm)

## 2016-05-16 NOTE — Progress Notes (Addendum)
PULMONARY  / CRITICAL CARE MEDICINE  Name: Brett Curry MRN: [id number] DOB: April 01, 1960    LOS: 1  REFERRING MD :  Dr. Manus Gunning, EDP  CHIEF COMPLAINT:  Respiratory distress  BRIEF PATIENT DESCRIPTION:  56 year old man with PMH asthma, afib on anticoag, morbid obesity, COPD on continuous O2 at home, HLD, uncontrolled type II DM, presented via EMS to [facility] ED with the complaint of respiratory distress for a few days. With EMS he was found to be in Afib with RVR and had wheezing. His mental status worsened during transport, he was given 2 nebs and solumedrol by EMS. Upon arrival to the ED he was in extremis with respiratory distress, obtunded and unable to provide a history. His O2 sat was 83% and improved to 99% with oxygen. He was afebrile and BP was ok. He was intubated due to hypercarbic respiratory failure and altered mental status, ABG showed pH 7.118, CO2 above reportable range for lab, and PO2 178. Chest xray showed interstitial edema and large right pleural effusion. He received 40 mg IV lasix, magnesium, and cardizem gtt for afib with rvr.    LINES / TUBES: OG tube 4/29>> Urethral catheter 4/29 >> Peripheral IV left antecubital, right antecubital, right posterior antecubital 4/29>>  ETT 4/29 >>   CULTURES: MRSA - negative  Respiratory culture >> Blood cultures >>   ANTIBIOTICS: Cefepime  Vancomycin   SIGNIFICANT EVENTS:  CXR 4/29 >> interstitial edema, cardiomegaly, R pleural effusion  LEVEL OF CARE:  Critical care  PRIMARY SERVICE:  PCCM CONSULTANTS:  none CODE STATUS FULL  DIET:  NPO  DVT Px:  Lovenox  GI Px:  protonix    Prior to Admission medications   Medication Sig Start Date End Date Taking? Authorizing Provider  aspirin EC 81 MG EC tablet Take 1 tablet (81 mg total) by mouth daily. 02/27/16  Yes Carylon Perches, MD  diltiazem (CARDIZEM CD) 180 MG 24 hr capsule Take 180 mg by mouth daily.  06/30/15  Yes Historical Provider, MD  Fluticasone-Salmeterol (ADVAIR)  250-50 MCG/DOSE AEPB Inhale 1 puff into the lungs 2 (two) times daily. 12/15/15  Yes Coralyn Helling, MD  glimepiride (AMARYL) 4 MG tablet Take 4 mg by mouth daily before breakfast.   Yes Historical Provider, MD  insulin detemir (LEVEMIR) 100 UNIT/ML injection Inject 0.6 mLs (60 Units total) into the skin at bedtime. 02/27/16  Yes Carylon Perches, MD  ipratropium-albuterol (DUONEB) 0.5-2.5 (3) MG/3ML SOLN Take 3 mLs by nebulization every 6 (six) hours as needed (for wheezing or shortness of breath).    Yes Historical Provider, MD  Melatonin 10 MG TABS Take 10-15 mg by mouth at bedtime as needed (sleep).   Yes Historical Provider, MD  metFORMIN (GLUCOPHAGE) 1000 MG tablet Take 1,000 mg by mouth at bedtime.    Yes Historical Provider, MD  Omega-3 Fatty Acids (FISH OIL) 1200 MG CAPS Take 1,200 mg by mouth 2 (two) times daily.   Yes Historical Provider, MD  OXYGEN Inhale 7 L into the lungs continuous.    Yes Historical Provider, MD  pioglitazone (ACTOS) 30 MG tablet Take 30 mg by mouth daily.  06/22/15  Yes Historical Provider, MD  pravastatin (PRAVACHOL) 40 MG tablet Take 40 mg by mouth at bedtime.  06/30/15  Yes Historical Provider, MD  predniSONE (DELTASONE) 20 MG tablet Take 2 tablets (40 mg total) by mouth daily with breakfast. 02/27/16  Yes Carylon Perches, MD  traMADol (ULTRAM) 50 MG tablet Take 50 mg by mouth  at bedtime. 04/14/15  Yes Historical Provider, MD   VITAL SIGNS: Pulse Rate:  [85-215] 86 (04/30 1511) Resp:  [20-34] 20 (04/30 1511) BP: (95-127)/(54-81) 106/74 (04/30 1511) SpO2:  [91 %-96 %] 93 % (04/30 1511) FiO2 (%):  [50 %] 50 % (04/30 1511) Weight:  [200 kg (441 lb)] 200 kg (441 lb) (04/30 0427) HEMODYNAMICS:   VENTILATOR SETTINGS: Vent Mode: PRVC FiO2 (%):  [50 %] 50 % Set Rate:  [20 bmp-30 bmp] 20 bmp Vt Set:  [500 mL] 500 mL PEEP:  [10 cmH20] 10 cmH20 Plateau Pressure:  [22 cmH20-28 cmH20] 24 cmH20 INTAKE / OUTPUT: Intake/Output      04/29 0701 - 04/30 0700 04/30 0701 - 05/01 0700   I.V.  (mL/kg) 901.9 (4.5) 377.1 (1.9)   NG/GT  4   IV Piggyback 600 1000   Total Intake(mL/kg) 1501.9 (7.5) 1381.1 (6.9)   Urine (mL/kg/hr) 3425 (0.7) 480 (0.3)   Total Output 3425 480   Net -1923.1 +901.1          PHYSICAL EXAMINATION: General: Morbidly obese male  Neuro:  Sedated  HEENT: PERRLA  Cardiovascular:  Tachycardic, irregularly irregular rhythm, 2+ b/l lower extremity pitting edema to the knees. Distal pulses difficult to palpate  Lungs:  Intubated  Abdomen: BS+, soft, non tender Musculoskeletal:  Normal tone Skin:  Intact, warm, and dry    LABS: Cbc  Recent Labs Lab 05/14/2016 0539 05/16/16 0252  WBC 13.0* 12.1*  HGB 13.9 12.3*  HCT 47.8 40.0  PLT 200 163    Chemistry   Recent Labs Lab 05/04/2016 0539 04/24/2016 0808 04/30/2016 1318 05/16/16 0252  NA 135  --  133* 136  K 6.0* 5.6* 4.0 3.8  CL 87*  --  85* 88*  CO2 43*  --  37* 38*  BUN 14  --  12 18  CREATININE 0.66  --  0.66 0.81  CALCIUM 9.0  --  9.0 9.3  MG  --   --   --  1.7  PHOS  --   --   --  2.2*  GLUCOSE 338*  --  324* 252*    Liver fxn No results for input(s): AST, ALT, ALKPHOS, BILITOT, PROT, ALBUMIN in the last 168 hours. coags No results for input(s): APTT, INR in the last 168 hours. Sepsis markers  Recent Labs Lab 04/17/2016 0554 05/14/2016 1001 05/16/16 0252  LATICACIDVEN 0.86  --   --   PROCALCITON  --  <0.10 <0.10   Cardiac markers  Recent Labs Lab 05/14/2016 1001 04/17/2016 1550 05/06/2016 2244  TROPONINI <0.03 <0.03 <0.03   BNP No results for input(s): PROBNP in the last 168 hours. ABG  Recent Labs Lab 05/02/2016 1016 05/16/16 0348 05/16/16 1524  PHART 7.375 7.545* 7.431  PCO2ART 71.9* 47.7 64.6*  PO2ART 66.0* 63.4* 71.1*  HCO3 42.1* 41.1* 42.2*  TCO2 44  --   --     CBG trend  Recent Labs Lab 05/12/2016 2347 05/16/16 0413 05/16/16 0738 05/16/16 1113 05/16/16 1513  GLUCAP 301* 273* 306* 306* 330*    DIAGNOSES: Active Problems:   Respiratory failure with  hypercapnia (HCC)   ASSESSMENT / PLAN:  PULMONARY  ASSESSMENT: Acute on chronic hypercarbic and hypoxemic respiratory failure 2/2 acute exacerbation of severe COPD in the setting of URI (Rhinovirus) + OSA + OHS Hx of COPD ( FEV1/FVC 50%, FEV 28%. 05/2015)  Right pleural effusion  PLAN:   On full vent support  dulera BID  >> will switch to pulmicort  BID and duoneb qid Solumedrol 60 mg BID  Cont cefepime and vanc pending cultures.  Cont diuresis   CARDIOVASCULAR  ASSESSMENT:  Atrial fibrillation with RVR, rate 100-160 overnight  PLAN:  Cardizem gtt  Lasix 80 mg TID  Check lytes this pm   RENAL  ASSESSMENT:  Crt stable  Hyperkalemia resolved  PLAN:   Monitor  Continue diuresis with lasix. Check bmet this pm   GASTROINTESTINAL  ASSESSMENT:   No acute issues  PLAN:   protonix qhs  Start TF   HEMATOLOGIC  ASSESSMENT:  Leukocytosis 12, possible 2/2 steroid use  PLAN:  subq lovenox   INFECTIOUS  ASSESSMENT:   Treating for CAP  PLAN:   On cefepime and vancomycin. Recent admission in 02/2016.  Follow up procalcitonin, lactate, urine strep, urine legionella. PCT is reassuring, will consider deescalating in 24 hrs.  Follow up blood and urine cultures   ENDOCRINE  ASSESSMENT:   Hx of type II DM on levemir 40 units qHS, metformin 1000 mg qHS, and pioglitazone at home    PLAN:   Moderate sliding scale, levemir 40 units qHS   NEUROLOGIC  ASSESSMENT:   AMS 2/2 hypercarbia, agitated  PLAN:   On fentanyl gtt  On propofol gtt  Versed PRN  RASS goal -1   Pulmonary and Critical Care Medicine [facility] Pager: [phone]  05/16/2016, 4:21 PM    ATTENDING NOTE / ATTESTATION NOTE :   I have discussed the case with the resident/APP Dr. Obie Dredge  I agree with the resident/APP's  history, physical examination, assessment, and plans.    I have edited the above note and modified it according to our agreed history, physical examination, assessment  and plan.   Briefly, 56 year old man with PMH asthma, afib on anticoag, morbid obesity, COPD on continuous O2 at home, HLD, uncontrolled type II DM, presented via EMS to [facility] ED with the complaint of respiratory distress for a few days. With EMS he was found to be in Afib with RVR and had wheezing. His mental status worsened during transport, he was given 2 nebs and solumedrol by EMS. Upon arrival to the ED he was in extremis with respiratory distress, obtunded and unable to provide a history. His O2 sat was 83% and improved to 99% with oxygen. He was afebrile and BP was ok. He was intubated due to hypercarbic respiratory failure and altered mental status, ABG showed pH 7.118, CO2 above reportable range for lab, and PO2 178. Chest xray showed interstitial edema and large right pleural effusion. He received 40 mg IV lasix, magnesium, and cardizem gtt for afib with rvr.    Sedated, episodes of agitation.   Vitals:  Vitals:   05/16/16 1230 05/16/16 1246 05/16/16 1247 05/16/16 1511  BP: 110/77   106/74  Pulse: (!) 137   86  Resp: (!) 31   20  Temp:      TempSrc:      SpO2: 91% 94% 94% 93%  Weight:      Height:        Constitutional/General: well-nourished, well-developed, intubated, sedated, not in any distress  Body mass index is 65.12 kg/m. Wt Readings from Last 3 Encounters:  05/16/16 (!) 200 kg (441 lb)  02/27/16 (!) 199.5 kg (439 lb 13.1 oz)  12/15/15 (!) 190.5 kg (420 lb)    HEENT: PERLA, anicteric sclerae. (-) Oral thrush. Intubated, ETT in place  Neck: No masses. Midline trachea. No JVD, (-) LAD. (-) bruits appreciated.  Respiratory/Chest: Grossly normal chest. (-) deformity. (-) Accessory muscle use.  Symmetric expansion. Diminished BS on both lower lung zones. (-) wheezing,, rhonchi Crackles at bases.  (-) egophony  Cardiovascular: Regular rate and  rhythm, heart sounds normal, no murmur or gallops,  Gr 2 peripheral edema  Gastrointestinal:  Normal bowel sounds.  Soft, non-tender. No hepatosplenomegaly.  (-) masses.   Musculoskeletal:  Normal muscle tone.   Extremities: Grossly normal. (-) clubbing, cyanosis.  Gr 2 edema  Skin: (-) rash,lesions seen.   Neurological/Psychiatric : sedated, intubated. CN grossly intact. (-) lateralizing signs.     CBC Recent Labs     06-10-2016  0539  05/16/16  0252  WBC  13.0*  12.1*  HGB  13.9  12.3*  HCT  47.8  40.0  PLT  200  163    Coag's No results for input(s): APTT, INR in the last 72 hours.  BMET Recent Labs     10-Jun-2016  0539  2016/06/10  0808  Jun 10, 2016  1318  05/16/16  0252  NA  135   --   133*  136  K  6.0*  5.6*  4.0  3.8  CL  87*   --   85*  88*  CO2  43*   --   37*  38*  BUN  14   --   12  18  CREATININE  0.66   --   0.66  0.81  GLUCOSE  338*   --   324*  252*    Electrolytes Recent Labs     Jun 10, 2016  0539  06-10-2016  1318  05/16/16  0252  CALCIUM  9.0  9.0  9.3  MG   --    --   1.7  PHOS   --    --   2.2*    Sepsis Markers Recent Labs     06/10/16  1001  05/16/16  0252  PROCALCITON  <0.10  <0.10    ABG Recent Labs     06-10-16  1016  05/16/16  0348  05/16/16  1524  PHART  7.375  7.545*  7.431  PCO2ART  71.9*  47.7  64.6*  PO2ART  66.0*  63.4*  71.1*    Liver Enzymes No results for input(s): AST, ALT, ALKPHOS, BILITOT, ALBUMIN in the last 72 hours.  Cardiac Enzymes Recent Labs     06-10-16  1001  10-Jun-2016  1550  10-Jun-2016  2244  TROPONINI  <0.03  <0.03  <0.03    Glucose Recent Labs     10-Jun-2016  1932  10-Jun-2016  2347  05/16/16  0413  05/16/16  0738  05/16/16  1113  05/16/16  1513  GLUCAP  295*  301*  273*  306*  306*  330*    Imaging Dg Chest Port 1 View  Result Date: 05/16/2016 CLINICAL DATA:  Intubated patient, asthma -COPD, respiratory failure, morbid obesity. EXAM: PORTABLE CHEST 1 VIEW COMPARISON:  Portable chest x-ray of 2016/06/10 FINDINGS: The lungs are adequately inflated. Pleural fluid layers posteriorly on the right. The  interstitial markings remain increased bilaterally. The cardiac silhouette remains enlarged and the pulmonary vascularity remains engorged. The endotracheal tube tip lies 5.3 cm above the carina. The esophagogastric tube tip projects below the inferior margin of the image. IMPRESSION: Improved aeration of both lungs which is in part secondary to changes in patient positioning. Persistent moderate-sized right pleural effusion. Stable cardiomegaly with mild pulmonary interstitial edema. Electronically Signed   By:  David  [place] M.D.   On: 05/16/2016 07:02   Dg Chest Portable 1 View  Result Date: 02-Jun-2016 CLINICAL DATA:  Respiratory distress, endotracheal tube placement. EXAM: PORTABLE CHEST 1 VIEW COMPARISON:  Chest radiograph February 25, 2016 FINDINGS: Large body habitus limits evaluation. Endotracheal tube tip projects 1.9 cm above the carina. Naso gastric tube in place, distal tip difficult to visualize. The cardiac silhouette appears moderately enlarged. Low inspiratory examination with moderate to large RIGHT pleural effusion and pulmonary vascular congestion/interstitial edema. No pneumothorax. Resected distal LEFT clavicle. IMPRESSION: Habitus limited examination. Endotracheal tube projects 1.9 cm above the carina, nasogastric tube distal aspect not well characterized. Moderate to severe cardiomegaly and pulmonary interstitial edema. Moderate to large layering RIGHT pleural effusion. Electronically Signed   By: Awilda Metro M.D.   On: Jun 02, 2016 06:22    Assessment/Plan : Acute on chronic hypercarbic and hypoxemic respiratory failure 2/2 acute exacerbation of severe COPD in the setting of URI (Rhinovirus) + Pulmonary Edema + OSA + OHS + CHFpEF Hx of COPD ( FEV1/FVC 50%, FEV 28%. 05/2015)  - cont vent support.  We adjusted his settings secondary to alkalosis. Repeat ABG is acceptable. - Continue diuresis.  - check 2 d echo - cont cefepime + vanc pending cultures   Atrial Fibrillation - HR  better on cardizem drip.  - h/o paroxysmal afib, not on OAC.    Diabetes - CBG in 300 mg% range w/o TF >> will transition to insulin drip    Best practice : on heparin for DVT prophylaxis. On PPI for SUP.   I spent  30  minutes of Critical Care time with this patient today. This is my time spent independent of the APP or resident.   Family :   No family at bedside.    Pollie Meyer, MD 05/16/2016, 4:21 PM [facility] Pager [phone] After 3 pm or if no answer, call [phone]

## 2016-05-16 NOTE — Procedures (Signed)
Central Venous Catheter Insertion Procedure Note Brett Curry [name] [id number] [birth year]  Procedure: Insertion of Central Venous Catheter Indications: Assessment of intravascular volume, Drug and/or fluid administration and Frequent blood sampling  Procedure Details Consent: Unable to obtain consent because of emergent medical necessity. Time Out: Verified patient identification, verified procedure, site/side was marked, verified correct patient position, special equipment/implants available, medications/allergies/relevent history reviewed, required imaging and test results available.  Performed  Maximum sterile technique was used including antiseptics, cap, gloves, gown, hand hygiene, mask and sheet. Skin prep: Chlorhexidine; local anesthetic administered A antimicrobial bonded/coated triple lumen catheter was placed in the right femoral vein due to body habitus and high PEEP demands as well as intermittent agitation using the Seldinger technique.  Biopatch placed.   Evaluation Blood flow good Complications: No apparent complications Patient did tolerate procedure well.  Procedure performed under direct supervision of Dr. Christene Slates and with ultrasound guidance for real time vessel cannulation.     [name], AGACNP-BC [place] [facility] Pgr: [phone] or if no answer [phone] 05/16/2016, 2:05 PM

## 2016-05-17 ENCOUNTER — Inpatient Hospital Stay (HOSPITAL_COMMUNITY): Payer: Medicare Other

## 2016-05-17 DIAGNOSIS — J90 Pleural effusion, not elsewhere classified: Secondary | ICD-10-CM

## 2016-05-17 DIAGNOSIS — G9341 Metabolic encephalopathy: Secondary | ICD-10-CM

## 2016-05-17 DIAGNOSIS — E878 Other disorders of electrolyte and fluid balance, not elsewhere classified: Secondary | ICD-10-CM

## 2016-05-17 DIAGNOSIS — J189 Pneumonia, unspecified organism: Secondary | ICD-10-CM

## 2016-05-17 LAB — GLUCOSE, CAPILLARY
GLUCOSE-CAPILLARY: 119 mg/dL — AB (ref 65–99)
GLUCOSE-CAPILLARY: 152 mg/dL — AB (ref 65–99)
GLUCOSE-CAPILLARY: 156 mg/dL — AB (ref 65–99)
GLUCOSE-CAPILLARY: 166 mg/dL — AB (ref 65–99)
GLUCOSE-CAPILLARY: 186 mg/dL — AB (ref 65–99)
GLUCOSE-CAPILLARY: 218 mg/dL — AB (ref 65–99)
Glucose-Capillary: 151 mg/dL — ABNORMAL HIGH (ref 65–99)
Glucose-Capillary: 158 mg/dL — ABNORMAL HIGH (ref 65–99)
Glucose-Capillary: 159 mg/dL — ABNORMAL HIGH (ref 65–99)
Glucose-Capillary: 162 mg/dL — ABNORMAL HIGH (ref 65–99)
Glucose-Capillary: 170 mg/dL — ABNORMAL HIGH (ref 65–99)
Glucose-Capillary: 171 mg/dL — ABNORMAL HIGH (ref 65–99)
Glucose-Capillary: 175 mg/dL — ABNORMAL HIGH (ref 65–99)
Glucose-Capillary: 262 mg/dL — ABNORMAL HIGH (ref 65–99)
Glucose-Capillary: 293 mg/dL — ABNORMAL HIGH (ref 65–99)
Glucose-Capillary: 307 mg/dL — ABNORMAL HIGH (ref 65–99)
Glucose-Capillary: 332 mg/dL — ABNORMAL HIGH (ref 65–99)

## 2016-05-17 LAB — BASIC METABOLIC PANEL
ANION GAP: 9 (ref 5–15)
Anion gap: 17 — ABNORMAL HIGH (ref 5–15)
BUN: 31 mg/dL — ABNORMAL HIGH (ref 6–20)
BUN: 42 mg/dL — ABNORMAL HIGH (ref 6–20)
CALCIUM: 8.9 mg/dL (ref 8.9–10.3)
CALCIUM: 7.8 mg/dL — AB (ref 8.9–10.3)
CO2: 39 mmol/L — ABNORMAL HIGH (ref 22–32)
CO2: 28 mmol/L (ref 22–32)
CREATININE: 0.89 mg/dL (ref 0.61–1.24)
CREATININE: 0.95 mg/dL (ref 0.61–1.24)
Chloride: 90 mmol/L — ABNORMAL LOW (ref 101–111)
Chloride: 83 mmol/L — ABNORMAL LOW (ref 101–111)
GFR calc Af Amer: >60 mL/min (ref >60)
GLUCOSE: 171 mg/dL — AB (ref 65–99)
Glucose, Bld: 298 mg/dL — ABNORMAL HIGH (ref 65–99)
POTASSIUM: 4.2 mmol/L (ref 3.5–5.1)
Potassium: 3.5 mmol/L (ref 3.5–5.1)
SODIUM: 128 mmol/L — AB (ref 135–145)
Sodium: 138 mmol/L (ref 135–145)

## 2016-05-17 LAB — PROCALCITONIN

## 2016-05-17 LAB — CBC
HCT: 39.6 % (ref 39.0–52.0)
HEMOGLOBIN: 12 g/dL — AB (ref 13.0–17.0)
MCH: 28.2 pg (ref 26.0–34.0)
MCHC: 30.3 g/dL (ref 30.0–36.0)
MCV: 93.2 fL (ref 78.0–100.0)
PLATELETS: 153 10*3/uL (ref 150–400)
RBC: 4.25 MIL/uL (ref 4.22–5.81)
RDW: 16.1 % — ABNORMAL HIGH (ref 11.5–15.5)
WBC: 11.5 10*3/uL — ABNORMAL HIGH (ref 4.0–10.5)

## 2016-05-17 LAB — PHOSPHORUS: Phosphorus: 6.2 mg/dL — ABNORMAL HIGH (ref 2.5–4.6)

## 2016-05-17 LAB — MAGNESIUM: Magnesium: 2.2 mg/dL (ref 1.7–2.4)

## 2016-05-17 MED ORDER — INSULIN DETEMIR 100 UNIT/ML ~~LOC~~ SOLN
40.0000 [IU] | SUBCUTANEOUS | Status: DC
  Administered 2016-05-17: 40 [IU] via SUBCUTANEOUS
  Filled 2016-05-17 (×2): qty 0.4

## 2016-05-17 MED ORDER — METHYLPREDNISOLONE SODIUM SUCC 40 MG IJ SOLR
30.0000 mg | Freq: Two times a day (BID) | INTRAMUSCULAR | Status: DC
  Administered 2016-05-17: 30 mg via INTRAVENOUS
  Filled 2016-05-17 (×2): qty 0.75

## 2016-05-17 MED ORDER — INSULIN ASPART 100 UNIT/ML ~~LOC~~ SOLN: 2.0000 [IU] | SUBCUTANEOUS | Status: DC

## 2016-05-17 MED ORDER — DEXTROSE 10 % IV SOLN: INTRAVENOUS | Status: DC | PRN

## 2016-05-17 MED ORDER — SODIUM CHLORIDE 0.9% FLUSH
10.0000 mL | Freq: Two times a day (BID) | INTRAVENOUS | Status: DC
  Administered 2016-05-17 – 2016-05-25 (×17): 10 mL

## 2016-05-17 MED ORDER — SODIUM CHLORIDE 0.9% FLUSH: 10.0000 mL | INTRAVENOUS | Status: DC | PRN

## 2016-05-17 MED ORDER — INSULIN ASPART 100 UNIT/ML ~~LOC~~ SOLN
0.0000 [IU] | SUBCUTANEOUS | Status: DC
  Administered 2016-05-17: 5 [IU] via SUBCUTANEOUS
  Administered 2016-05-17 – 2016-05-18 (×2): 7 [IU] via SUBCUTANEOUS
  Administered 2016-05-18: 9 [IU] via SUBCUTANEOUS
  Administered 2016-05-18: 7 [IU] via SUBCUTANEOUS

## 2016-05-17 MED ORDER — CHLORHEXIDINE GLUCONATE CLOTH 2 % EX PADS
6.0000 | MEDICATED_PAD | Freq: Every day | CUTANEOUS | Status: DC
  Administered 2016-05-17 – 2016-05-25 (×7): 6 via TOPICAL

## 2016-05-17 MED ORDER — INSULIN ASPART 100 UNIT/ML ~~LOC~~ SOLN: 7.0000 [IU] | SUBCUTANEOUS | Status: DC

## 2016-05-17 NOTE — Progress Notes (Signed)
Peripherally Inserted Central Catheter/Midline Placement  The IV Nurse has discussed with the patient and/or persons authorized to consent for the patient, the purpose of this procedure and the potential benefits and risks involved with this procedure.  The benefits include less needle sticks, lab draws from the catheter, and the patient may be discharged home with the catheter. Risks include, but not limited to, infection, bleeding, blood clot (thrombus formation), and puncture of an artery; nerve damage and irregular heartbeat and possibility to perform a PICC exchange if needed/ordered by physician.  Alternatives to this procedure were also discussed.  Bard Power PICC patient education guide, fact sheet on infection prevention and patient information card has been provided to patient /or left at bedside.    PICC/Midline Placement Documentation     Consent signed by wife at bedside   Maximino Greenland 05/17/2016, 11:57 AM

## 2016-05-17 NOTE — Progress Notes (Signed)
   [facility] PCCM  > wife and daughter updated today. Pt has been sick x 3 weeks and has been on prednisone taper.   Pollie Meyer, MD 05/17/2016, 12:48 PM [place] [facility] Pager [phone] After 3 pm or if no answer, call [phone]

## 2016-05-17 NOTE — Progress Notes (Signed)
PULMONARY  / CRITICAL CARE MEDICINE  Name: Brett Curry MRN: [id number] DOB: [birth year]    LOS: 2  REFERRING MD :  Dr. Manus Gunning, EDP  CHIEF COMPLAINT:  Respiratory distress  BRIEF PATIENT DESCRIPTION:  56 year old man with PMH asthma, afib on anticoag, morbid obesity, COPD on continuous O2 at home, HLD, uncontrolled type II DM, presented via EMS to [facility] ED with the complaint of respiratory distress for a few days. With EMS he was found to be in Afib with RVR and had wheezing. His mental status worsened during transport, he was given 2 nebs and solumedrol by EMS. Upon arrival to the ED he was in extremis with respiratory distress, obtunded and unable to provide a history. His O2 sat was 83% and improved to 99% with oxygen. He was afebrile and BP was ok. He was intubated due to hypercarbic respiratory failure and altered mental status, ABG showed pH 7.118, CO2 above reportable range for lab, and PO2 178. Chest xray showed interstitial edema and large right pleural effusion. He received 40 mg IV lasix, magnesium, and cardizem gtt for afib with rvr.    LINES / TUBES: OG tube 4/29>> Urethral catheter 4/29 >> Peripheral IV left antecubital, right antecubital, right posterior antecubital 4/29>>  ETT 4/29 >>   CULTURES: MRSA - negative  Respiratory culture >> Blood cultures >> (-)  ANTIBIOTICS: Cefepime  Vancomycin > 5/1  SIGNIFICANT EVENTS:  CXR 4/29 >> interstitial edema, cardiomegaly, R pleural effusion   VITAL SIGNS: Pulse Rate:  [36-197] 91 (05/01 1000) Resp:  [20-31] 20 (05/01 1000) BP: (96-125)/(58-89) 125/68 (05/01 1000) SpO2:  [91 %-96 %] 94 % (05/01 1000) FiO2 (%):  [40 %-50 %] 40 % (05/01 0801) Weight:  [200 kg (440 lb 14.7 oz)] 200 kg (440 lb 14.7 oz) (05/01 0413) HEMODYNAMICS:   VENTILATOR SETTINGS: Vent Mode: PRVC FiO2 (%):  [40 %-50 %] 40 % Set Rate:  [20 bmp] 20 bmp Vt Set:  [500 mL] 500 mL PEEP:  [10 cmH20] 10 cmH20 Plateau Pressure:  [22 cmH20-25 cmH20]  22 cmH20 INTAKE / OUTPUT: Intake/Output      04/30 0701 - 05/01 0700 05/01 0701 - 05/02 0700   I.V. (mL/kg) 1865.1 (9.3) 227 (1.1)   NG/GT 110.7 140   IV Piggyback 1965    Total Intake(mL/kg) 3940.7 (19.7) 367 (1.8)   Urine (mL/kg/hr) 2450 (0.5) 850 (1)   Total Output 2450 850   Net +1490.7 -483          PHYSICAL EXAMINATION: General: Morbidly obese male, sedated, NAD Neuro:  Sedated CN grossly intact HEENT: PERRLA  Cardiovascular:  Tachycardic, irregularly irregular rhythm, 2+ b/l lower extremity pitting edema to the knees >> less edema.  Distal pulses difficult to palpate  Lungs:  Intubated . Dec BS on BLF. Crackles at bases.  Abdomen: BS+, soft, non tender Musculoskeletal:  Normal tone Skin:  Intact, warm, and dry . Still with edema but less.    LABS: Cbc  Recent Labs Lab 05/10/2016 0539 05/16/16 0252 05/17/16 0225  WBC 13.0* 12.1* 11.5*  HGB 13.9 12.3* 12.0*  HCT 47.8 40.0 39.6  PLT 200 163 153    Chemistry   Recent Labs Lab 05/16/16 0252 05/16/16 1555 05/17/16 0225  NA 136 135 138  K 3.8 3.7 3.5  CL 88* 88* 90*  CO2 38* 36* 39*  BUN 18 26* 31*  CREATININE 0.81 0.93 0.89  CALCIUM 9.3 8.8* 8.9  MG 1.7  --  2.2  PHOS  2.2*  --  6.2*  GLUCOSE 252* 344* 171*    Liver fxn No results for input(s): AST, ALT, ALKPHOS, BILITOT, PROT, ALBUMIN in the last 168 hours. coags No results for input(s): APTT, INR in the last 168 hours. Sepsis markers  Recent Labs Lab May 22, 2016 0554 05/22/2016 1001 05/16/16 0252 05/17/16 0225  LATICACIDVEN 0.86  --   --   --   PROCALCITON  --  <0.10 <0.10 <0.10   Cardiac markers  Recent Labs Lab 05-22-16 1001 05-22-2016 1550 05/22/2016 2244  TROPONINI <0.03 <0.03 <0.03   BNP No results for input(s): PROBNP in the last 168 hours. ABG  Recent Labs Lab 22-May-2016 1016 05/16/16 0348 05/16/16 1524  PHART 7.375 7.545* 7.431  PCO2ART 71.9* 47.7 64.6*  PO2ART 66.0* 63.4* 71.1*  HCO3 42.1* 41.1* 42.2*  TCO2 44  --   --      CBG trend  Recent Labs Lab 05/17/16 0557 05/17/16 0652 05/17/16 0755 05/17/16 0859 05/17/16 0959  GLUCAP 159* 170* 262* 171* 119*    DIAGNOSES: Active Problems:   Respiratory failure with hypercapnia (HCC)   Acute pulmonary edema (HCC)   ASSESSMENT / PLAN:  PULMONARY  ASSESSMENT: Acute on chronic hypercarbic and hypoxemic respiratory failure 2/2 acute exacerbation of severe COPD in the setting of URI (Rhinovirus) + Pulmonary edema + OSA + OHS Hx of COPD ( FEV1/FVC 50%, FEV 28%. 05/2015)  Right pleural effusion  PLAN:   On full vent support  Cont diuresis cont pulmicort BID and duoneb qid Solumedrol 60 mg BID >> will switch to 30 BID  ( has been on pred 40 daily since 02/27/16) >> need to verify with wife Cont cefepime pending final cultures.  Will d/c vanc.    CARDIOVASCULAR  ASSESSMENT:  Atrial fibrillation.  PLAN:  Cardizem gtt switched to Po cardizem Lasix 80 mg TID  Check lytes this pm   RENAL  ASSESSMENT:  Crt stable  Hyperkalemia resolved  PLAN:   Monitor  Continue diuresis with lasix.   GASTROINTESTINAL  ASSESSMENT:   No acute issues  PLAN:   protonix qhs  Cont TF   HEMATOLOGIC  ASSESSMENT:  Leukocytosis likely 2/2 steroids PLAN:  subq lovenox    INFECTIOUS  ASSESSMENT:   Treating for CAP  PLAN:   On cefepime pending cultures. Will d/c vanc on 5/1.  Recent admission in 02/2016.  Follow up blood and urine cultures    ENDOCRINE  ASSESSMENT:   Hx of type II DM on levemir 40 units qHS, metformin 1000 mg qHS, and pioglitazone at home    PLAN:   On insulin drip >> will transition to levimir and TF coverage   NEUROLOGIC  ASSESSMENT:   AMS 2/2 hypercarbia, agitated  PLAN:   On fentanyl gtt  On propofol gtt  Versed PRN  RASS goal -1    I spent  30  minutes of Critical Care time with this patient today.   Family :   No family at bedside.    Pollie Meyer, MD 05/17/2016, 11:07 AM [facility] Pager [phone] After 3 pm or if no answer, call [phone]

## 2016-05-17 NOTE — Progress Notes (Signed)
eLink Physician-Brief Progress Note Patient Name: Brett Curry [facility] DOB: 30-Jun-1960 MRN: [id number]   Date of Service  05/17/2016  HPI/Events of Note  Blood glucose = 293. Currently on Lantus + standard Novolog SSI.  eICU Interventions  Will change Q 4 hour standard Novolog SSI to sensitive Novolog SSI.     Intervention Category Major Interventions: Hyperglycemia - active titration of insulin therapy  Lenell Antu 05/17/2016, 4:39 PM

## 2016-05-17 NOTE — Progress Notes (Signed)
eLink Physician-Brief Progress Note Patient Name: OSLO HUNTSMAN DOB: November 25, 1960 MRN: [id number]   Date of Service  05/17/2016  HPI/Events of Note  Agitation - Request to renew bilateral soft wrist restraints.   eICU Interventions  Will renew bilateral soft wrist restraints.      Intervention Category Minor Interventions: Agitation / anxiety - evaluation and management  Lenell Antu 05/17/2016, 7:34 PM

## 2016-05-17 DEATH — deceased

## 2016-05-18 DIAGNOSIS — J9621 Acute and chronic respiratory failure with hypoxia: Secondary | ICD-10-CM

## 2016-05-18 DIAGNOSIS — J189 Pneumonia, unspecified organism: Secondary | ICD-10-CM

## 2016-05-18 DIAGNOSIS — J9622 Acute and chronic respiratory failure with hypercapnia: Principal | ICD-10-CM

## 2016-05-18 DIAGNOSIS — Y95 Nosocomial condition: Secondary | ICD-10-CM

## 2016-05-18 LAB — GLUCOSE, CAPILLARY
GLUCOSE-CAPILLARY: 364 mg/dL — AB (ref 65–99)
GLUCOSE-CAPILLARY: 371 mg/dL — AB (ref 65–99)
GLUCOSE-CAPILLARY: 427 mg/dL — AB (ref 65–99)
GLUCOSE-CAPILLARY: 390 mg/dL — AB (ref 65–99)
GLUCOSE-CAPILLARY: 268 mg/dL — AB (ref 65–99)
GLUCOSE-CAPILLARY: 240 mg/dL — AB (ref 65–99)
Glucose-Capillary: 323 mg/dL — ABNORMAL HIGH (ref 65–99)
Glucose-Capillary: 390 mg/dL — ABNORMAL HIGH (ref 65–99)
Glucose-Capillary: 403 mg/dL — ABNORMAL HIGH (ref 65–99)
Glucose-Capillary: 343 mg/dL — ABNORMAL HIGH (ref 65–99)
Glucose-Capillary: 311 mg/dL — ABNORMAL HIGH (ref 65–99)

## 2016-05-18 LAB — CULTURE, RESPIRATORY

## 2016-05-18 LAB — CBC
HEMATOCRIT: 37.8 % — AB (ref 39.0–52.0)
HEMOGLOBIN: 12 g/dL — AB (ref 13.0–17.0)
MCH: 29.9 pg (ref 26.0–34.0)
MCHC: 31.7 g/dL (ref 30.0–36.0)
MCV: 94.3 fL (ref 78.0–100.0)
Platelets: 149 10*3/uL — ABNORMAL LOW (ref 150–400)
RBC: 4.01 MIL/uL — AB (ref 4.22–5.81)
RDW: 15.8 % — ABNORMAL HIGH (ref 11.5–15.5)
WBC: 11.3 10*3/uL — ABNORMAL HIGH (ref 4.0–10.5)

## 2016-05-18 LAB — BASIC METABOLIC PANEL
ANION GAP: 15 (ref 5–15)
ANION GAP: 8 (ref 5–15)
BUN: 49 mg/dL — ABNORMAL HIGH (ref 6–20)
BUN: 59 mg/dL — ABNORMAL HIGH (ref 6–20)
CALCIUM: 8.2 mg/dL — AB (ref 8.9–10.3)
CALCIUM: 8.7 mg/dL — AB (ref 8.9–10.3)
CO2: 32 mmol/L (ref 22–32)
CO2: 41 mmol/L — AB (ref 22–32)
Chloride: 84 mmol/L — ABNORMAL LOW (ref 101–111)
Chloride: 85 mmol/L — ABNORMAL LOW (ref 101–111)
Creatinine, Ser: 0.97 mg/dL (ref 0.61–1.24)
Creatinine, Ser: 1.03 mg/dL (ref 0.61–1.24)
GFR calc non Af Amer: >60 mL/min (ref >60)
GLUCOSE: 349 mg/dL — AB (ref 65–99)
GLUCOSE: 409 mg/dL — AB (ref 65–99)
POTASSIUM: 4 mmol/L (ref 3.5–5.1)
POTASSIUM: 3.9 mmol/L (ref 3.5–5.1)
Sodium: 131 mmol/L — ABNORMAL LOW (ref 135–145)
Sodium: 134 mmol/L — ABNORMAL LOW (ref 135–145)

## 2016-05-18 LAB — TRIGLYCERIDES
TRIGLYCERIDES: 164 mg/dL — AB (ref <150)
Triglycerides: 1395 mg/dL — ABNORMAL HIGH (ref <150)

## 2016-05-18 LAB — CULTURE, RESPIRATORY W GRAM STAIN

## 2016-05-18 LAB — PHOSPHORUS: PHOSPHORUS: 5.8 mg/dL — AB (ref 2.5–4.6)

## 2016-05-18 LAB — MAGNESIUM: MAGNESIUM: 1.9 mg/dL (ref 1.7–2.4)

## 2016-05-18 LAB — PROCALCITONIN: Procalcitonin: <0.1 ng/mL

## 2016-05-18 MED ORDER — INSULIN GLARGINE 100 UNIT/ML ~~LOC~~ SOLN: 10.0000 [IU] | SUBCUTANEOUS | Status: DC

## 2016-05-18 MED ORDER — INSULIN REGULAR HUMAN 100 UNIT/ML IJ SOLN
INTRAMUSCULAR | Status: DC
  Administered 2016-05-18: 3.1 [IU]/h via INTRAVENOUS
  Administered 2016-05-19: 5.7 [IU]/h via INTRAVENOUS
  Filled 2016-05-18 (×2): qty 2.5

## 2016-05-18 MED ORDER — PREDNISONE 20 MG PO TABS
30.0000 mg | ORAL_TABLET | Freq: Every day | ORAL | Status: AC
  Administered 2016-05-20 – 2016-05-21 (×2): 30 mg via ORAL
  Filled 2016-05-18 (×2): qty 1

## 2016-05-18 MED ORDER — INSULIN ASPART 100 UNIT/ML ~~LOC~~ SOLN
2.0000 [IU] | SUBCUTANEOUS | Status: DC
  Administered 2016-05-18: 2 [IU] via SUBCUTANEOUS

## 2016-05-18 MED ORDER — DEXTROSE 5 % IV SOLN
2.0000 g | Freq: Three times a day (TID) | INTRAVENOUS | Status: DC
  Filled 2016-05-18: qty 2

## 2016-05-18 MED ORDER — DEXTROSE 5 % IV SOLN
2.0000 g | Freq: Three times a day (TID) | INTRAVENOUS | Status: AC
  Administered 2016-05-18 – 2016-05-21 (×11): 2 g via INTRAVENOUS
  Filled 2016-05-18 (×11): qty 2

## 2016-05-18 MED ORDER — PANTOPRAZOLE SODIUM 40 MG PO PACK
40.0000 mg | PACK | Freq: Every day | ORAL | Status: DC
  Administered 2016-05-18 – 2016-05-26 (×8): 40 mg
  Filled 2016-05-18 (×9): qty 20

## 2016-05-18 MED ORDER — INSULIN ASPART 100 UNIT/ML ~~LOC~~ SOLN
0.0000 [IU] | SUBCUTANEOUS | Status: DC
  Administered 2016-05-18: 20 [IU] via SUBCUTANEOUS

## 2016-05-18 MED ORDER — PREDNISONE 10 MG PO TABS
10.0000 mg | ORAL_TABLET | Freq: Every day | ORAL | Status: AC
  Administered 2016-05-24 – 2016-05-25 (×2): 10 mg via ORAL
  Filled 2016-05-18 (×2): qty 1

## 2016-05-18 MED ORDER — INSULIN DETEMIR 100 UNIT/ML ~~LOC~~ SOLN
60.0000 [IU] | Freq: Every day | SUBCUTANEOUS | Status: DC
  Administered 2016-05-18: 60 [IU] via SUBCUTANEOUS
  Filled 2016-05-18: qty 0.6

## 2016-05-18 MED ORDER — POLYETHYLENE GLYCOL 3350 17 G PO PACK
17.0000 g | PACK | Freq: Two times a day (BID) | ORAL | Status: DC
  Administered 2016-05-18 – 2016-05-23 (×11): 17 g via ORAL
  Filled 2016-05-18 (×11): qty 1

## 2016-05-18 MED ORDER — PREDNISONE 20 MG PO TABS
40.0000 mg | ORAL_TABLET | Freq: Every day | ORAL | Status: AC
  Administered 2016-05-18 – 2016-05-19 (×2): 40 mg via ORAL
  Filled 2016-05-18 (×2): qty 2

## 2016-05-18 MED ORDER — ENOXAPARIN SODIUM 100 MG/ML ~~LOC~~ SOLN
95.0000 mg | SUBCUTANEOUS | Status: DC
  Administered 2016-05-18 – 2016-05-23 (×6): 95 mg via SUBCUTANEOUS
  Filled 2016-05-18 (×7): qty 0.95

## 2016-05-18 MED ORDER — PREDNISONE 5 MG PO TABS
5.0000 mg | ORAL_TABLET | Freq: Every day | ORAL | Status: AC
  Administered 2016-05-26: 5 mg via ORAL
  Filled 2016-05-18: qty 1

## 2016-05-18 MED ORDER — PREDNISONE 20 MG PO TABS
20.0000 mg | ORAL_TABLET | Freq: Every day | ORAL | Status: AC
  Administered 2016-05-22 – 2016-05-23 (×2): 20 mg via ORAL
  Filled 2016-05-18 (×2): qty 1

## 2016-05-18 NOTE — Progress Notes (Signed)
PULMONARY  / CRITICAL CARE MEDICINE  Name: Brett Curry MRN: [id number] DOB: May 26, 1960    LOS: 3  REFERRING MD :  Dr. Manus Gunning, EDP  CHIEF COMPLAINT:  Respiratory distress  BRIEF PATIENT DESCRIPTION:  56 year old man with PMH asthma, afib on anticoag, morbid obesity, COPD on continuous O2 at home, HLD, uncontrolled type II DM, presented via EMS to [facility] ED with the complaint of respiratory distress for a few days. With EMS he was found to be in Afib with RVR and had wheezing. His mental status worsened during transport, he was given 2 nebs and solumedrol by EMS. Upon arrival to the ED he was in extremis with respiratory distress, obtunded and unable to provide a history. His O2 sat was 83% and improved to 99% with oxygen. He was afebrile and BP was ok. He was intubated due to hypercarbic respiratory failure and altered mental status, ABG showed pH 7.118, CO2 above reportable range for lab, and PO2 178. Chest xray showed interstitial edema and large right pleural effusion. He received 40 mg IV lasix, magnesium, and cardizem gtt for afib with rvr.    LINES / TUBES: OG tube 4/29>> Urethral catheter 4/29 >> Peripheral IV left antecubital, right antecubital, right posterior antecubital 4/29>>  ETT 4/29 >>  R PICC 5/1 >   CULTURES: MRSA - negative  Respiratory culture >> PSA Blood cultures >> (-)  ANTIBIOTICS: Cefepime 4/29 > 52 Vancomycin 4/29 > 5/1 Fortaz 5/2   SIGNIFICANT EVENTS:  4/29 > admitted for respiratory failure   VITAL SIGNS: Temp:  [97.9 F (36.6 C)-98.7 F (37.1 C)] 98.1 F (36.7 C) (05/02 0408) Pulse Rate:  [55-143] 104 (05/02 0900) Resp:  [20-30] 24 (05/02 0900) BP: (95-125)/(59-89) 114/82 (05/02 0900) SpO2:  [89 %-98 %] 92 % (05/02 0900) FiO2 (%):  [50 %] 50 % (05/02 0800) Weight:  [190.5 kg (420 lb)] 190.5 kg (420 lb) (05/02 0500) HEMODYNAMICS:   VENTILATOR SETTINGS: Vent Mode: PRVC FiO2 (%):  [50 %] 50 % Set Rate:  [20 bmp] 20 bmp Vt Set:  [500  mL] 500 mL PEEP:  [10 cmH20] 10 cmH20 Plateau Pressure:  [23 cmH20-32 cmH20] 23 cmH20 INTAKE / OUTPUT: Intake/Output      05/01 0701 - 05/02 0700 05/02 0701 - 05/03 0700   I.V. (mL/kg) 2110.4 (11.1) 308.1 (1.6)   NG/GT 340 30   IV Piggyback 150    Total Intake(mL/kg) 2600.4 (13.7) 338.1 (1.8)   Urine (mL/kg/hr) 3600 (0.8) 675 (1.1)   Emesis/NG output 150 (0)    Total Output 3750 675   Net -1149.6 -336.9          PHYSICAL EXAMINATION: General: Morbidly obese male, sedated, NAD Neuro:  Sedated CN grossly intact HEENT: PERRLA  Cardiovascular:  Tachycardic, irregularly irregular rhythm, less edema.   Lungs:  Intubated . Dec BS on BLF. Crackles at bases.  Abdomen: BS+, soft, distended,  non tender Musculoskeletal:  Normal tone Skin:  Intact, warm, and dry . Still with edema but less.    LABS: Cbc  Recent Labs Lab 05/16/16 0252 05/17/16 0225 05/18/16 0318  WBC 12.1* 11.5* 11.3*  HGB 12.3* 12.0* 12.0*  HCT 40.0 39.6 37.8*  PLT 163 153 149*    Chemistry   Recent Labs Lab 05/16/16 0252  05/17/16 0225 05/17/16 1830 05/18/16 0318  NA 136  < > 138 128* 131*  K 3.8  < > 3.5 4.2 4.0  CL 88*  < > 90* 83* 84*  CO2  38*  < > 39* 28 32  BUN 18  < > 31* 42* 49*  CREATININE 0.81  < > 0.89 0.95 0.97  CALCIUM 9.3  < > 8.9 7.8* 8.2*  MG 1.7  --  2.2  --  1.9  PHOS 2.2*  --  6.2*  --  5.8*  GLUCOSE 252*  < > 171* 298* 349*  < > = values in this interval not displayed.  Liver fxn No results for input(s): AST, ALT, ALKPHOS, BILITOT, PROT, ALBUMIN in the last 168 hours. coags No results for input(s): APTT, INR in the last 168 hours. Sepsis markers  Recent Labs Lab 05/05/2016 0554  05/16/16 0252 05/17/16 0225 05/18/16 0318  LATICACIDVEN 0.86  --   --   --   --   PROCALCITON  --   < > <0.10 <0.10 <0.10  < > = values in this interval not displayed. Cardiac markers  Recent Labs Lab 04/27/2016 1001 04/23/2016 1550 05/01/2016 2244  TROPONINI <0.03 <0.03 <0.03   BNP No  results for input(s): PROBNP in the last 168 hours. ABG  Recent Labs Lab 05/10/2016 1016 05/16/16 0348 05/16/16 1524  PHART 7.375 7.545* 7.431  PCO2ART 71.9* 47.7 64.6*  PO2ART 66.0* 63.4* 71.1*  HCO3 42.1* 41.1* 42.2*  TCO2 44  --   --     CBG trend  Recent Labs Lab 05/17/16 1632 05/17/16 2011 05/17/16 2354 05/18/16 0407 05/18/16 0738  GLUCAP 293* 332* 307* 364* 323*     ASSESSMENT / PLAN:  PULMONARY  ASSESSMENT: Acute on chronic hypercarbic and hypoxemic respiratory failure 2/2 acute exacerbation of severe COPD in the setting of URI (Rhinovirus) + PSA  In trache aspirate + Pulmonary edema + OSA + OHS Hx of COPD ( FEV1/FVC 50%, FEV 28%. 05/2015)  Right pleural effusion  PLAN:   On full vent support. He is on 50% Fio2 and PEEP 10 and sats in 88-90%.  He easily gets agitated so he will be a quick wean. Otherwise, he will self extubate.  I think the main issue on admission was pulm edema worsened by Rhinovirus +/- PSA bronchits/PNA Will deescalate abx to fortaz and plan for 7 days (D4) Cont diuresis cont pulmicort BID and duoneb qid Will slowly taper off pred as he has been intermittently taking it since 02/2016. Decrease to pred 40 today  And taper off (Pharmacy will place order)    CARDIOVASCULAR  ASSESSMENT:  Atrial fibrillation.  PLAN:  Cardizem PO TID Lasix 80 mg TID    RENAL  ASSESSMENT:  Azotemia PLAN:   Monitor  Continue diuresis with lasix. Check lytes this pm   GASTROINTESTINAL  ASSESSMENT:   Constipation PLAN:   protonix qhs  Cont TF miralax BID.    HEMATOLOGIC  ASSESSMENT:  Leukocytosis likely 2/2 steroids PLAN:  subq lovenox    INFECTIOUS  ASSESSMENT:   Rhinovirus URI + PSA bronchitis/PNA. Recent 02/2016 PLAN:   Switch abx to fortaz and plan for 7 days for now (D4)   ENDOCRINE  ASSESSMENT:   Hx of type II DM on levemir 40 units qHS, metformin 1000 mg qHS, and pioglitazone at home    PLAN:   Will increase Levemir to  60 units daily from 40 units daily and change sliding scale to resistant.    NEUROLOGIC  ASSESSMENT:   AMS 2/2 hypercarbia, agitated  PLAN:   On fentanyl gtt  On propofol gtt  Versed PRN  RASS goal -1  He easily gets agitated and propofol seems  to control him the best.    I spent  30  minutes of Critical Care time with this patient today.   Family :   No family at bedside.    Pollie Meyer, MD 05/18/2016, 10:19 AM [facility] Pager [phone] After 3 pm or if no answer, call [phone]

## 2016-05-18 NOTE — Care Management Note (Signed)
Case Management Note  Patient Details  Name: Brett Curry MRN: [id number] Date of Birth: 03-03-1960  Subjective/Objective:  Pt admitted with RF                  Action/Plan:  PTA from home - discharge home with supplemental oxygen (CM requested [facility] to verify pt home liter flow ).  Pt is now ventilated .  CM will continue to follow for discharge needs   Expected Discharge Date:                  Expected Discharge Plan:     In-House Referral:     Discharge planning Services  CM Consult  Post Acute Care Choice:    Choice offered to:     DME Arranged:    DME Agency:     HH Arranged:    HH Agency:     Status of Service:     If discussed at [employer], dates discussed:    Additional Comments:  Cherylann Parr, RN 05/18/2016, 2:48 PM

## 2016-05-18 NOTE — Plan of Care (Signed)
Problem: Pain Managment: Goal: General experience of comfort will improve Outcome: Progressing Variance: Communication barriers

## 2016-05-18 NOTE — Progress Notes (Signed)
eLink Physician-Brief Progress Note Patient Name: Brett Curry DOB: [birth year] MRN: [id number]   Date of Service  05/18/2016  HPI/Events of Note  Agitation - Request to renew bilateral soft wrist restraints.   eICU Interventions  Will order: 1. Bilateral soft wrist restraints.      Intervention Category Minor Interventions: Agitation / anxiety - evaluation and management  Orvin Netter Eugene 05/18/2016, 5:56 PM

## 2016-05-18 NOTE — Progress Notes (Signed)
Pharmacy Antibiotic Note  Brett Curry is a 56 y.o. male admitted on 05/12/2016 with pneumonia, now with Pseudomonas growing in tracheal aspirate culture.  Pharmacy has been consulted for cefepime dosing.  Patient's SCr is drifting up.  He remains afebrile and his WBC is improving.  PCT negative.  Plan: - Increase cefepime to 2gm IV Q8H - Monitor renal fxn, micro data - Reduce Lovenox to  SQ Q24H given reduced weight   Height:  (175.3 cm) Weight: (!) 420 lb (190.5 kg) IBW/kg (Calculated) : 70.7  Temp (24hrs), Avg:98.3 F (36.8 C), Min:97.9 F (36.6 C), Max:98.7 F (37.1 C)   Recent Labs Lab 04/17/2016 0539 05/02/2016 0554  05/16/16 0252 05/16/16 1555 05/17/16 0225 05/17/16 1830 05/18/16 0318  WBC 13.0*  --   --  12.1*  --  11.5*  --  11.3*  CREATININE 0.66  --   < > 0.81 0.93 0.89 0.95 0.97  LATICACIDVEN  --  0.86  --   --   --   --   --   --   < > = values in this interval not displayed.  Estimated Creatinine Clearance: 142.6 mL/min (by C-G formula based on SCr of 0.97 mg/dL).    No Known Allergies   Vanc 4/29 >> 5/1 Cefepime 4/29 >>  4/29 MRSA PCR: negative 4/29 Strep pneumo: negative 4/29 Resp panel: + Rhinovirus/Enterovirus 4/29 BCx x2 - NGTD 4/29 TA - Pseudomnas    Zalen Sequeira D. Laney Potash, PharmD, BCPS Pager:  [phone] 05/18/2016, 7:39 AM

## 2016-05-18 NOTE — Progress Notes (Signed)
Upon first contact OGT was unable to be auscultated below diaphragm, but instead air could be heard escaping from mouth. OGT advanced and retaped; now air bolus is able to be auscultated below diaphragm in the abdominal LUQ.

## 2016-05-19 ENCOUNTER — Inpatient Hospital Stay (HOSPITAL_COMMUNITY): Payer: Medicare Other

## 2016-05-19 LAB — GLUCOSE, CAPILLARY
GLUCOSE-CAPILLARY: 114 mg/dL — AB (ref 65–99)
GLUCOSE-CAPILLARY: 116 mg/dL — AB (ref 65–99)
GLUCOSE-CAPILLARY: 142 mg/dL — AB (ref 65–99)
GLUCOSE-CAPILLARY: 145 mg/dL — AB (ref 65–99)
GLUCOSE-CAPILLARY: 147 mg/dL — AB (ref 65–99)
GLUCOSE-CAPILLARY: 150 mg/dL — AB (ref 65–99)
GLUCOSE-CAPILLARY: 152 mg/dL — AB (ref 65–99)
GLUCOSE-CAPILLARY: 153 mg/dL — AB (ref 65–99)
GLUCOSE-CAPILLARY: 154 mg/dL — AB (ref 65–99)
GLUCOSE-CAPILLARY: 164 mg/dL — AB (ref 65–99)
GLUCOSE-CAPILLARY: 176 mg/dL — AB (ref 65–99)
GLUCOSE-CAPILLARY: 176 mg/dL — AB (ref 65–99)
GLUCOSE-CAPILLARY: 201 mg/dL — AB (ref 65–99)
GLUCOSE-CAPILLARY: 213 mg/dL — AB (ref 65–99)
Glucose-Capillary: 169 mg/dL — ABNORMAL HIGH (ref 65–99)
Glucose-Capillary: 114 mg/dL — ABNORMAL HIGH (ref 65–99)
Glucose-Capillary: 124 mg/dL — ABNORMAL HIGH (ref 65–99)
Glucose-Capillary: 144 mg/dL — ABNORMAL HIGH (ref 65–99)
Glucose-Capillary: 147 mg/dL — ABNORMAL HIGH (ref 65–99)
Glucose-Capillary: 167 mg/dL — ABNORMAL HIGH (ref 65–99)
Glucose-Capillary: 199 mg/dL — ABNORMAL HIGH (ref 65–99)
Glucose-Capillary: 161 mg/dL — ABNORMAL HIGH (ref 65–99)
Glucose-Capillary: 159 mg/dL — ABNORMAL HIGH (ref 65–99)

## 2016-05-19 LAB — BASIC METABOLIC PANEL
ANION GAP: 10 (ref 5–15)
Anion gap: 7 (ref 5–15)
BUN: 63 mg/dL — ABNORMAL HIGH (ref 6–20)
BUN: 69 mg/dL — AB (ref 6–20)
CALCIUM: 9 mg/dL (ref 8.9–10.3)
CHLORIDE: 87 mmol/L — AB (ref 101–111)
CO2: 44 mmol/L — ABNORMAL HIGH (ref 22–32)
CO2: 46 mmol/L — AB (ref 22–32)
CREATININE: 0.92 mg/dL (ref 0.61–1.24)
CREATININE: 1.01 mg/dL (ref 0.61–1.24)
Calcium: 9.2 mg/dL (ref 8.9–10.3)
Chloride: 90 mmol/L — ABNORMAL LOW (ref 101–111)
GFR calc Af Amer: >60 mL/min (ref >60)
GFR calc non Af Amer: >60 mL/min (ref >60)
GFR calc non Af Amer: >60 mL/min (ref >60)
GLUCOSE: 205 mg/dL — AB (ref 65–99)
Glucose, Bld: 117 mg/dL — ABNORMAL HIGH (ref 65–99)
Potassium: 3.4 mmol/L — ABNORMAL LOW (ref 3.5–5.1)
Potassium: 4.7 mmol/L (ref 3.5–5.1)
Sodium: 141 mmol/L (ref 135–145)
Sodium: 143 mmol/L (ref 135–145)

## 2016-05-19 LAB — CBC
HCT: 40.7 % (ref 39.0–52.0)
HEMOGLOBIN: 12.2 g/dL — AB (ref 13.0–17.0)
MCH: 28.5 pg (ref 26.0–34.0)
MCHC: 30 g/dL (ref 30.0–36.0)
MCV: 95.1 fL (ref 78.0–100.0)
PLATELETS: 145 10*3/uL — AB (ref 150–400)
RBC: 4.28 MIL/uL (ref 4.22–5.81)
RDW: 15.9 % — ABNORMAL HIGH (ref 11.5–15.5)
WBC: 10.4 10*3/uL (ref 4.0–10.5)

## 2016-05-19 LAB — MAGNESIUM: MAGNESIUM: 2.1 mg/dL (ref 1.7–2.4)

## 2016-05-19 LAB — PHOSPHORUS: PHOSPHORUS: 4.5 mg/dL (ref 2.5–4.6)

## 2016-05-19 MED ORDER — POTASSIUM CHLORIDE 20 MEQ/15ML (10%) PO SOLN
40.0000 meq | Freq: Once | ORAL | Status: AC
  Administered 2016-05-19: 40 meq via ORAL
  Filled 2016-05-19: qty 30

## 2016-05-19 MED ORDER — BISACODYL 10 MG RE SUPP
10.0000 mg | Freq: Every day | RECTAL | Status: DC | PRN
  Administered 2016-05-20: 10 mg via RECTAL
  Filled 2016-05-19: qty 1

## 2016-05-19 MED ORDER — FUROSEMIDE 10 MG/ML IJ SOLN
40.0000 mg | Freq: Three times a day (TID) | INTRAMUSCULAR | Status: DC
  Administered 2016-05-19 – 2016-05-20 (×3): 40 mg via INTRAVENOUS
  Filled 2016-05-19 (×3): qty 4

## 2016-05-19 NOTE — Progress Notes (Signed)
PULMONARY  / CRITICAL CARE MEDICINE  Name: Brett Curry MRN: [id number] DOB: 05/01/1960    LOS: 4  REFERRING MD :  Dr. Manus Gunningancour, EDP  CHIEF COMPLAINT:  Respiratory distress  BRIEF PATIENT DESCRIPTION:  56 year old man with PMH asthma, afib on anticoag, morbid obesity, COPD on continuous O2 at home, HLD, uncontrolled type II DM, presented via EMS to [facility] ED with the complaint of respiratory distress for a few days. With EMS he was found to be in Afib with RVR and had wheezing. His mental status worsened during transport, he was given 2 nebs and solumedrol by EMS. Upon arrival to the ED he was in extremis with respiratory distress, obtunded and unable to provide a history. His O2 sat was 83% and improved to 99% with oxygen. He was afebrile and BP was ok. He was intubated due to hypercarbic respiratory failure and altered mental status, ABG showed pH 7.118, CO2 above reportable range for lab, and PO2 178. Chest xray showed interstitial edema and large right pleural effusion. He received 40 mg IV lasix, magnesium, and cardizem gtt for afib with rvr.    LINES / TUBES: OG tube 4/29>> Urethral catheter 4/29 >> Peripheral IV left antecubital, right antecubital, right posterior antecubital 4/29>>  ETT 4/29 >>  R PICC 5/1 >   CULTURES: MRSA - negative  Respiratory culture >> PSA Blood cultures >> (-)  ANTIBIOTICS: Cefepime 4/29 > 52 Vancomycin 4/29 > 5/1 Fortaz 5/2   SIGNIFICANT EVENTS:  4/29 > admitted for respiratory failure   VITAL SIGNS: Temp:  [97.2 F (36.2 C)] 97.2 F (36.2 C) (05/02 2001) Pulse Rate:  [54-210] 105 (05/03 0741) Resp:  [17-39] 20 (05/03 0741) BP: (103-128)/(66-87) 121/67 (05/03 0741) SpO2:  [88 %-96 %] 93 % (05/03 0741) FiO2 (%):  [40 %-50 %] 40 % (05/03 0741) Weight:  [193.7 kg (427 lb)] 193.7 kg (427 lb) (05/03 0200) HEMODYNAMICS:   VENTILATOR SETTINGS: Vent Mode: PRVC FiO2 (%):  [40 %-50 %] 40 % Set Rate:  [20 bmp] 20 bmp Vt Set:  [500 mL] 500  mL PEEP:  [5 cmH20-10 cmH20] 10 cmH20 Plateau Pressure:  [23 cmH20-25 cmH20] 23 cmH20 INTAKE / OUTPUT: Intake/Output      05/02 0701 - 05/03 0700 05/03 0701 - 05/04 0700   I.V. (mL/kg) 2318.4 (12)    NG/GT 520    IV Piggyback 150    Total Intake(mL/kg) 2988.4 (15.4)    Urine (mL/kg/hr) 5255 (1.1)    Emesis/NG output     Total Output 5255     Net -2266.6            PHYSICAL EXAMINATION: General: Morbidly obese male, sedated, NAD. Easily arousable and follows simple commands Neuro:  Sedated CN grossly intact HEENT: PERRLA  Cardiovascular:  Tachycardic, irregularly irregular rhythm, less edema.   Lungs:  Intubated . Dec BS on BLF. Crackles at bases.  Abdomen: BS+, soft, distended,  non tender Musculoskeletal:  Normal tone Skin:  Intact, warm, and dry . Still with edema but less.    LABS: Cbc  Recent Labs Lab 05/17/16 0225 05/18/16 0318 05/19/16 0334  WBC 11.5* 11.3* 10.4  HGB 12.0* 12.0* 12.2*  HCT 39.6 37.8* 40.7  PLT 153 149* 145*    Chemistry   Recent Labs Lab 05/17/16 0225  05/18/16 0318 05/18/16 1709 05/19/16 0334  NA 138  < > 131* 134* 141  K 3.5  < > 4.0 3.9 3.4*  CL 90*  < > 84*  85* 87*  CO2 39*  < > 32 41* 44*  BUN 31*  < > 49* 59* 63*  CREATININE 0.89  < > 0.97 1.03 0.92  CALCIUM 8.9  < > 8.2* 8.7* 9.0  MG 2.2  --  1.9  --  2.1  PHOS 6.2*  --  5.8*  --  4.5  GLUCOSE 171*  < > 349* 409* 117*  < > = values in this interval not displayed.  Liver fxn No results for input(s): AST, ALT, ALKPHOS, BILITOT, PROT, ALBUMIN in the last 168 hours. coags No results for input(s): APTT, INR in the last 168 hours. Sepsis markers  Recent Labs Lab 27-May-2016 0554  05/16/16 0252 05/17/16 0225 05/18/16 0318  LATICACIDVEN 0.86  --   --   --   --   PROCALCITON  --   < > <0.10 <0.10 <0.10  < > = values in this interval not displayed. Cardiac markers  Recent Labs Lab 2016-05-27 1001 2016/05/27 1550 05-27-2016 2244  TROPONINI <0.03 <0.03 <0.03   BNP No  results for input(s): PROBNP in the last 168 hours. ABG  Recent Labs Lab 05/27/2016 1016 05/16/16 0348 05/16/16 1524  PHART 7.375 7.545* 7.431  PCO2ART 71.9* 47.7 64.6*  PO2ART 66.0* 63.4* 71.1*  HCO3 42.1* 41.1* 42.2*  TCO2 44  --   --     CBG trend  Recent Labs Lab 05/19/16 0433 05/19/16 0539 05/19/16 0641 05/19/16 0737 05/19/16 0842  GLUCAP 116* 124* 144* 147* 164*     ASSESSMENT / PLAN:  PULMONARY  ASSESSMENT: Acute on chronic hypercarbic and hypoxemic respiratory failure 2/2 acute exacerbation of severe COPD in the setting of URI (Rhinovirus) + PSA  In trache aspirate/RML HAP  + Pulmonary edema + OSA + OHS Hx of COPD ( FEV1/FVC 50%, FEV 28%. 05/2015)  Right pleural effusion  PLAN:   On full vent support. He is now on 40% Fio2 and PEEP 8 and sats in 88-90%.  He easily gets agitated so he will be a quick wean. Otherwise, he will self extubate.  I think the main issue on admission was pulm edema worsened by Rhinovirus + PSA /PNA Cont ortaz and plan for 7 days (D5) Cont diuresis but dec to 40 TID from 80 TID cont pulmicort BID and duoneb qid Will slowly taper off pred as he has been intermittently taking it since 02/2016. Decrease to pred 40 today  And taper off (Pharmacy will place order)    CARDIOVASCULAR  ASSESSMENT:  Atrial fibrillation.  PLAN:  Cardizem PO TID Lasix 40 mg TID    RENAL  ASSESSMENT:  Azotemia PLAN:   Monitor  Continue diuresis with lasix but will decrease dose to 40 TID Check lytes this pm   GASTROINTESTINAL  ASSESSMENT:   Constipation PLAN:   protonix qhs  Cont TF miralax BID. If (-) BM this pm, give suppository   HEMATOLOGIC  ASSESSMENT:  Leukocytosis likely 2/2 steroids + HAP  PLAN:  subq lovenox    INFECTIOUS  ASSESSMENT:   Rhinovirus URI + PSA PNA. Recent admit in 02/2016 PLAN:   On fortaz and plan for 7 days for (D5)   ENDOCRINE  ASSESSMENT:   Hx of type II DM on levemir 40 units qHS, metformin 1000 mg  qHS, and pioglitazone at home    PLAN:   Keep on insulin drip   NEUROLOGIC  ASSESSMENT:   AMS 2/2 hypercarbia, agitated  PLAN:   On fentanyl gtt  On propofol gtt  Versed  PRN  RASS goal -1  He easily gets agitated and propofol seems to control him the best.    I spent  30  minutes of Critical Care time with this patient today.   Family :   No family at bedside.    Pollie Meyer, MD 05/19/2016, 9:04 AM [facility] Pager [phone] After 3 pm or if no answer, call [phone]

## 2016-05-19 NOTE — Progress Notes (Signed)
eLink Physician-Brief Progress Note Patient Name: Narda BondsRandy P Herda DOB: 09-25-1960 MRN: [id number]   Date of Service  05/19/2016  HPI/Events of Note  Agitation - Request to renew bilateral soft wrist restraints.   eICU Interventions  Will renew bilateral soft wrist restraints.      Intervention Category Minor Interventions: Agitation / anxiety - evaluation and management  Keiana Tavella Dennard Nipugene 05/19/2016, 7:57 PM

## 2016-05-20 ENCOUNTER — Inpatient Hospital Stay (HOSPITAL_COMMUNITY): Payer: Medicare Other

## 2016-05-20 LAB — CBC
HEMATOCRIT: 43.2 % (ref 39.0–52.0)
HEMOGLOBIN: 12.4 g/dL — AB (ref 13.0–17.0)
MCH: 28.1 pg (ref 26.0–34.0)
MCHC: 28.7 g/dL — ABNORMAL LOW (ref 30.0–36.0)
MCV: 98 fL (ref 78.0–100.0)
PLATELETS: 139 10*3/uL — AB (ref 150–400)
RBC: 4.41 MIL/uL (ref 4.22–5.81)
RDW: 16.2 % — AB (ref 11.5–15.5)
WBC: 10.7 10*3/uL — ABNORMAL HIGH (ref 4.0–10.5)

## 2016-05-20 LAB — LEGIONELLA PNEUMOPHILA SEROGP 1 UR AG: L. PNEUMOPHILA SEROGP 1 UR AG: NEGATIVE

## 2016-05-20 LAB — GLUCOSE, CAPILLARY
GLUCOSE-CAPILLARY: 128 mg/dL — AB (ref 65–99)
GLUCOSE-CAPILLARY: 305 mg/dL — AB (ref 65–99)
Glucose-Capillary: 120 mg/dL — ABNORMAL HIGH (ref 65–99)
Glucose-Capillary: 120 mg/dL — ABNORMAL HIGH (ref 65–99)
Glucose-Capillary: 143 mg/dL — ABNORMAL HIGH (ref 65–99)
Glucose-Capillary: 145 mg/dL — ABNORMAL HIGH (ref 65–99)
Glucose-Capillary: 149 mg/dL — ABNORMAL HIGH (ref 65–99)
Glucose-Capillary: 215 mg/dL — ABNORMAL HIGH (ref 65–99)
Glucose-Capillary: 299 mg/dL — ABNORMAL HIGH (ref 65–99)
Glucose-Capillary: 304 mg/dL — ABNORMAL HIGH (ref 65–99)
Glucose-Capillary: 311 mg/dL — ABNORMAL HIGH (ref 65–99)

## 2016-05-20 LAB — BASIC METABOLIC PANEL
ANION GAP: 10 (ref 5–15)
BUN: 72 mg/dL — ABNORMAL HIGH (ref 6–20)
CHLORIDE: 87 mmol/L — AB (ref 101–111)
CO2: 49 mmol/L — AB (ref 22–32)
Calcium: 9.2 mg/dL (ref 8.9–10.3)
Creatinine, Ser: 0.91 mg/dL (ref 0.61–1.24)
GFR calc non Af Amer: >60 mL/min (ref >60)
GLUCOSE: 127 mg/dL — AB (ref 65–99)
Potassium: 3.9 mmol/L (ref 3.5–5.1)
Sodium: 146 mmol/L — ABNORMAL HIGH (ref 135–145)

## 2016-05-20 LAB — CULTURE, BLOOD (ROUTINE X 2)
CULTURE: NO GROWTH
CULTURE: NO GROWTH
SPECIAL REQUESTS: ADEQUATE
Special Requests: ADEQUATE

## 2016-05-20 LAB — MAGNESIUM: Magnesium: 2.2 mg/dL (ref 1.7–2.4)

## 2016-05-20 LAB — PHOSPHORUS: Phosphorus: 3.3 mg/dL (ref 2.5–4.6)

## 2016-05-20 MED ORDER — NYSTATIN 100000 UNIT/ML MT SUSP
5.0000 mL | Freq: Four times a day (QID) | OROMUCOSAL | Status: DC
  Administered 2016-05-20 – 2016-05-27 (×28): 500000 [IU] via ORAL
  Filled 2016-05-20 (×30): qty 5

## 2016-05-20 MED ORDER — POTASSIUM CHLORIDE 20 MEQ/15ML (10%) PO SOLN
20.0000 meq | Freq: Once | ORAL | Status: AC
  Administered 2016-05-20: 20 meq
  Filled 2016-05-20: qty 15

## 2016-05-20 MED ORDER — INSULIN GLARGINE 100 UNIT/ML ~~LOC~~ SOLN
25.0000 [IU] | Freq: Every day | SUBCUTANEOUS | Status: DC
  Administered 2016-05-21: 25 [IU] via SUBCUTANEOUS
  Filled 2016-05-20: qty 0.25

## 2016-05-20 MED ORDER — DEXTROSE 10 % IV SOLN: INTRAVENOUS | Status: DC | PRN

## 2016-05-20 MED ORDER — INSULIN GLARGINE 100 UNIT/ML ~~LOC~~ SOLN
25.0000 [IU] | SUBCUTANEOUS | Status: DC
  Administered 2016-05-20: 25 [IU] via SUBCUTANEOUS
  Filled 2016-05-20: qty 0.25

## 2016-05-20 MED ORDER — INSULIN ASPART 100 UNIT/ML ~~LOC~~ SOLN
4.0000 [IU] | SUBCUTANEOUS | Status: DC
  Administered 2016-05-20 (×2): 4 [IU] via SUBCUTANEOUS

## 2016-05-20 MED ORDER — LACTULOSE 10 GM/15ML PO SOLN
10.0000 g | ORAL | Status: DC | PRN
  Administered 2016-05-20 – 2016-05-23 (×3): 10 g
  Filled 2016-05-20 (×4): qty 15

## 2016-05-20 MED ORDER — INSULIN ASPART 100 UNIT/ML ~~LOC~~ SOLN
0.0000 [IU] | SUBCUTANEOUS | Status: DC
Start: 2016-05-20 — End: 2016-05-21
  Administered 2016-05-20: 9 [IU] via SUBCUTANEOUS
  Administered 2016-05-20 (×2): 7 [IU] via SUBCUTANEOUS
  Administered 2016-05-21: 3 [IU] via SUBCUTANEOUS
  Administered 2016-05-21: 5 [IU] via SUBCUTANEOUS

## 2016-05-20 MED ORDER — INSULIN ASPART 100 UNIT/ML ~~LOC~~ SOLN
2.0000 [IU] | SUBCUTANEOUS | Status: DC
  Administered 2016-05-20 (×2): 6 [IU] via SUBCUTANEOUS

## 2016-05-20 NOTE — Progress Notes (Signed)
CSW received consult for "Daughter needs FMLA paperwork filled out". Patient's daughter will need to take FMLA paperwork to:   [facility] [address], [place], [place] [address] [phone]  CSW signing off. Please consult should new need(s) arise.    Enos FlingAshley Jaymes Hang, MSW, LCSW [facility] Clinical Social Worker [phone]

## 2016-05-20 NOTE — Progress Notes (Signed)
eLink Physician-Brief Progress Note [facility]: 09-03-1960 MRN: [id number]   Date of Service  05/20/2016  HPI/Events of Note  Hyperglycemia - Blood glucose = 304. Currently on Lantus 25 units [place] Q day and standard Q 4 hour Novolog SSI.  eICU Interventions  Will change to Q 4 hour sensitive Novolog SSI.     Intervention Category Major Interventions: Hyperglycemia - active titration of insulin therapy  Lenell AntuSommer,Remedy Corporan [place] 05/20/2016, 4:27 PM

## 2016-05-20 NOTE — Progress Notes (Signed)
PULMONARY  / CRITICAL CARE MEDICINE  Name: Brett Curry MRN: [id number] DOB: 06/09/1960    LOS: 5  REFERRING MD :  Dr. Manus Gunning, EDP  CHIEF COMPLAINT:  Respiratory distress  BRIEF PATIENT DESCRIPTION:  56 year old man with PMH asthma, afib on anticoag, morbid obesity, COPD on continuous O2 at home, HLD, uncontrolled type II DM, presented via EMS to [facility] ED with the complaint of respiratory distress for a few days. With EMS he was found to be in Afib with RVR and had wheezing. His mental status worsened during transport, he was given 2 nebs and solumedrol by EMS. Upon arrival to the ED he was in extremis with respiratory distress, obtunded and unable to provide a history. His O2 sat was 83% and improved to 99% with oxygen. He was afebrile and BP was ok. He was intubated due to hypercarbic respiratory failure and altered mental status, ABG showed pH 7.118, CO2 above reportable range for lab, and PO2 178. Chest xray showed interstitial edema and large right pleural effusion. He received 40 mg IV lasix, magnesium, and cardizem gtt for afib with rvr.    LINES / TUBES: OG tube 4/29>> Urethral catheter 4/29 >> Peripheral IV left antecubital, right antecubital, right posterior antecubital 4/29>>  ETT 4/29 >>  R PICC 5/1 >   CULTURES: MRSA - negative  Respiratory culture >> PSA Blood cultures >> (-)  ANTIBIOTICS: Cefepime 4/29 > 52 Vancomycin 4/29 > 5/1 Fortaz 5/2   SIGNIFICANT EVENTS:  4/29 > admitted for respiratory failure   VITAL SIGNS: Temp:  [98.8 F (37.1 C)-99.1 F (37.3 C)] 99.1 F (37.3 C) (05/04 0400) Pulse Rate:  [101-120] 112 (05/04 1100) Resp:  [20-43] 21 (05/04 1100) BP: (101-125)/(60-87) 112/71 (05/04 1100) SpO2:  [88 %-94 %] 88 % (05/04 1100) FiO2 (%):  [40 %-50 %] 45 % (05/04 0927) Weight:  [180.1 kg (397 lb)] 180.1 kg (397 lb) (05/04 0400) HEMODYNAMICS:   VENTILATOR SETTINGS: Vent Mode: PRVC FiO2 (%):  [40 %-50 %] 45 % Set Rate:  [20 bmp] 20 bmp Vt  Set:  [500 mL] 500 mL PEEP:  [8 cmH20] 8 cmH20 Plateau Pressure:  [20 cmH20-28 cmH20] 20 cmH20 INTAKE / OUTPUT: Intake/Output      05/03 0701 - 05/04 0700 05/04 0701 - 05/05 0700   P.O.  700   I.V. (mL/kg) 1581 (8.8) 175.8 (1)   Other 10 10   NG/GT 330 375   IV Piggyback 50    Total Intake(mL/kg) 1971 (10.9) 1260.8 (7)   Urine (mL/kg/hr) 4275 (1) 1865 (2.4)   Total Output 4275 1865   Net -2304 -604.2          PHYSICAL EXAMINATION: General: Morbidly obese male, sedated, NAD. Easily arousable and follows simple commands Neuro:  Sedated CN grossly intact HEENT: PERRLA  Cardiovascular:  Tachycardic, irregularly irregular rhythm, less edema.   Lungs:  Intubated . Dec BS on BLF. Crackles at bases.  Abdomen: BS+, soft, distended,  non tender Musculoskeletal:  Normal tone Skin:  Intact, warm, and dry . Still with edema but significantly less.    LABS: Cbc  Recent Labs Lab 05/18/16 0318 05/19/16 0334 05/20/16 0430  WBC 11.3* 10.4 10.7*  HGB 12.0* 12.2* 12.4*  HCT 37.8* 40.7 43.2  PLT 149* 145* 139*    Chemistry   Recent Labs Lab 05/18/16 0318  05/19/16 0334 05/19/16 1453 05/20/16 0430  NA 131*  < > 141 143 146*  K 4.0  < > 3.4* 4.7  3.9  CL 84*  < > 87* 90* 87*  CO2 32  < > 44* 46* 49*  BUN 49*  < > 63* 69* 72*  CREATININE 0.97  < > 0.92 1.01 0.91  CALCIUM 8.2*  < > 9.0 9.2 9.2  MG 1.9  --  2.1  --  2.2  PHOS 5.8*  --  4.5  --  3.3  GLUCOSE 349*  < > 117* 205* 127*  < > = values in this interval not displayed.  Liver fxn No results for input(s): AST, ALT, ALKPHOS, BILITOT, PROT, ALBUMIN in the last 168 hours. coags No results for input(s): APTT, INR in the last 168 hours. Sepsis markers  Recent Labs Lab 05/07/2016 0554  05/16/16 0252 05/17/16 0225 05/18/16 0318  LATICACIDVEN 0.86  --   --   --   --   PROCALCITON  --   < > <0.10 <0.10 <0.10  < > = values in this interval not displayed. Cardiac markers  Recent Labs Lab 04/27/2016 1001 04/30/2016 1550  05/16/2016 2244  TROPONINI <0.03 <0.03 <0.03   BNP No results for input(s): PROBNP in the last 168 hours. ABG  Recent Labs Lab 04/22/2016 1016 05/16/16 0348 05/16/16 1524  PHART 7.375 7.545* 7.431  PCO2ART 71.9* 47.7 64.6*  PO2ART 66.0* 63.4* 71.1*  HCO3 42.1* 41.1* 42.2*  TCO2 44  --   --     CBG trend  Recent Labs Lab 05/20/16 0254 05/20/16 0346 05/20/16 0439 05/20/16 0528 05/20/16 0750  GLUCAP 128* 120* 120* 145* 215*     ASSESSMENT / PLAN:  PULMONARY  ASSESSMENT: Acute on chronic hypercarbic and hypoxemic respiratory failure 2/2 acute exacerbation of severe COPD in the setting of URI (Rhinovirus) + PSA  In trache aspirate/RML HAP  + Pulmonary edema + OSA + OHS Hx of COPD ( FEV1/FVC 50%, FEV 28%. 05/2015)  Right pleural effusion  PLAN:   On full vent support. He is now on 45% Fio2 and PEEP 8 and sats in 88-90%. Need to determine baseline o2 requirment. Allegedly, he was on 7L O2 at home. He is improved.  He is on abx and has diuresced well. (-) 6.7 L. He has tachycardia likely related to hypovolemia now.  Daily PST Cont Fortaz and plan for 7 days (D6) Hold off diuresis on 5/4 cont pulmicort BID and duoneb qid Will slowly taper off pred as he has been intermittently taking it since 02/2016. Decrease to pred 40 today  And taper off over 1 week (ordered)    CARDIOVASCULAR  ASSESSMENT:  Atrial fibrillation.  Sinus tachycardia worse last 24 hrs likelt from hypovolemia.  PLAN:  Cardizem PO QID Hold off diuretics 5/4   RENAL  ASSESSMENT:  Azotemia PLAN:   Monitor  Hold off on diuretics 5/4.  He is (-) 7 L. Got lasix at 6 am   GASTROINTESTINAL  ASSESSMENT:   Constipation PLAN:   protonix qhs  Cont TF miralax BID. Start lactulose 5/4   HEMATOLOGIC  ASSESSMENT:  Leukocytosis likely 2/2 steroids + HAP  PLAN:  subq lovenox    INFECTIOUS  ASSESSMENT:   Rhinovirus URI + PSA PNA. Recent admit in 02/2016 PLAN:   On fortaz and plan for 7 days  for (D6)   ENDOCRINE  ASSESSMENT:   Hx of type II DM on levemir 40 units qHS, metformin 1000 mg qHS, and pioglitazone at home    PLAN:   Keep on insulin drip   NEUROLOGIC  ASSESSMENT:   AMS 2/2  hypercarbia, agitated  PLAN:   On fentanyl gtt  On propofol gtt  Versed PRN  RASS goal -1  He easily gets agitated and propofol seems to control him the best.    I spent  30  minutes of Critical Care time with this patient today.   Family :   No family at bedside.    Pollie MeyerJ. Angelo A de Dios, MD 05/20/2016, 11:17 AM [facility] Pager [phone] After 3 pm or if no answer, call [phone]

## 2016-05-21 ENCOUNTER — Inpatient Hospital Stay (HOSPITAL_COMMUNITY): Payer: Medicare Other

## 2016-05-21 DIAGNOSIS — J181 Lobar pneumonia, unspecified organism: Secondary | ICD-10-CM

## 2016-05-21 LAB — BASIC METABOLIC PANEL
ANION GAP: 9 (ref 5–15)
BUN: 59 mg/dL — AB (ref 6–20)
CALCIUM: 9.7 mg/dL (ref 8.9–10.3)
CO2: 50 mmol/L — AB (ref 22–32)
Chloride: 91 mmol/L — ABNORMAL LOW (ref 101–111)
Creatinine, Ser: 0.7 mg/dL (ref 0.61–1.24)
GFR calc Af Amer: >60 mL/min (ref >60)
GLUCOSE: 218 mg/dL — AB (ref 65–99)
Potassium: 3.3 mmol/L — ABNORMAL LOW (ref 3.5–5.1)
Sodium: 150 mmol/L — ABNORMAL HIGH (ref 135–145)

## 2016-05-21 LAB — GLUCOSE, CAPILLARY
GLUCOSE-CAPILLARY: 213 mg/dL — AB (ref 65–99)
Glucose-Capillary: 199 mg/dL — ABNORMAL HIGH (ref 65–99)
Glucose-Capillary: 261 mg/dL — ABNORMAL HIGH (ref 65–99)
Glucose-Capillary: 265 mg/dL — ABNORMAL HIGH (ref 65–99)
Glucose-Capillary: 279 mg/dL — ABNORMAL HIGH (ref 65–99)
Glucose-Capillary: 302 mg/dL — ABNORMAL HIGH (ref 65–99)

## 2016-05-21 LAB — CBC
HEMATOCRIT: 44.2 % (ref 39.0–52.0)
HEMOGLOBIN: 12.6 g/dL — AB (ref 13.0–17.0)
MCH: 28.6 pg (ref 26.0–34.0)
MCHC: 28.5 g/dL — ABNORMAL LOW (ref 30.0–36.0)
MCV: 100.2 fL — AB (ref 78.0–100.0)
Platelets: 120 10*3/uL — ABNORMAL LOW (ref 150–400)
RBC: 4.41 MIL/uL (ref 4.22–5.81)
RDW: 16.2 % — AB (ref 11.5–15.5)
WBC: 13 10*3/uL — AB (ref 4.0–10.5)

## 2016-05-21 LAB — TRIGLYCERIDES: Triglycerides: 184 mg/dL — ABNORMAL HIGH (ref <150)

## 2016-05-21 LAB — PHOSPHORUS: PHOSPHORUS: 3.4 mg/dL (ref 2.5–4.6)

## 2016-05-21 LAB — MAGNESIUM: MAGNESIUM: 2.2 mg/dL (ref 1.7–2.4)

## 2016-05-21 MED ORDER — FUROSEMIDE 10 MG/ML IJ SOLN
40.0000 mg | Freq: Every day | INTRAMUSCULAR | Status: DC
  Administered 2016-05-21 – 2016-05-22 (×2): 40 mg via INTRAVENOUS
  Filled 2016-05-21: qty 4

## 2016-05-21 MED ORDER — POTASSIUM CHLORIDE 20 MEQ/15ML (10%) PO SOLN
40.0000 meq | Freq: Once | ORAL | Status: AC
  Administered 2016-05-21: 40 meq via ORAL
  Filled 2016-05-21: qty 30

## 2016-05-21 MED ORDER — INSULIN ASPART 100 UNIT/ML ~~LOC~~ SOLN
0.0000 [IU] | SUBCUTANEOUS | Status: DC
  Administered 2016-05-21: 15 [IU] via SUBCUTANEOUS
  Administered 2016-05-21: 11 [IU] via SUBCUTANEOUS
  Administered 2016-05-21: 7 [IU] via SUBCUTANEOUS
  Administered 2016-05-22: 4 [IU] via SUBCUTANEOUS
  Administered 2016-05-22 (×2): 7 [IU] via SUBCUTANEOUS
  Administered 2016-05-22 (×2): 4 [IU] via SUBCUTANEOUS
  Administered 2016-05-22: 3 [IU] via SUBCUTANEOUS
  Administered 2016-05-23: 11 [IU] via SUBCUTANEOUS
  Administered 2016-05-23: 4 [IU] via SUBCUTANEOUS
  Administered 2016-05-23: 7 [IU] via SUBCUTANEOUS
  Administered 2016-05-23: 3 [IU] via SUBCUTANEOUS
  Administered 2016-05-23: 7 [IU] via SUBCUTANEOUS
  Administered 2016-05-23: 4 [IU] via SUBCUTANEOUS
  Administered 2016-05-24: 7 [IU] via SUBCUTANEOUS
  Administered 2016-05-24: 4 [IU] via SUBCUTANEOUS
  Administered 2016-05-24 (×2): 3 [IU] via SUBCUTANEOUS
  Administered 2016-05-24: 4 [IU] via SUBCUTANEOUS
  Administered 2016-05-24: 11 [IU] via SUBCUTANEOUS
  Administered 2016-05-25: 3 [IU] via SUBCUTANEOUS
  Administered 2016-05-25: 4 [IU] via SUBCUTANEOUS
  Administered 2016-05-25: 3 [IU] via SUBCUTANEOUS
  Administered 2016-05-25: 7 [IU] via SUBCUTANEOUS
  Administered 2016-05-25: 4 [IU] via SUBCUTANEOUS
  Administered 2016-05-26: 11 [IU] via SUBCUTANEOUS
  Administered 2016-05-26: 7 [IU] via SUBCUTANEOUS
  Administered 2016-05-26: 4 [IU] via SUBCUTANEOUS
  Administered 2016-05-26 (×2): 7 [IU] via SUBCUTANEOUS
  Administered 2016-05-26 (×2): 11 [IU] via SUBCUTANEOUS
  Administered 2016-05-27: 3 [IU] via SUBCUTANEOUS

## 2016-05-21 MED ORDER — INSULIN GLARGINE 100 UNIT/ML ~~LOC~~ SOLN
35.0000 [IU] | Freq: Every day | SUBCUTANEOUS | Status: DC
  Administered 2016-05-21 – 2016-05-25 (×5): 35 [IU] via SUBCUTANEOUS
  Filled 2016-05-21 (×5): qty 0.35

## 2016-05-21 MED ORDER — INSULIN GLARGINE 100 UNIT/ML ~~LOC~~ SOLN
10.0000 [IU] | Freq: Once | SUBCUTANEOUS | Status: AC
  Administered 2016-05-21: 10 [IU] via SUBCUTANEOUS
  Filled 2016-05-21: qty 0.1

## 2016-05-21 MED ORDER — FREE WATER
200.0000 mL | Freq: Four times a day (QID) | Status: DC
  Administered 2016-05-21 – 2016-05-24 (×12): 200 mL

## 2016-05-21 NOTE — Progress Notes (Signed)
PULMONARY  / CRITICAL CARE MEDICINE  Name: Brett Curry MRN: [id number] DOB: 13-Feb-1960    LOS: 6  REFERRING MD :  Dr. Manus Gunningancour, EDP  CHIEF COMPLAINT:  Respiratory distress  BRIEF PATIENT DESCRIPTION:  56 year old man with PMH asthma, afib on anticoag, morbid obesity, OSA (stopped cpap x 6mos), COPD on continuous O2 at home (per wife 7L x 1 yr), HLD, uncontrolled type II DM, presented via EMS to [facility] ED with the complaint of respiratory distress for a few days. With EMS he was found to be in Afib with RVR and had wheezing. His mental status worsened during transport, he was given 2 nebs and solumedrol by EMS. Upon arrival to the ED he was in extremis with respiratory distress, obtunded and unable to provide a history. His O2 sat was 83% and improved to 99% with oxygen. He was afebrile and BP was ok. He was intubated due to hypercarbic respiratory failure and altered mental status, ABG showed pH 7.118, CO2 above reportable range for lab, and PO2 178. Chest xray showed interstitial edema and large right pleural effusion. He received 40 mg IV lasix, magnesium, and cardizem gtt for afib with rvr.    LINES / TUBES: OG tube 4/29>> Urethral catheter 4/29 >> Peripheral IV left antecubital, right antecubital, right posterior antecubital 4/29>>  ETT 4/29 >>  R PICC 5/1 >   CULTURES: MRSA - negative  Respiratory culture >> PSA Blood cultures >> (-)  ANTIBIOTICS: Cefepime 4/29 > 52 Vancomycin 4/29 > 5/1 Fortaz 5/2   SIGNIFICANT EVENTS:  4/29 > admitted for respiratory failure   VITAL SIGNS: Temp:  [97.3 F (36.3 C)-98.2 F (36.8 C)] 98 F (36.7 C) (05/05 0748) Pulse Rate:  [80-127] 103 (05/05 0800) Resp:  [20-36] 24 (05/05 0800) BP: (92-123)/(51-84) 92/64 (05/05 0800) SpO2:  [88 %-95 %] 92 % (05/05 0800) FiO2 (%):  [45 %-50 %] 50 % (05/05 0800) Weight:  [406 kg (895 lb 1.1 oz)] 406 kg (895 lb 1.1 oz) (05/05 0100) HEMODYNAMICS:   VENTILATOR SETTINGS: Vent Mode: PRVC FiO2  (%):  [45 %-50 %] 50 % Set Rate:  [20 bmp] 20 bmp Vt Set:  [500 mL] 500 mL PEEP:  [8 cmH20] 8 cmH20 Plateau Pressure:  [20 cmH20-24 cmH20] 22 cmH20 INTAKE / OUTPUT: Intake/Output      05/04 0701 - 05/05 0700 05/05 0701 - 05/06 0700   P.O. 700    I.V. (mL/kg) 1502.6 (3.7) 60.8 (0.1)   Other 150 10   NG/GT 645 125   IV Piggyback 250    Total Intake(mL/kg) 3247.6 (8) 195.8 (0.5)   Urine (mL/kg/hr) 5280 (0.5) 255 (0.4)   Stool 0 (0)    Total Output 5280 255   Net -2032.4 -59.2        Stool Occurrence 2 x      PHYSICAL EXAMINATION: General: Morbidly obese male, sedated, NAD. Easily arousable and follows simple commands Neuro:  Sedated CN grossly intact HEENT: PERRLA  Cardiovascular:  Tachycardic, irregularly irregular rhythm, less edema.   Lungs:  Intubated . Dec BS on BLF. Crackles at bases.  Abdomen: BS+, soft, distended,  non tender Musculoskeletal:  Normal tone Skin:  Intact, warm, and dry . Still with edema but significantly less.    LABS: Cbc  Recent Labs Lab 05/19/16 0334 05/20/16 0430 05/21/16 0500  WBC 10.4 10.7* 13.0*  HGB 12.2* 12.4* 12.6*  HCT 40.7 43.2 44.2  PLT 145* 139* 120*    Chemistry   Recent  Labs Lab 05/19/16 0334 05/19/16 1453 05/20/16 0430 05/21/16 0420  NA 141 143 146* 150*  K 3.4* 4.7 3.9 3.3*  CL 87* 90* 87* 91*  CO2 44* 46* 49* 50*  BUN 63* 69* 72* 59*  CREATININE 0.92 1.01 0.91 0.70  CALCIUM 9.0 9.2 9.2 9.7  MG 2.1  --  2.2 2.2  PHOS 4.5  --  3.3 3.4  GLUCOSE 117* 205* 127* 218*    Liver fxn No results for input(s): AST, ALT, ALKPHOS, BILITOT, PROT, ALBUMIN in the last 168 hours. coags No results for input(s): APTT, INR in the last 168 hours. Sepsis markers  Recent Labs Lab May 24, 2016 0554  05/16/16 0252 05/17/16 0225 05/18/16 0318  LATICACIDVEN 0.86  --   --   --   --   PROCALCITON  --   < > <0.10 <0.10 <0.10  < > = values in this interval not displayed. Cardiac markers  Recent Labs Lab 05/24/2016 1001  05/24/16 1550 2016-05-24 2244  TROPONINI <0.03 <0.03 <0.03   BNP No results for input(s): PROBNP in the last 168 hours. ABG  Recent Labs Lab 24-May-2016 1016 05/16/16 0348 05/16/16 1524  PHART 7.375 7.545* 7.431  PCO2ART 71.9* 47.7 64.6*  PO2ART 66.0* 63.4* 71.1*  HCO3 42.1* 41.1* 42.2*  TCO2 44  --   --     CBG trend  Recent Labs Lab 05/20/16 1526 05/20/16 1955 05/20/16 2355 05/21/16 0334 05/21/16 0750  GLUCAP 304* 311* 305* 213* 279*     ASSESSMENT / PLAN:  PULMONARY  ASSESSMENT: Acute on chronic hypercarbic and hypoxemic respiratory failure 2/2 acute exacerbation of severe COPD in the setting of URI (Rhinovirus) + PSA  In trache aspirate/RML HAP  + Pulmonary edema + OSA (stopped cpap 6 mos ago) + OHS Hx of COPD ( FEV1/FVC 50%, FEV 28%. 05/2015)  Right pleural effusion  Baseline O2 at 7L per wife (at least the last year) PLAN:   On full vent support. He is now on 45% Fio2 and PEEP 8 and sats in 88-90%. Not ready for extubation.  He stopped using cpap x 6 mos ago for unknown reasons and he has all reasons to be on it.  He has been on O2 7L the last year.  Slowly getting more SOB + swelling.  We need to diuresce him dry and cont abx for now.  Cont Fortaz and plan for 7 days (D7) >> plan to d/c after today Decrease lasix to daily 2/2 hyper Na and tachycardia.  cont pulmicort BID and duoneb qid Will slowly taper off pred as he has been intermittently taking it since 02/2016. Decrease to pred 40 today  And taper off over 1 week (ordered) Her needs to be optimized prior to extubation as he may be non compliant with cpap.  I mentioned about a trache to wife. She wants to avoid if possible.    CARDIOVASCULAR  ASSESSMENT:  Atrial fibrillation.  Sinus tachycardia better with less diuresis PLAN:  Cardizem PO QID Cont lasix (lower dose)   RENAL  ASSESSMENT:  Azotemia Hypernatremia PLAN:   Monitor  Lasix daily Free water for hyper  Na   GASTROINTESTINAL  ASSESSMENT:   Constipation > had BM on 5/5 PLAN:   protonix qhs  Cont TF miralax BID, lactulose prn   HEMATOLOGIC  ASSESSMENT:  Leukocytosis likely 2/2 steroids + HAP PSA PLAN:  subq lovenox    INFECTIOUS  ASSESSMENT:   Rhinovirus URI + PSA PNA. Recent admit in 02/2016 PLAN:  On fortaz and plan for 7 days. D/c after todays dose   ENDOCRINE  ASSESSMENT:   Hx of type II DM on levemir 40 units qHS, metformin 1000 mg qHS, and pioglitazone at home    PLAN:   On and off insulin drip.  Currently off but sugars have been high.  Increase lantus to 35 u from 25 u  Will switch sliding scale to resistant.    NEUROLOGIC  ASSESSMENT:   AMS 2/2 hypercarbia, agitated  PLAN:   On fentanyl gtt  On propofol gtt  Versed PRN  RASS goal -1  He easily gets agitated and propofol seems to control him the best.    I spent  35  minutes of Critical Care time with this patient today.   Family :   Wife extensively updated on 5/5.    Pollie Meyer, MD 05/21/2016, 8:30 AM [facility] Pager [phone] After 3 pm or if no answer, call [phone]

## 2016-05-22 LAB — BASIC METABOLIC PANEL
ANION GAP: 6 (ref 5–15)
Anion gap: 13 (ref 5–15)
BUN: 48 mg/dL — ABNORMAL HIGH (ref 6–20)
BUN: 46 mg/dL — ABNORMAL HIGH (ref 6–20)
CALCIUM: 8.7 mg/dL — AB (ref 8.9–10.3)
CALCIUM: 9.3 mg/dL (ref 8.9–10.3)
CHLORIDE: 88 mmol/L — AB (ref 101–111)
CO2: 38 mmol/L — ABNORMAL HIGH (ref 22–32)
CO2: 48 mmol/L — ABNORMAL HIGH (ref 22–32)
CREATININE: 0.58 mg/dL — AB (ref 0.61–1.24)
CREATININE: 0.69 mg/dL (ref 0.61–1.24)
Chloride: 89 mmol/L — ABNORMAL LOW (ref 101–111)
GFR calc non Af Amer: >60 mL/min (ref >60)
GLUCOSE: 211 mg/dL — AB (ref 65–99)
Glucose, Bld: 190 mg/dL — ABNORMAL HIGH (ref 65–99)
Potassium: 3.9 mmol/L (ref 3.5–5.1)
Potassium: 3.9 mmol/L (ref 3.5–5.1)
SODIUM: 139 mmol/L (ref 135–145)
Sodium: 143 mmol/L (ref 135–145)

## 2016-05-22 LAB — GLUCOSE, CAPILLARY
GLUCOSE-CAPILLARY: 137 mg/dL — AB (ref 65–99)
GLUCOSE-CAPILLARY: 248 mg/dL — AB (ref 65–99)
Glucose-Capillary: 181 mg/dL — ABNORMAL HIGH (ref 65–99)
Glucose-Capillary: 221 mg/dL — ABNORMAL HIGH (ref 65–99)
Glucose-Capillary: 198 mg/dL — ABNORMAL HIGH (ref 65–99)

## 2016-05-22 LAB — CBC
HCT: 43.9 % (ref 39.0–52.0)
HEMOGLOBIN: 12.9 g/dL — AB (ref 13.0–17.0)
MCH: 29.8 pg (ref 26.0–34.0)
MCHC: 29.4 g/dL — AB (ref 30.0–36.0)
MCV: 101.4 fL — ABNORMAL HIGH (ref 78.0–100.0)
Platelets: 137 10*3/uL — ABNORMAL LOW (ref 150–400)
RBC: 4.33 MIL/uL (ref 4.22–5.81)
RDW: 16.1 % — ABNORMAL HIGH (ref 11.5–15.5)
WBC: 13 10*3/uL — ABNORMAL HIGH (ref 4.0–10.5)

## 2016-05-22 LAB — MAGNESIUM: MAGNESIUM: 1.8 mg/dL (ref 1.7–2.4)

## 2016-05-22 LAB — PHOSPHORUS: PHOSPHORUS: 3.7 mg/dL (ref 2.5–4.6)

## 2016-05-22 MED ORDER — FUROSEMIDE 10 MG/ML IJ SOLN
40.0000 mg | Freq: Two times a day (BID) | INTRAMUSCULAR | Status: DC
  Administered 2016-05-22 – 2016-05-24 (×4): 40 mg via INTRAVENOUS
  Filled 2016-05-22 (×4): qty 4

## 2016-05-22 NOTE — Progress Notes (Signed)
PULMONARY  / CRITICAL CARE MEDICINE  Name: Brett Curry MRN: [id number] DOB: [birth year]    LOS: 7  REFERRING MD :  Dr. Manus Gunningancour, EDP  CHIEF COMPLAINT:  Respiratory distress  BRIEF PATIENT DESCRIPTION:  56 year old man with PMH asthma, afib on anticoag, morbid obesity, OSA (stopped cpap x 6mos), COPD on continuous O2 at home (per wife 7L x 1 yr), HLD, uncontrolled type II DM, [facility] via EMS [facility] with the complaint of respiratory distress for a few days. With EMS he was found to be in Afib with RVR and had wheezing. His mental status worsened during transport, he was given 2 nebs and solumedrol by EMS. Upon arrival to the ED he was in extremis with respiratory distress, obtunded and unable to provide a history. His O2 sat was 83% and improved to 99% with oxygen. He was afebrile and BP was ok. He was intubated due to hypercarbic respiratory failure and altered mental status, ABG showed pH 7.118, CO2 above reportable range for lab, and PO2 178. Chest xray showed interstitial edema and large right pleural effusion. He received 40 mg IV lasix, magnesium, and cardizem gtt for afib with rvr.    LINES / TUBES: OG tube 4/29>> Urethral catheter 4/29 >> Peripheral IV left antecubital, right antecubital, right posterior antecubital 4/29>>  ETT 4/29 >>  R PICC 5/1 >   CULTURES: MRSA - negative  Respiratory culture >> PSA Blood cultures >> (-)  ANTIBIOTICS: Cefepime 4/29 > 52 Vancomycin 4/29 > 5/1 Fortaz 5/2   SIGNIFICANT EVENTS:  4/29 > admitted for respiratory failure   SUBJECTIVE : Slowly improving with diuresis + abx   VITAL SIGNS: Temp:  [97.3 F (36.3 C)-98.1 F (36.7 C)] 97.8 F (36.6 C) (05/06 0808) Pulse Rate:  [68-104] 68 (05/06 1541) Resp:  [20-30] 20 (05/06 1541) BP: (88-130)/(62-91) 103/71 (05/06 1541) SpO2:  [88 %-98 %] 98 % (05/06 1541) FiO2 (%):  [50 %] 50 % (05/06 1541) Weight:  [184.2 kg (406 lb)] 184.2 kg (406 lb) (05/06 0430) HEMODYNAMICS:    VENTILATOR SETTINGS: Vent Mode: PRVC FiO2 (%):  [50 %] 50 % Set Rate:  [20 bmp] 20 bmp Vt Set:  [500 mL] 500 mL PEEP:  [8 cmH20] 8 cmH20 Plateau Pressure:  [21 cmH20-25 cmH20] 25 cmH20 INTAKE / OUTPUT: Intake/Output      05/05 0701 - 05/06 0700 05/06 0701 - 05/07 0700   P.O.     I.V. (mL/kg) 1766.5 (9.6) 328.4 (1.8)   Other 70    NG/GT 1265    IV Piggyback 100    Total Intake(mL/kg) 3201.5 (17.4) 328.4 (1.8)   Urine (mL/kg/hr) 2615 (0.6) 300 (0.2)   Emesis/NG output 645 (0.1)    Stool     Total Output 3260 300   Net -58.5 +28.4          PHYSICAL EXAMINATION: General: Morbidly obese male, sedated, NAD. Easily arousable and follows simple commands Neuro:  Sedated CN grossly intact HEENT: PERRLA  Cardiovascular:  Tachycardic, irregularly irregular rhythm, less edema.   Lungs:  Intubated . Dec BS on BLF. Crackles at bases.  Abdomen: BS+, soft, distended,  non tender Musculoskeletal:  Normal tone Skin:  Intact, warm, and dry . Still with edema but significantly less.    LABS: Cbc  Recent Labs Lab 05/20/16 0430 05/21/16 0500 05/22/16 0935  WBC 10.7* 13.0* 13.0*  HGB 12.4* 12.6* 12.9*  HCT 43.2 44.2 43.9  PLT 139* 120* 137*    Chemistry  Recent Labs Lab 05/20/16 0430 05/21/16 0420 05/22/16 0935  NA 146* 150* 139  K 3.9 3.3* 3.9  CL 87* 91* 88*  CO2 49* 50* 38*  BUN 72* 59* 48*  CREATININE 0.91 0.70 0.58*  CALCIUM 9.2 9.7 8.7*  MG 2.2 2.2 1.8  PHOS 3.3 3.4 3.7  GLUCOSE 127* 218* 190*    Liver fxn No results for input(s): AST, ALT, ALKPHOS, BILITOT, PROT, ALBUMIN in the last 168 hours. coags No results for input(s): APTT, INR in the last 168 hours. Sepsis markers  Recent Labs Lab 05/16/16 0252 05/17/16 0225 05/18/16 0318  PROCALCITON <0.10 <0.10 <0.10   Cardiac markers  Recent Labs Lab 2016-05-27 2244  TROPONINI <0.03   BNP No results for input(s): PROBNP in the last 168 hours. ABG  Recent Labs Lab 05/16/16 0348 05/16/16 1524   PHART 7.545* 7.431  PCO2ART 47.7 64.6*  PO2ART 63.4* 71.1*  HCO3 41.1* 42.2*    CBG trend  Recent Labs Lab 05/21/16 2358 05/22/16 0422 05/22/16 0810 05/22/16 1230 05/22/16 1602  GLUCAP 199* 137* 181* 221* 248*     ASSESSMENT / PLAN:  PULMONARY  ASSESSMENT: Acute on chronic hypercarbic and hypoxemic respiratory failure 2/2 acute exacerbation of severe COPD in the setting of URI (Rhinovirus) + PSA  In trache aspirate/RML HAP  + Pulmonary edema + OSA (stopped cpap 6 mos ago) + OHS Hx of COPD ( FEV1/FVC 50%, FEV 28%. 05/2015)  Right pleural effusion  Baseline O2 at 7L per wife (at least the last year) PLAN:   On full vent support. He is now on 50% Fio2 and PEEP 8 and sats in 88-90%. Not ready for extubation.  He stopped using cpap x 6 mos ago for unknown reasons and he has all reasons to be on it.  He has been on O2 7L the last year.  Slowly getting more SOB + swelling.  We need to diuresce him dry and cont abx for now.  Finished 7D of Fortaz on 5/5. Observe off abx.  Will increase lasix to BID today from daily.  He was on q8 lasix but he became tachycardic and Na was elevated. HR and Na better. cont pulmicort BID and duoneb qid Will slowly taper off pred as he has been intermittently taking it since 02/2016. Decrease to pred 40 on 5/5  And taper off over 1 week (ordered He needs to be optimized prior to extubation as he may be non compliant with cpap.  I mentioned about a trache to wife. She wants to avoid if possible.    CARDIOVASCULAR  ASSESSMENT:  Atrial fibrillation.  Sinus tachycardia better with less diuresis Pulm edema  PLAN:  Cardizem PO QID Cont lasix >> will increase dose   RENAL  ASSESSMENT:  Azotemia Hypernatremia, better PLAN:   Monitor  Lasix BID from daily. Is (-) 8L but still has room to go Free water for hyper Na   GASTROINTESTINAL  ASSESSMENT:   Constipation > had BM on 5/5 PLAN:   protonix qhs  Cont TF miralax BID, lactulose  prn   HEMATOLOGIC  ASSESSMENT:  Leukocytosis likely 2/2 steroids + HAP PSA PLAN:  subq lovenox    INFECTIOUS  ASSESSMENT:   Rhinovirus URI + PSA PNA. Recent admit in 02/2016 PLAN:   Finished 7days of Fortaz on 5/5. Observe off abx.    ENDOCRINE  ASSESSMENT:   Hx of type II DM on levemir 40 units qHS, metformin 1000 mg qHS, and pioglitazone at home  PLAN:   On and off insulin drip.  Currently off but sugars have been high.  Cont lantus at  35 u  Cont sliding scale to resistant.    NEUROLOGIC  ASSESSMENT:   AMS 2/2 hypercarbia, agitated  PLAN:   On fentanyl gtt  On propofol gtt  Versed PRN  RASS goal -1  He easily gets agitated and propofol seems to control him the best.    I spent  35  minutes of Critical Care time with this patient today.   Family :   Wife extensively updated on 5/5.    Pollie Meyer, MD 05/22/2016, 4:05 PM [facility] Pager [phone] After 3 pm or if no answer, call [phone]

## 2016-05-23 DIAGNOSIS — E662 Morbid (severe) obesity with alveolar hypoventilation: Secondary | ICD-10-CM

## 2016-05-23 DIAGNOSIS — G9341 Metabolic encephalopathy: Secondary | ICD-10-CM

## 2016-05-23 LAB — GLUCOSE, CAPILLARY
GLUCOSE-CAPILLARY: 137 mg/dL — AB (ref 65–99)
GLUCOSE-CAPILLARY: 159 mg/dL — AB (ref 65–99)
GLUCOSE-CAPILLARY: 177 mg/dL — AB (ref 65–99)
GLUCOSE-CAPILLARY: 180 mg/dL — AB (ref 65–99)
GLUCOSE-CAPILLARY: 205 mg/dL — AB (ref 65–99)
GLUCOSE-CAPILLARY: 280 mg/dL — AB (ref 65–99)
Glucose-Capillary: 221 mg/dL — ABNORMAL HIGH (ref 65–99)

## 2016-05-23 LAB — BASIC METABOLIC PANEL
ANION GAP: 8 (ref 5–15)
BUN: 45 mg/dL — ABNORMAL HIGH (ref 6–20)
CHLORIDE: 88 mmol/L — AB (ref 101–111)
CO2: 48 mmol/L — AB (ref 22–32)
Calcium: 9.4 mg/dL (ref 8.9–10.3)
Creatinine, Ser: 0.62 mg/dL (ref 0.61–1.24)
GFR calc Af Amer: >60 mL/min (ref >60)
GFR calc non Af Amer: >60 mL/min (ref >60)
GLUCOSE: 152 mg/dL — AB (ref 65–99)
POTASSIUM: 3.5 mmol/L (ref 3.5–5.1)
Sodium: 144 mmol/L (ref 135–145)

## 2016-05-23 LAB — CBC
HEMATOCRIT: 44.6 % (ref 39.0–52.0)
HEMOGLOBIN: 12.9 g/dL — AB (ref 13.0–17.0)
MCH: 28.5 pg (ref 26.0–34.0)
MCHC: 28.9 g/dL — ABNORMAL LOW (ref 30.0–36.0)
MCV: 98.7 fL (ref 78.0–100.0)
Platelets: 134 10*3/uL — ABNORMAL LOW (ref 150–400)
RBC: 4.52 MIL/uL (ref 4.22–5.81)
RDW: 15.5 % (ref 11.5–15.5)
WBC: 12.7 10*3/uL — AB (ref 4.0–10.5)

## 2016-05-23 LAB — PHOSPHORUS: Phosphorus: 3.7 mg/dL (ref 2.5–4.6)

## 2016-05-23 LAB — MAGNESIUM: Magnesium: 1.9 mg/dL (ref 1.7–2.4)

## 2016-05-23 MED ORDER — IPRATROPIUM-ALBUTEROL 0.5-2.5 (3) MG/3ML IN SOLN
3.0000 mL | Freq: Four times a day (QID) | RESPIRATORY_TRACT | Status: DC
  Administered 2016-05-23 – 2016-05-27 (×16): 3 mL via RESPIRATORY_TRACT
  Filled 2016-05-23 (×17): qty 3

## 2016-05-23 MED ORDER — POTASSIUM CHLORIDE 20 MEQ/15ML (10%) PO SOLN
40.0000 meq | Freq: Once | ORAL | Status: AC
  Administered 2016-05-23: 40 meq
  Filled 2016-05-23: qty 30

## 2016-05-23 MED ORDER — SENNOSIDES 8.8 MG/5ML PO SYRP
5.0000 mL | ORAL_SOLUTION | Freq: Two times a day (BID) | ORAL | Status: DC
  Administered 2016-05-23 – 2016-05-27 (×8): 5 mL
  Filled 2016-05-23 (×10): qty 5

## 2016-05-23 NOTE — Progress Notes (Signed)
Initial Nutrition Assessment  DOCUMENTATION CODES:   Morbid obesity  INTERVENTION:   Continue:   Vital High Protein at 10 ml/h (240 ml per day)  Pro-stat 60 ml 5 times per day  Provides 1240 kcal, 171 gm protein, 201 ml free water daily  Total intake with Propofol and TF is 2390 kcal, exceeding kcal need to maximize protein provision.  NUTRITION DIAGNOSIS:   Inadequate oral intake related to inability to eat as evidenced by NPO status.  GOAL:   Provide needs based on ASPEN/SCCM guidelines  MONITOR:   Vent status, TF tolerance, Labs, I & O's  ASSESSMENT:   56 yo male with multiple co morbidities including COPD, AFib RVR, morbid obesity, on home O2, presented with resp distress and AMS with severe hypercarbia. Intubated on admission.  Discussed patient with RN today. He is diuresing well, not tolerating wean.   Patient is currently receiving Vital High Protein via OGT at 10 ml/h (240 ml/day) with Prostat 60 ml 5 times per day to provide 1240 kcal, 171 gm protein, 201 ml free water daily. Free water flushes 200 ml every 6 hours. Total intake with current Propofol dosing is 2390 kcal (above kcal needs to maximize protein intake).  Patient is currently intubated on ventilator support Temp (24hrs), Avg:98.5 F (36.9 C), Min:97.8 F (36.6 C), Max:99.2 F (37.3 C)  Propofol: total intake 1045 ml (5/6) providing 1150 kcal from lipid  Labs and medications reviewed.  Diet Order:  Diet NPO time specified  Skin:  Reviewed, no issues (MASD to groin)  Last BM:  unknown  Height:   Ht Readings from Last 1 Encounters:  11/24/16 5\' 9"  (1.753 m)    Weight:   Wt Readings from Last 1 Encounters:  05/23/16 (!) 401 lb (181.9 kg)    Ideal Body Weight:  72.7 kg  BMI:  Body mass index is 59.22 kg/m.  Estimated Nutritional Needs:   Kcal:  1600-1820  Protein:  182 gm  Fluid:  2-2.5 L  EDUCATION NEEDS:   No education needs identified at this time  Joaquin CourtsKimberly  Walta [place], RD, LDN, CNSC Pager [phone] After Hours Pager [phone]

## 2016-05-23 NOTE — Progress Notes (Signed)
Inpatient Diabetes Program Recommendations  AACE/ADA: New Consensus Statement on Inpatient Glycemic Control (2015)  Target Ranges:  Prepandial:   less than 140 mg/dL      Peak postprandial:   less than 180 mg/dL (1-2 hours)      Critically ill patients:  140 - 180 mg/dL   Results for Brett Curry, Brett Curry (MRN [id number]) as of 05/23/2016 09:09  Ref. Range 05/22/2016 08:10 05/22/2016 12:30 05/22/2016 16:02 05/22/2016 20:34 05/23/2016 00:15 05/23/2016 04:22 05/23/2016 08:16  Glucose-Capillary Latest Ref Range: 65 - 99 mg/dL 409181 (H) 811221 (H) 914248 (H) 198 (H) 180 (H) 137 (H) 159 (H)   Review of Glycemic Control  Current orders for Inpatient glycemic control: Lantus 35 units daily, Novolog 0-20 units Q4H  Inpatient Diabetes Program Recommendations:  Insulin - Basal: Over the past 24 hours patient has received a total of Novolog 33 units for correction. Noted steroids was decreased from Prednisone 20 mg to 10 mg QAM. Please consider increasing Lantus to 40 units daily.  Thanks, Orlando PennerMarie Camesha Farooq, RN, MSN, CDE Diabetes Coordinator Inpatient Diabetes Program [phone] (Team Pager from 8am to 5pm)

## 2016-05-23 NOTE — Progress Notes (Signed)
PULMONARY  / CRITICAL CARE MEDICINE  Name: Brett Curry MRN: [id number] DOB: 1960/08/11    LOS: 8  REFERRING MD :  Dr. Manus Gunning, EDP  CHIEF COMPLAINT:  Respiratory distress  BRIEF PATIENT DESCRIPTION:  56 year old man with PMH asthma, afib on anticoag, morbid obesity, OSA (stopped cpap x 6mos), COPD on continuous O2 at home (per wife 7L x 1 yr), HLD, uncontrolled type II DM, presented via EMS to [facility] ED with the complaint of respiratory distress for a few days. With EMS he was found to be in Afib with RVR and had wheezing. His mental status worsened during transport, he was given 2 nebs and solumedrol by EMS. Upon arrival to the ED he was in extremis with respiratory distress, obtunded and unable to provide a history. His O2 sat was 83% and improved to 99% with oxygen. He was afebrile and BP was ok. He was intubated due to hypercarbic respiratory failure and altered mental status, ABG showed pH 7.118, CO2 above reportable range for lab, and PO2 178. Chest xray showed interstitial edema and large right pleural effusion. He received 40 mg IV lasix, magnesium, and cardizem gtt for afib with rvr.     SUBJECTIVE:  RN reports "breath stacking" on the vent when awake/sedation lightened.     VITAL SIGNS: Temp:  [97.8 F (36.6 C)-99.2 F (37.3 C)] 97.8 F (36.6 C) (05/07 0809) Pulse Rate:  [68-110] 110 (05/07 0821) Resp:  [20-30] 23 (05/07 0821) BP: (92-126)/(61-87) 116/73 (05/07 0821) SpO2:  [87 %-98 %] 96 % (05/07 0821) FiO2 (%):  [50 %-60 %] 50 % (05/07 0821) Weight:  [401 lb (181.9 kg)] 401 lb (181.9 kg) (05/07 0400)   HEMODYNAMICS:     VENTILATOR SETTINGS: Vent Mode: PRVC FiO2 (%):  [50 %-60 %] 50 % Set Rate:  [20 bmp] 20 bmp Vt Set:  [500 mL] 500 mL PEEP:  [8 cmH20] 8 cmH20 Plateau Pressure:  [21 cmH20-25 cmH20] 22 cmH20   INTAKE / OUTPUT: Intake/Output      05/06 0701 - 05/07 0700 05/07 0701 - 05/08 0700   I.V. (mL/kg) 1754.4 (9.6) 65.8 (0.4)   Other     NG/GT 370 210    IV Piggyback     Total Intake(mL/kg) 2124.4 (11.7) 275.8 (1.5)   Urine (mL/kg/hr) 3975 (0.9) 160 (0.3)   Emesis/NG output     Total Output 3975 160   Net -1850.6 +115.8          PHYSICAL EXAMINATION: General: obese male on vent, NAD HEENT: MM pink/moist, ETT Neuro: sedate on vent CV: s1s2 rrr, no m/r/g PULM: even/non-labored, lungs bilaterally coarse OE:VOJJ, non-tender, bsx4 active  Extremities: warm/dry, 1+ BLE edema (appears improved with wrinkling of skin post diuresis) Skin: no rashes or lesions.  BLE with changes consistent with chronic venous insufficiency     LABS: CBC  Recent Labs Lab 05/21/16 0500 05/22/16 0935 05/23/16 0418  WBC 13.0* 13.0* 12.7*  HGB 12.6* 12.9* 12.9*  HCT 44.2 43.9 44.6  PLT 120* 137* 134*    Chemistry  Recent Labs Lab 05/21/16 0420 05/22/16 0935 05/22/16 2000 05/23/16 0418  NA 150* 139 143 144  K 3.3* 3.9 3.9 3.5  CL 91* 88* 89* 88*  CO2 50* 38* 48* 48*  BUN 59* 48* 46* 45*  CREATININE 0.70 0.58* 0.69 0.62  CALCIUM 9.7 8.7* 9.3 9.4  MG 2.2 1.8  --  1.9  PHOS 3.4 3.7  --  3.7  GLUCOSE 218* 190* 211*  152*    LFT No results for input(s): AST, ALT, ALKPHOS, BILITOT, PROT, ALBUMIN in the last 168 hours.   Coags No results for input(s): APTT, INR in the last 168 hours.   Sepsis markers  Recent Labs Lab 05/17/16 0225 05/18/16 0318  PROCALCITON <0.10 <0.10   Cardiac markers No results for input(s): CKTOTAL, CKMB, TROPONINI in the last 168 hours.   BNP No results for input(s): PROBNP in the last 168 hours.   ABG  Recent Labs Lab 05/16/16 1524  PHART 7.431  PCO2ART 64.6*  PO2ART 71.1*  HCO3 42.2*    CBG trend  Recent Labs Lab 05/22/16 1602 05/22/16 2034 05/23/16 0015 05/23/16 0422 05/23/16 0816  GLUCAP 248* 198* 180* 137* 159*   LINES / TUBES: OG tube 4/29 >> Urethral catheter 4/29 >>  ETT 4/29 >>  R PICC 5/1 >>   CULTURES: MRSA - negative  Respiratory culture 4/29 >> pseudomonas >> pan  sensitive  Blood cultures 4/29 >> negative  ANTIBIOTICS: Cefepime 4/29 > 5/2 Vancomycin 4/29 > 5/1 Elita QuickFortaz 5/2 >> 5/5  SIGNIFICANT EVENTS:  4/29  Admitted for respiratory failure 5/07  Not tolerating wean, diuresing well    ASSESSMENT / PLAN:  PULMONARY A: Acute on Chronic Hypercarbic & Hypoxemic Respiratory Failure - in setting of URI (rhinovirus +), RML HAP, COPD and untreated OHS  Hx of COPD ( FEV1/FVC 50%, FEV 28%. 05/2015)  Right pleural effusion  Baseline O2 at 7L per wife (at least the last year) OHS/OSA - noncompliant with CPAP x6 months P:   PRVC 8cc/kg  Daily SBT / WUA  Wean FiO2 for sats >88% Pulmicort BID  Continue Lasix 40 mg BID  Duoneb Q6 & PRN Continue prednisone taper as outlined Monitor off abx, completed 7d Fortaz (5/5) Wife would like to avoid trach if possible   CARDIOVASCULAR A:  Atrial Fibrillation  Sinus Tachycardia  Pulmonary Edema  P:  Continue Cardizem 60 mg Q6 PT ICU monitoring of hemodynamics Lasix 40 mg BID   RENAL A:  Azotemia Hypernatremia - improved with free water  P:   Lasix as above Trend BMP / urinary output Replace electrolytes as indicated Avoid nephrotoxic agents as able, ensure adequate renal perfusion Continue free water, 200 ml   GASTROINTESTINAL A:   Constipation  P:   Protonix PT for stress ulcer prophylaxis  TF per Nutrition  Miralax BID PRN lactulose, dulcolax suppository    HEMATOLOGIC A:  Leukocytosis - likely secondary to steroids, HAP  P:  Continue SQ Lovenox for DVT prophylaxis  Monitor CBC    INFECTIOUS A:   Rhinovirus URI + PSA PNA. Recent admit in 02/2016 P:   Monitor off ABX, completed 7 d fortaz (5/5) Monitor fever curve / WBC trend   ENDOCRINE A:   DM II - on levemir 40 units QHS, metformin 1000 mg QHS + pioglitazone   P:   Continue lantus 35 units  Resistant SSI    NEUROLOGIC A:   Acute Metabolic Encephalopathy - in setting of hypercarbia P:   Continue Propofol gtt  for sedation  Fentanyl gtt for pain RASS Goal: 0 to -1    Family: No family available on am NP rounding.  Will update on arrival.   CC Time: 35 minutes.   Canary BrimBrandi Raequan Vanschaick, NP-C [facility] Pgr: [phone] or if no answer [phone] 05/23/2016, 9:40 AM

## 2016-05-23 NOTE — Progress Notes (Signed)
Pt placed back on full vent support due to agitation and desat to 85%.  RN notified.

## 2016-05-24 ENCOUNTER — Inpatient Hospital Stay (HOSPITAL_COMMUNITY): Payer: Medicare Other

## 2016-05-24 DIAGNOSIS — G9340 Encephalopathy, unspecified: Secondary | ICD-10-CM

## 2016-05-24 DIAGNOSIS — I480 Paroxysmal atrial fibrillation: Secondary | ICD-10-CM

## 2016-05-24 LAB — BASIC METABOLIC PANEL
Anion gap: 8 (ref 5–15)
Anion gap: 7 (ref 5–15)
BUN: 52 mg/dL — AB (ref 6–20)
BUN: 49 mg/dL — ABNORMAL HIGH (ref 6–20)
CALCIUM: 8.9 mg/dL (ref 8.9–10.3)
CALCIUM: 8.9 mg/dL (ref 8.9–10.3)
CO2: 43 mmol/L — ABNORMAL HIGH (ref 22–32)
CO2: 43 mmol/L — ABNORMAL HIGH (ref 22–32)
CREATININE: 0.74 mg/dL (ref 0.61–1.24)
CREATININE: 0.71 mg/dL (ref 0.61–1.24)
Chloride: 90 mmol/L — ABNORMAL LOW (ref 101–111)
Chloride: 91 mmol/L — ABNORMAL LOW (ref 101–111)
GFR calc Af Amer: >60 mL/min (ref >60)
GFR calc Af Amer: >60 mL/min (ref >60)
GLUCOSE: 239 mg/dL — AB (ref 65–99)
Glucose, Bld: 227 mg/dL — ABNORMAL HIGH (ref 65–99)
Potassium: 3.8 mmol/L (ref 3.5–5.1)
Potassium: 3.7 mmol/L (ref 3.5–5.1)
SODIUM: 141 mmol/L (ref 135–145)
Sodium: 141 mmol/L (ref 135–145)

## 2016-05-24 LAB — CBC WITH DIFFERENTIAL/PLATELET
Basophils Absolute: 0 10*3/uL (ref 0.0–0.1)
Basophils Relative: 0 %
EOS ABS: 0.3 10*3/uL (ref 0.0–0.7)
Eosinophils Relative: 3 %
HEMATOCRIT: 43.4 % (ref 39.0–52.0)
HEMOGLOBIN: 12.9 g/dL — AB (ref 13.0–17.0)
LYMPHS ABS: 1.8 10*3/uL (ref 0.7–4.0)
Lymphocytes Relative: 15 %
MCH: 28.9 pg (ref 26.0–34.0)
MCHC: 29.7 g/dL — AB (ref 30.0–36.0)
MCV: 97.3 fL (ref 78.0–100.0)
MONO ABS: 1.3 10*3/uL — AB (ref 0.1–1.0)
MONOS PCT: 11 %
NEUTROS PCT: 71 %
Neutro Abs: 8.1 10*3/uL — ABNORMAL HIGH (ref 1.7–7.7)
Platelets: 149 10*3/uL — ABNORMAL LOW (ref 150–400)
RBC: 4.46 MIL/uL (ref 4.22–5.81)
RDW: 15.5 % (ref 11.5–15.5)
WBC: 11.5 10*3/uL — ABNORMAL HIGH (ref 4.0–10.5)

## 2016-05-24 LAB — RENAL FUNCTION PANEL
ANION GAP: 10 (ref 5–15)
Albumin: 2.7 g/dL — ABNORMAL LOW (ref 3.5–5.0)
BUN: 52 mg/dL — AB (ref 6–20)
CO2: 43 mmol/L — AB (ref 22–32)
Calcium: 9.2 mg/dL (ref 8.9–10.3)
Chloride: 89 mmol/L — ABNORMAL LOW (ref 101–111)
Creatinine, Ser: 0.62 mg/dL (ref 0.61–1.24)
GFR calc Af Amer: >60 mL/min (ref >60)
GFR calc non Af Amer: >60 mL/min (ref >60)
GLUCOSE: 135 mg/dL — AB (ref 65–99)
POTASSIUM: 3.6 mmol/L (ref 3.5–5.1)
Phosphorus: 4.3 mg/dL (ref 2.5–4.6)
SODIUM: 142 mmol/L (ref 135–145)

## 2016-05-24 LAB — GLUCOSE, CAPILLARY
Glucose-Capillary: 131 mg/dL — ABNORMAL HIGH (ref 65–99)
Glucose-Capillary: 136 mg/dL — ABNORMAL HIGH (ref 65–99)
Glucose-Capillary: 236 mg/dL — ABNORMAL HIGH (ref 65–99)
Glucose-Capillary: 271 mg/dL — ABNORMAL HIGH (ref 65–99)
Glucose-Capillary: 167 mg/dL — ABNORMAL HIGH (ref 65–99)

## 2016-05-24 LAB — MAGNESIUM
MAGNESIUM: 1.7 mg/dL (ref 1.7–2.4)
MAGNESIUM: 2 mg/dL (ref 1.7–2.4)
Magnesium: 1.8 mg/dL (ref 1.7–2.4)

## 2016-05-24 LAB — TRIGLYCERIDES: Triglycerides: 419 mg/dL — ABNORMAL HIGH (ref <150)

## 2016-05-24 MED ORDER — DOCUSATE SODIUM 50 MG/5ML PO LIQD
100.0000 mg | Freq: Two times a day (BID) | ORAL | Status: DC
  Administered 2016-05-24 – 2016-05-27 (×6): 100 mg
  Filled 2016-05-24 (×8): qty 10

## 2016-05-24 MED ORDER — POTASSIUM CHLORIDE 20 MEQ/15ML (10%) PO SOLN
40.0000 meq | Freq: Once | ORAL | Status: AC
  Administered 2016-05-24: 40 meq
  Filled 2016-05-24: qty 30

## 2016-05-24 MED ORDER — GUAIFENESIN 100 MG/5ML PO SOLN
15.0000 mL | Freq: Two times a day (BID) | ORAL | Status: AC
  Administered 2016-05-24 – 2016-05-26 (×3): 300 mg
  Filled 2016-05-24 (×5): qty 15

## 2016-05-24 MED ORDER — MAGNESIUM SULFATE 2 GM/50ML IV SOLN
2.0000 g | Freq: Once | INTRAVENOUS | Status: AC
  Administered 2016-05-24: 2 g via INTRAVENOUS
  Filled 2016-05-24: qty 50

## 2016-05-24 MED ORDER — DEXMEDETOMIDINE HCL IN NACL 400 MCG/100ML IV SOLN
0.4000 ug/kg/h | INTRAVENOUS | Status: DC
  Administered 2016-05-24: 1 ug/kg/h via INTRAVENOUS
  Administered 2016-05-25: 0.4 ug/kg/h via INTRAVENOUS
  Administered 2016-05-25 (×2): 0.8 ug/kg/h via INTRAVENOUS
  Administered 2016-05-25 (×4): 0.6 ug/kg/h via INTRAVENOUS
  Administered 2016-05-26 – 2016-05-27 (×14): 1 ug/kg/h via INTRAVENOUS
  Filled 2016-05-24 (×25): qty 100

## 2016-05-24 MED ORDER — FUROSEMIDE 10 MG/ML IJ SOLN
80.0000 mg | Freq: Four times a day (QID) | INTRAMUSCULAR | Status: DC
  Administered 2016-05-25 (×2): 80 mg via INTRAVENOUS
  Filled 2016-05-24 (×2): qty 8

## 2016-05-24 MED ORDER — FUROSEMIDE 10 MG/ML IJ SOLN
40.0000 mg | Freq: Four times a day (QID) | INTRAMUSCULAR | Status: DC
  Administered 2016-05-24 (×2): 40 mg via INTRAVENOUS
  Filled 2016-05-24 (×4): qty 4

## 2016-05-24 MED ORDER — ENOXAPARIN SODIUM 100 MG/ML ~~LOC~~ SOLN
90.0000 mg | SUBCUTANEOUS | Status: DC
  Administered 2016-05-24 – 2016-05-26 (×3): 90 mg via SUBCUTANEOUS
  Filled 2016-05-24 (×3): qty 0.9

## 2016-05-24 MED ORDER — FREE WATER
100.0000 mL | Freq: Three times a day (TID) | Status: DC
Start: 2016-05-24 — End: 2016-05-27
  Administered 2016-05-24 – 2016-05-27 (×6): 100 mL

## 2016-05-24 MED ORDER — MIDAZOLAM HCL 2 MG/2ML IJ SOLN
1.0000 mg | INTRAMUSCULAR | Status: DC | PRN
  Administered 2016-05-24 (×3): 2 mg via INTRAVENOUS
  Filled 2016-05-24 (×3): qty 2

## 2016-05-24 MED ORDER — SODIUM CHLORIDE 0.9 % IV SOLN
0.4000 ug/kg/h | INTRAVENOUS | Status: DC
  Administered 2016-05-24: 0.4 ug/kg/h via INTRAVENOUS
  Filled 2016-05-24 (×2): qty 2

## 2016-05-24 NOTE — Progress Notes (Signed)
When changing PICC dressing, patient has had a reaction to the dressing with multiple red open spots where the adhesive is on the dressing. Will continue to monitor. Tamsen MeekLewis, Bryla Burek E, RN 05/24/2016 2:38 PM

## 2016-05-24 NOTE — Progress Notes (Signed)
RT note- Placed back to full support, decrease in sp02.

## 2016-05-24 NOTE — Progress Notes (Signed)
[facility] Attending Note  Presenting HPI:  56 y.o. man with PMH asthma, afib on anticoag, morbid obesity, OSA (stopped cpap x 79mo), COPD on continuous O2 at home (per wife 7L x 1 yr), HLD, uncontrolled type II DM, [facility]. With EMS he was found to be in Afib with RVR and had wheezing. His mental status worsened during transport, he was given 2 nebs and solumedrol by EMS. Upon arrival to the ED he was in extremis with respiratory distress, obtunded and unable to provide a history. His O2 sat was 83% and improved to 99% with oxygen. He was afebrile and BP was ok. He was intubated due to hypercarbic respiratory failure and altered mental status, ABG showed pH 7.118, CO2 above reportable range for lab, and PO2 178. Chest xray showed interstitial edema and large right pleural effusion. He received 40 mg IV lasix, magnesium, and cardizem gtt for afib with rvr.   Subjective:  No acute events overnight. Patient more tachycardic with lightening of sedation & SBT this morning. Still hasn't had a bowel movement.   Review of Systems:  Unable to obtain given intubation & sedation.   Vent Mode: PRVC FiO2 (%):  [40 %-50 %] 40 % Set Rate:  [16 bmp-20 bmp] 20 bmp Vt Set:  [500 mL-5000 mL] 500 mL PEEP:  [8 cmH20] 8 cmH20 Pressure Support:  [5 cmH20] 5 cmH20 Plateau Pressure:  [19 cmH20-25 cmH20] 25 cmH20  Temp:  [98.1 F (36.7 C)-98.8 F (37.1 C)] 98.4 F (36.9 C) (05/08 0746) Pulse Rate:  [91-145] 145 (05/08 0900) Resp:  [11-37] 31 (05/08 0900) BP: (88-114)/(48-75) 107/70 (05/08 0900) SpO2:  [76 %-96 %] 76 % (05/08 0900) FiO2 (%):  [40 %-50 %] 40 % (05/08 0822) Weight:  [183.7 kg (405 lb)] 183.7 kg (405 lb) (05/08 0500)  Gen.: Still no family at bedside. Comfortable. Morbidly obese. Integument: No rash on exposed skin. Chronic venous stasis changes in bilateral lower extremities. HEENT:  Endotracheal tube in place. Tachycardia mucous membranes. No scleral icterus. Neurological: Moving all 4 extremities equally. Does not follow commands. Seems to attend to voice. Cardiovascular: Tachycardic with irregular rhythm. Unable to appreciate JVD given body habitus. Pulmonary: Bilateral basilar crackles. Distant breath sounds bilaterally. Symmetric chest wall rise on ventilator. Increased work of breathing on spontaneous breathing trial with pressure support 8/8. Abdomen: Soft. Protuberant. Hypoactive bowel sounds.  LINES/TUBES: OETT 4/29 >>> RUE TLC PICC 5/1 >>> Foley 4/29 >>> OGT 4/29 >>> PIV  CBC Latest Ref Rng & Units 05/24/2016 05/23/2016 05/22/2016  WBC 4.0 - 10.5 K/uL 11.5(H) 12.7(H) 13.0(H)  Hemoglobin 13.0 - 17.0 g/dL 12.9(L) 12.9(L) 12.9(L)  Hematocrit 39.0 - 52.0 % 43.4 44.6 43.9  Platelets 150 - 400 K/uL 149(L) 134(L) 137(L)   BMP Latest Ref Rng & Units 05/24/2016 05/23/2016 05/22/2016  Glucose 65 - 99 mg/dL 135(H) 152(H) 211(H)  BUN 6 - 20 mg/dL 52(H) 45(H) 46(H)  Creatinine 0.61 - 1.24 mg/dL 0.62 0.62 0.69  Sodium 135 - 145 mmol/L 142 144 143  Potassium 3.5 - 5.1 mmol/L 3.6 3.5 3.9  Chloride 101 - 111 mmol/L 89(L) 88(L) 89(L)  CO2 22 - 32 mmol/L 43(H) 48(H) 48(H)  Calcium 8.9 - 10.3 mg/dL 9.2 9.4 9.3    Hepatic Function Latest Ref Rng & Units 05/24/2016 02/23/2016 02/22/2016  Total Protein 6.5 - 8.1 g/dL - 6.6 7.3  Albumin 3.5 - 5.0 g/dL 2.7(L) 3.2(L)  3.4(L)  AST 15 - 41 U/L - 13(L) 15  ALT 17 - 63 U/L - 17 18  Alk Phosphatase 38 - 126 U/L - 58 59  Total Bilirubin 0.3 - 1.2 mg/dL - 0.6 0.5    IMAGING/STUDIES: PFT 06/10/15: FVC 2.94 L (56%) FEV1 1.13 L (28%) FEV1/FVC 0.39 FEF 25-75 0.3 L (11%) negative bronchodilator response TLC 6.09 L (82%) RV 142% ERV 37% DLCO uncorrected 55% BILAT LE VENOUS DUPLEX 4/30:  No DVT.  PORT CXR 5/5:  Previously reviewed by me. Endotracheal tube in good position. Right upper extremity PICC line with tip terminating in the superior vena cava.  Bilateral lower lung opacities right greater than left suggestive of pleural effusions versus consolidation. Enteric feeding tube coursing below diaphragm. PORT CXR 5/8:  Personally reviewed by me. Persistent bilateral lower lung opacities. Enteric feeding tube coursing below diaphragm. Endotracheal tube in good position.  MICROBIOLOGY: MRSA PCR 4/29:  Negative  Blood Cultures x2 4/29:  Negative  Tracheal Aspirate Culture 4/29:  Pseudomonas aeruginosa (pan sensitive) Respiratory Panel PCR 4/29:  Rhinovirus HIV 4/29:  Nonreactive  Urine Streptococcal Antigen 4/29:  Negative  Urine Legionella Antigen 4/29:  Negative   ANTIBIOTICS: Cefepime 4/29 - 5/2 Vancomycin 5/29 - 5/1 Tressie Ellis 5/2 - 5/5  SIGNIFICANT EVENTS: 04/29 - Admit  ASSESSMENT/PLAN:  56 y.o. male with morbid obesity and OSA/OHS. History of nonadherence to CPAP therapy. Presenting with acute respiratory failure. Respiratory status slowly improved. Patient does appear to have some underlying delirium contributing to problems with ventilator weaning.  1. Acute on chronic hypoxic and hypercarbic respiratory failure: Continuing daily spontaneous breathing trial and pressure support wean. Go saturation 88-94%. Continuing Pulmicort nebulized twice daily and Duonebs every 6 hours. Continuing prednisone taper. Increasing diuresis with Lasix IV every 6 hours. 2. Probable delirium/acute encephalopathy: Transitioning from propofol drip to Precedex drip. Continuing fentanyl drip. Versed IV when necessary. Consider atypical medications if Precedex, tolerated or it is ineffective. 3. Pseudomonas pneumonia: Status post treatment. Plan to reculture for any fever or signs of worsening secretions. 4. Atrial fibrillation: Continuing Cardizem 60 mg by mouth every 6 hours. Monitoring patient on telemetry. 5. Constipation: Continuing senna and Colace twice a day. Checking portable abdominal x-ray. 6. Hyponatremia: Resolved. Decreasing free  water. 7. Diabetes mellitus type 2: Glucose is controlled. Continuing Lantus and sliding scale insulin with Accu-Cheks every 4 hours. Holding outpatient oral antihyperglycemic regimen. 8. Thrombocytopenia: Platelet count slowly improving. Likely multifactorial from pneumonia and sepsis.  Prophylaxis:  SCDs, Lovenox & Protonix via tube qhs.  Diet:  NPO. Continuing Tube Feedings.  Code Status:  Full Code per previous physician discussions.  Disposition:  Remains critically ill and endotracheally intubated.  Family Update:  No family at bedside during rounds.   I have personally spent a total of 33 minutes of critical care time today caring for the patient & reviewing the patient's electronic medical record.  Sonia Baller Ashok Cordia, M.D. [facility] Pager:  [phone] After 3pm or if no response, call [phone] 9:46 AM 05/24/16

## 2016-05-24 NOTE — Progress Notes (Signed)
Inpatient Diabetes Program Recommendations  AACE/ADA: New Consensus Statement on Inpatient Glycemic Control (2015)  Target Ranges:  Prepandial:   less than 140 mg/dL      Peak postprandial:   less than 180 mg/dL (1-2 hours)      Critically ill patients:  140 - 180 mg/dL   Results for Narda BondsDURHAM, Maki P (MRN [id number]) as of 05/24/2016 07:19  Ref. Range 05/23/2016 04:22 05/23/2016 08:16 05/23/2016 12:04 05/23/2016 15:43 05/23/2016 19:55 05/23/2016 23:42 05/24/2016 03:20  Glucose-Capillary Latest Ref Range: 65 - 99 mg/dL 562137 (H)  Novolog 3 units 159 (H)  Novolog 4 units 221 (H)  Novolog 7 units 280 (H)  Novolog 11 units 205 (H)  Novolog 7 units  Lantus 35 units 177 (H)  Novolog 4 units 131 (H)  Novolog 3 units   Review of Glycemic Control  Current orders for Inpatient glycemic control: Lantus 35 units daily, Novolog 0-20 units Q4H  Inpatient Diabetes Program Recommendations:  Insulin - Basal: Over the past 24 hours patient has received a total of Novolog 39 units for correction. Noted steroids were decreased and are being tapered.  Please consider increasing Lantus to 40 units daily.  Thanks, Orlando PennerMarie Delanee Xin, RN, MSN, CDE Diabetes Coordinator Inpatient Diabetes Program [phone] (Team Pager from 8am to 5pm)

## 2016-05-24 NOTE — Progress Notes (Signed)
eLink Physician-Brief Progress Note Patient Name: Brett BondsRandy P Curry DOB: 06-23-1960 MRN: [id number]   Date of Service  05/24/2016  HPI/Events of Note  Hypoxia  Despite lasix, pos balance last 24 hours   Peep 10 laisx increase  eICU Interventions       Intervention Category Major Interventions: Respiratory failure - evaluation and management  Nelda BucksFEINSTEIN,DANIEL J. 05/24/2016, 10:39 PM

## 2016-05-24 NOTE — Progress Notes (Signed)
RT note-Patient tolerated wean for 30 min, increased work of breathing, patient has increased secretions, [facility] Placed back to full suport, continue to monitor.

## 2016-05-24 NOTE — Progress Notes (Signed)
eLink Physician-Brief Progress Note Patient Name: Brett Curry DOB: [birth year] MRN: [id number]   Date of Service  05/24/2016  HPI/Events of Note  secretiosn reported thick  eICU Interventions  Add guaf     Intervention Category Intermediate Interventions: Respiratory distress - evaluation and management  Nelda BucksFEINSTEIN,DANIEL J. 05/24/2016, 4:34 PM

## 2016-05-24 NOTE — Progress Notes (Signed)
RT note-Patient attempted to wean, continues to have decrease in sp02, currently sp02 87%. Continue to monitor.

## 2016-05-24 NOTE — Progress Notes (Signed)
On arrival noted SATs in the 70's.  Pt was manually ventilated bagged lavage with a Peep Valve and  5cc of Normal Saline due to patient oxygen desaturations and rhonchus BBS. Pt was suctioned x2 got back copious amounts of thick yellow/tan secretions with some mucous plugs noted. Post assessment patient SATs are now acceptable. And Peak Inspiratory Pressures  decline with suctioning. Pt tolerated procedure well no complications noted. RN aware.

## 2016-05-25 ENCOUNTER — Telehealth: Payer: Self-pay | Admitting: Pulmonary Disease

## 2016-05-25 LAB — RENAL FUNCTION PANEL
Albumin: 2.6 g/dL — ABNORMAL LOW (ref 3.5–5.0)
Anion gap: 9 (ref 5–15)
BUN: 44 mg/dL — AB (ref 6–20)
CHLORIDE: 91 mmol/L — AB (ref 101–111)
CO2: 43 mmol/L — ABNORMAL HIGH (ref 22–32)
CREATININE: 0.64 mg/dL (ref 0.61–1.24)
Calcium: 8.7 mg/dL — ABNORMAL LOW (ref 8.9–10.3)
GFR calc Af Amer: >60 mL/min (ref >60)
Glucose, Bld: 145 mg/dL — ABNORMAL HIGH (ref 65–99)
Phosphorus: 3.7 mg/dL (ref 2.5–4.6)
Potassium: 3.1 mmol/L — ABNORMAL LOW (ref 3.5–5.1)
Sodium: 143 mmol/L (ref 135–145)

## 2016-05-25 LAB — URINALYSIS, ROUTINE W REFLEX MICROSCOPIC
BILIRUBIN URINE: NEGATIVE
BILIRUBIN URINE: NEGATIVE
Glucose, UA: NEGATIVE mg/dL
Glucose, UA: NEGATIVE mg/dL
Hgb urine dipstick: NEGATIVE
KETONES UR: NEGATIVE mg/dL
Ketones, ur: NEGATIVE mg/dL
Leukocytes, UA: NEGATIVE
Leukocytes, UA: NEGATIVE
NITRITE: NEGATIVE
NITRITE: NEGATIVE
PH: 5 (ref 5.0–8.0)
PH: 5 (ref 5.0–8.0)
PROTEIN: NEGATIVE mg/dL
Protein, ur: 100 mg/dL — AB
SPECIFIC GRAVITY, URINE: 1.025 (ref 1.005–1.030)
Specific Gravity, Urine: 1.012 (ref 1.005–1.030)
Squamous Epithelial / LPF: NONE SEEN

## 2016-05-25 LAB — GLUCOSE, CAPILLARY
GLUCOSE-CAPILLARY: 178 mg/dL — AB (ref 65–99)
GLUCOSE-CAPILLARY: 183 mg/dL — AB (ref 65–99)
GLUCOSE-CAPILLARY: 190 mg/dL — AB (ref 65–99)
GLUCOSE-CAPILLARY: 217 mg/dL — AB (ref 65–99)
Glucose-Capillary: 105 mg/dL — ABNORMAL HIGH (ref 65–99)
Glucose-Capillary: 143 mg/dL — ABNORMAL HIGH (ref 65–99)
Glucose-Capillary: 150 mg/dL — ABNORMAL HIGH (ref 65–99)

## 2016-05-25 LAB — CBC WITH DIFFERENTIAL/PLATELET
BASOS ABS: 0 10*3/uL (ref 0.0–0.1)
BASOS PCT: 0 %
EOS PCT: 1 %
Eosinophils Absolute: 0.2 10*3/uL (ref 0.0–0.7)
HCT: 42.4 % (ref 39.0–52.0)
Hemoglobin: 12.5 g/dL — ABNORMAL LOW (ref 13.0–17.0)
LYMPHS PCT: 7 %
Lymphs Abs: 1.1 10*3/uL (ref 0.7–4.0)
MCH: 28.6 pg (ref 26.0–34.0)
MCHC: 29.5 g/dL — ABNORMAL LOW (ref 30.0–36.0)
MCV: 97 fL (ref 78.0–100.0)
MONO ABS: 1.4 10*3/uL — AB (ref 0.1–1.0)
Monocytes Relative: 9 %
Neutro Abs: 12.7 10*3/uL — ABNORMAL HIGH (ref 1.7–7.7)
Neutrophils Relative %: 83 %
PLATELETS: 162 10*3/uL (ref 150–400)
RBC: 4.37 MIL/uL (ref 4.22–5.81)
RDW: 15.6 % — AB (ref 11.5–15.5)
WBC: 15.5 10*3/uL — ABNORMAL HIGH (ref 4.0–10.5)

## 2016-05-25 LAB — BASIC METABOLIC PANEL
Anion gap: 9 (ref 5–15)
BUN: 58 mg/dL — AB (ref 6–20)
CALCIUM: 8.1 mg/dL — AB (ref 8.9–10.3)
CHLORIDE: 98 mmol/L — AB (ref 101–111)
CO2: 36 mmol/L — AB (ref 22–32)
CREATININE: 1 mg/dL (ref 0.61–1.24)
GFR calc Af Amer: >60 mL/min (ref >60)
GFR calc non Af Amer: >60 mL/min (ref >60)
GLUCOSE: 158 mg/dL — AB (ref 65–99)
Potassium: 3.6 mmol/L (ref 3.5–5.1)
SODIUM: 143 mmol/L (ref 135–145)

## 2016-05-25 LAB — POCT I-STAT 3, ART BLOOD GAS (G3+)
Acid-Base Excess: 17 mmol/L — ABNORMAL HIGH (ref 0.0–2.0)
BICARBONATE: 45.8 mmol/L — AB (ref 20.0–28.0)
O2 SAT: 91 %
PCO2 ART: 78.7 mmHg — AB (ref 32.0–48.0)
Patient temperature: 39.9
TCO2: 48 mmol/L (ref 0–100)
pH, Arterial: 7.385 (ref 7.350–7.450)
pO2, Arterial: 78 mmHg — ABNORMAL LOW (ref 83.0–108.0)

## 2016-05-25 LAB — PROTIME-INR
INR: 1.54
Prothrombin Time: 18.7 seconds — ABNORMAL HIGH (ref 11.4–15.2)

## 2016-05-25 LAB — MAGNESIUM
MAGNESIUM: 1.6 mg/dL — AB (ref 1.7–2.4)
Magnesium: 1.9 mg/dL (ref 1.7–2.4)

## 2016-05-25 LAB — APTT: aPTT: 41 seconds — ABNORMAL HIGH (ref 24–36)

## 2016-05-25 MED ORDER — POTASSIUM CHLORIDE 20 MEQ/15ML (10%) PO SOLN
30.0000 meq | ORAL | Status: AC
  Administered 2016-05-25 (×2): 30 meq
  Filled 2016-05-25 (×2): qty 30

## 2016-05-25 MED ORDER — VANCOMYCIN HCL 10 G IV SOLR
2000.0000 mg | Freq: Once | INTRAVENOUS | Status: AC
  Administered 2016-05-25: 2000 mg via INTRAVENOUS
  Filled 2016-05-25 (×2): qty 2000

## 2016-05-25 MED ORDER — PIPERACILLIN-TAZOBACTAM 3.375 G IVPB
3.3750 g | Freq: Three times a day (TID) | INTRAVENOUS | Status: DC
  Administered 2016-05-25 (×2): 3.375 g via INTRAVENOUS
  Filled 2016-05-25 (×4): qty 50

## 2016-05-25 MED ORDER — PHENYLEPHRINE HCL 10 MG/ML IJ SOLN
0.0000 ug/min | INTRAMUSCULAR | Status: DC
  Administered 2016-05-25: 10 ug/min via INTRAVENOUS
  Filled 2016-05-25: qty 1

## 2016-05-25 MED ORDER — ACETAMINOPHEN 160 MG/5ML PO SOLN
650.0000 mg | Freq: Four times a day (QID) | ORAL | Status: DC | PRN
  Administered 2016-05-25 – 2016-05-26 (×4): 650 mg
  Filled 2016-05-25 (×4): qty 20.3

## 2016-05-25 MED ORDER — LINEZOLID 600 MG/300ML IV SOLN: 600.0000 mg | Freq: Two times a day (BID) | INTRAVENOUS | Status: DC

## 2016-05-25 MED ORDER — SODIUM CHLORIDE 0.9 % IV BOLUS (SEPSIS)
500.0000 mL | Freq: Once | INTRAVENOUS | Status: AC
  Administered 2016-05-25: 500 mL via INTRAVENOUS

## 2016-05-25 MED ORDER — BISACODYL 10 MG RE SUPP
10.0000 mg | Freq: Once | RECTAL | Status: AC
  Administered 2016-05-25: 10 mg via RECTAL
  Filled 2016-05-25: qty 1

## 2016-05-25 MED ORDER — MEROPENEM 1 G IV SOLR
2.0000 g | Freq: Three times a day (TID) | INTRAVENOUS | Status: DC
  Administered 2016-05-25 – 2016-05-28 (×10): 2 g via INTRAVENOUS
  Filled 2016-05-25 (×13): qty 2

## 2016-05-25 MED ORDER — MAGNESIUM SULFATE 2 GM/50ML IV SOLN
2.0000 g | Freq: Once | INTRAVENOUS | Status: AC
  Administered 2016-05-25: 2 g via INTRAVENOUS
  Filled 2016-05-25: qty 50

## 2016-05-25 MED ORDER — SODIUM CHLORIDE 0.9 % IV SOLN
0.0000 ug/min | INTRAVENOUS | Status: DC
  Administered 2016-05-25: 100 ug/min via INTRAVENOUS
  Administered 2016-05-25: 90 ug/min via INTRAVENOUS
  Filled 2016-05-25 (×3): qty 4

## 2016-05-25 MED ORDER — KETOROLAC TROMETHAMINE 15 MG/ML IJ SOLN
15.0000 mg | Freq: Once | INTRAMUSCULAR | Status: AC
  Administered 2016-05-25: 15 mg via INTRAVENOUS
  Filled 2016-05-25: qty 1

## 2016-05-25 MED ORDER — SODIUM CHLORIDE 0.9 % IV SOLN
1500.0000 mg | Freq: Two times a day (BID) | INTRAVENOUS | Status: DC
  Filled 2016-05-25: qty 1500

## 2016-05-25 MED ORDER — FUROSEMIDE 10 MG/ML IJ SOLN: 60.0000 mg | Freq: Two times a day (BID) | INTRAMUSCULAR | Status: DC

## 2016-05-25 MED ORDER — LINEZOLID 600 MG/300ML IV SOLN
600.0000 mg | Freq: Two times a day (BID) | INTRAVENOUS | Status: DC
  Administered 2016-05-25 – 2016-05-27 (×5): 600 mg via INTRAVENOUS
  Filled 2016-05-25 (×7): qty 300

## 2016-05-25 NOTE — Progress Notes (Signed)
Inpatient Diabetes Program Recommendations  AACE/ADA: New Consensus Statement on Inpatient Glycemic Control (2015)  Target Ranges:  Prepandial:   less than 140 mg/dL      Peak postprandial:   less than 180 mg/dL (1-2 hours)      Critically ill patients:  140 - 180 mg/dL   Results for Narda BondsDURHAM, Dornell P (MRN [id number]) as of 05/25/2016 08:14  Ref. Range 05/24/2016 07:42 05/24/2016 11:55 05/24/2016 15:35 05/24/2016 22:00 05/25/2016 00:06  Glucose-Capillary Latest Ref Range: 65 - 99 mg/dL 756136 (H) 433236 (H) 295271 (H) 167 (H) 150 (H)  Results for Narda BondsDURHAM, Jakwan P (MRN [id number]) as of 05/25/2016 08:14  Ref. Range 05/24/2016 04:10 05/24/2016 11:23 05/24/2016 17:00 05/25/2016 04:40  Glucose Latest Ref Range: 65 - 99 mg/dL 301135 (H) 601227 (H) 093239 (H) 145 (H)   Review of Glycemic Control  Current orders for Inpatient glycemic control: Lantus 35 units daily, Novolog 0-20 units Q4H  Inpatient Diabetes Program Recommendations:  Insulin - Basal: Over the past 24 hours patient has received a total of Novolog 31 units for correction. Noted steroids are being tapered.  Please consider increasing Lantus to 40 units daily.  Thanks, Orlando PennerMarie Kenni Newton, RN, MSN, CDE Diabetes Coordinator Inpatient Diabetes Program [phone] (Team Pager from 8am to 5pm)

## 2016-05-25 NOTE — Progress Notes (Signed)
Pharmacy Antibiotic Note  Brett Curry is a 56 y.o. male admitted on 2016/08/22 with pneumonia.  Pharmacy has been consulted for Vancomycin/Zosyn dosing. Pt having increased temp (101.1) and increased secretions. WBC 11.5. Renal function good. Pt recently completed anti-biotics for Pseudomonas PNA.   Zosyn 5/9>> Vanc 4/29 >> 5/1, 5/9>> Cefepime 4/29 >> 5/2 Elita QuickFortaz 5/2 >> 5/5  Plan: Vancomycin 2000 mg IV x 1, then 1500 mg IV q12h Zosyn 3.375G IV q8h to be infused over 4 hours Trend WBC, temp, renal function  F/U infectious work-up Drug levels as indicated   Height: 5\' 9"  (175.3 cm) Weight: (!) 384 lb (174.2 kg) IBW/kg (Calculated) : 70.7  Temp (24hrs), Avg:99.2 F (37.3 C), Min:98.1 F (36.7 C), Max:101.1 F (38.4 C)   Recent Labs Lab 05/20/16 0430  05/21/16 0500 05/22/16 0935 05/22/16 2000 05/23/16 0418 05/24/16 0410 05/24/16 1123 05/24/16 1700  WBC 10.7*  --  13.0* 13.0*  --  12.7* 11.5*  --   --   CREATININE 0.91  < >  --  0.58* 0.69 0.62 0.62 0.74 0.71  < > = values in this interval not displayed.  Estimated Creatinine Clearance: 163.5 mL/min (by C-G formula based on SCr of 0.71 mg/dL).    No Known Allergies   Brett Curry, Brett Curry 05/25/2016 4:31 AM

## 2016-05-25 NOTE — Telephone Encounter (Signed)
Synetta Failnita brought [employer] paperwork- for patient's daughter Benjie Karvonennnie Kilman. - I have forward paperwork to [employer] via interoffice mail - pr

## 2016-05-25 NOTE — Progress Notes (Signed)
S:   RN reports concern for fever to 103.6 (core temp), change in respiratory pattern and heart rate.    O: Blood pressure 103/66, pulse (!) 115, temperature (!) 103.6 F (39.8 C), resp. rate (!) 28, height 5\' 9"  (1.753 m), weight (!) 384 lb (174.2 kg), SpO2 90 %.   General:  Obese male on vent in NAD HEENT: MM pink/moist, ETT Neuro: no response to verbal stimuli, fentanyl gtt turned off by RN CV: s1s2 rrr, no m/r/g PULM: even/non-labored, lungs bilaterally coarse WU:JWJXGI:soft, non-tender, bsx4 active  Extremities: warm/dry, improved generalized edema  Skin: no rashes or lesions, BLE changes consistent with venous stasis     Recent Labs Lab 05/23/16 0418 05/24/16 0410 05/25/16 0440  HGB 12.9* 12.9* 12.5*  HCT 44.6 43.4 42.4  WBC 12.7* 11.5* 15.5*  PLT 134* 149* 162    Recent Labs Lab 05/21/16 0420 05/22/16 0935  05/23/16 0418 05/24/16 0410 05/24/16 1123 05/24/16 1700 05/25/16 0440  NA 150* 139  < > 144 142 141 141 143  K 3.3* 3.9  < > 3.5 3.6 3.8 3.7 3.1*  CL 91* 88*  < > 88* 89* 90* 91* 91*  CO2 50* 38*  < > 48* 43* 43* 43* 43*  GLUCOSE 218* 190*  < > 152* 135* 227* 239* 145*  BUN 59* 48*  < > 45* 52* 52* 49* 44*  CREATININE 0.70 0.58*  < > 0.62 0.62 0.74 0.71 0.64  CALCIUM 9.7 8.7*  < > 9.4 9.2 8.9 8.9 8.7*  MG 2.2 1.8  --  1.9 1.8 1.7 2.0 1.6*  PHOS 3.4 3.7  --  3.7 4.3  --   --  3.7  < > = values in this interval not displayed.  A:  Acute on Chronic Hypoxic/Hypercarbic Respiratory Failure Fever HCAP vs Tracheobronchitis  OSA / OHS Sepsis  Atrial Fibrillation  Acute Encephalopathy  Hypokalemia / Hypomagnesemia  DM  Thrombocytopenia   P: Assess STAT ABG Give 500 ml NS bolus x1 Toradol IV x1 Hold fentanyl gtt for now  Work to control fever    Brett BrimBrandi Marline Morace, NP-C [place] [facility] Pgr: [phone] or if no answer [phone] 05/25/2016, 1:03 PM

## 2016-05-25 NOTE — Progress Notes (Signed)
[facility] LAB REPLACEMENT ONLY  The patient does apply [facility] the [facility] Electrolyte [facility] Protocol based on the criteria listed below:   1. Is GFR >/= 40 ml/min? Yes.    Patient's GFR today is >60 2. Is urine output >/= 0.5 ml/kg/hr for the last 6 hours? Yes.   Patient's UOP is 1.32 ml/kg/hr 3. Is BUN < 60 mg/dL? Yes.    Patient's BUN today is 44 4. Abnormal electrolyte(s): Potassium 3.1 5. Ordered repletion with: Potassium per protocol 6. If a panic level lab has been reported, has the CCM MD in charge been notified? Yes.  .   Physician:  Reche DixonSommer, Steve  Linell Shawn P 05/25/2016 5:50 AM

## 2016-05-25 NOTE — Progress Notes (Signed)
Pharmacy Antibiotic Note  Narda BondsRandy P Smouse is a 56 y.o. male admitted on 04/27/2016 with pneumonia. He continues to spike fevers on vancomycin and Zosyn. Pharmacy has been consulted for meropenem dosing.  Patient is morbidly obese. nCrCl >17900mL/min  Plan: Meropenem 2g IV q8h Zyvox 600mg  IV q12h per MD Follow c/s, clinical progression, renal function  Height: 5\' 9"  (175.3 cm) Weight: (!) 384 lb (174.2 kg) IBW/kg (Calculated) : 70.7  Temp (24hrs), Avg:101.8 F (38.8 C), Min:98.8 F (37.1 C), Max:104 F (40 C)   Recent Labs Lab 05/21/16 0500 05/22/16 0935  05/23/16 0418 05/24/16 0410 05/24/16 1123 05/24/16 1700 05/25/16 0440  WBC 13.0* 13.0*  --  12.7* 11.5*  --   --  15.5*  CREATININE  --  0.58*  < > 0.62 0.62 0.74 0.71 0.64  < > = values in this interval not displayed.  Estimated Creatinine Clearance: 163.5 mL/min (by C-G formula based on SCr of 0.64 mg/dL).    No Known Allergies  Antimicrobials this admission: Zyvox 5/9>> Meropenem 5/9>> Zosyn 5/9 >> 5/9 Vanc 4/29 >> 5/1; 5/9 >>5/9 Cefepime 4/29 >> 5/2 Elita QuickFortaz 5/2 >> 5/5   Microbiology results: 4/29 MRSA PCR: negative 4/29 Strep pneumo: negative 4/29 Resp panel: + Rhinovirus/Enterovirus 4/29 BCx x2: NGTD 4/29 TA: Pseudomonas (pan sensitive) 5/9 blood cx: 5/9 resp cx:   Leotis ShamesLauren D. Gaige Fussner, PharmD, BCPS Clinical Pharmacist Pager: [phone] 05/25/2016 6:59 PM

## 2016-05-25 NOTE — Progress Notes (Signed)
[facility] FOR AM LAB REPLACEMENT ONLY  The patient does [facility]ly[facility] Re[facility] Protocol based on the criteria listed below:   1. Is GFR >/= 40 ml/min? Yes.    Patient's GFR today is >60 2. Is urine output >/= 0.5 ml/kg/hr for the last 6 hours? Yes.   Patient's UOP is 1.32 ml/kg/hr 3. Is BUN < 60 mg/dL? Yes.    Patient's BUN today is 44 4. Abnormal electrolyte(s): Magnesium 1.6 5. Ordered repletion with: Magnesium per Protocol 6. If a panic level lab has been reported, has the CCM MD in charge been notified? Yes.  .   Physician:  Reche DixonSommer, Steve  Wilian Kwong P 05/25/2016 5:54 AM

## 2016-05-25 NOTE — Progress Notes (Signed)
eLink Physician-Brief Progress Note Patient Name[facility] DO[facility]: 10/14/1960 MRN: [id number]   Date of Service  05/25/2016  HPI/Events of Note  Fever to 101.1 F - Copious, yellow respiratory secretions. ALT and AST normal.   eICU Interventions  Will order: 1. Blood cultures X 2. 2. Tracheal Aspirate Culture. 3. UA. 4. Tylenol liquid 650 mg per tube Q 6 hours PRN Temp > 101.0 F. 5. Vancomycin and Zosyn per pharmacy consultation.     Intervention Category Major Interventions: Infection - evaluation and management  [name] 05/25/2016, 4:19 AM

## 2016-05-25 NOTE — Progress Notes (Signed)
Sputum collected per RRT and sent to Lab.

## 2016-05-25 NOTE — Progress Notes (Signed)
eLink Physician-Brief Progress Note Patient Name: Narda BondsRandy P Okerlund DOB: 07-Nov-1960 MRN: [id number]   Date of Service  05/25/2016  HPI/Events of Note  Fever 104 plus despite Vosyn New HCAP per PCCM MD note today and vent changes - VAP?  eICU Interventions  Repeat UA (rn says looks darker) BC repeat Change to mero (h/o pseudomonas)/ linazolid Dc vosyn Dc line neck , empiric, as been in since may 1 and fever above Place line cvl, get coags prior     Intervention Category Major Interventions: Infection - evaluation and management  Nelda BucksFEINSTEIN,DANIEL J. 05/25/2016, 6:45 PM

## 2016-05-25 NOTE — Progress Notes (Signed)
[place] Pulmonary & Critical Care Attending Note  Presenting HPI:  56 y.o. man with PMH asthma, afib on anticoag, morbid obesity, OSA (stopped cpap x 61mo), COPD on continuous O2 at home (per wife 7L x 1 yr), HLD, uncontrolled type II DM, [facility]. With EMS he was found to be in Afib with RVR and had wheezing. His mental status worsened during transport, he was given 2 nebs and solumedrol by EMS. Upon arrival to the ED he was in extremis with respiratory distress, obtunded and unable to provide a history. His O2 sat was 83% and improved to 99% with oxygen. He was afebrile and BP was ok. He was intubated due to hypercarbic respiratory failure and altered mental status, ABG showed pH 7.118, CO2 above reportable range for lab, and PO2 178. Chest xray showed interstitial edema and large right pleural effusion. He received 40 mg IV lasix, magnesium, and cardizem gtt for afib with rvr.   Subjective:  Patient febrile again this morning. Repeat cultures obtained earlier this morning. Started on empiric Vancomycin & Zosyn. Switched to Precedex yesterday.   Review of Systems:  Unable to obtain given intubation & sedation.   Vent Mode: PRVC FiO2 (%):  [40 %-60 %] 60 % Set Rate:  [20 bmp] 20 bmp Vt Set:  [500 mL] 500 mL PEEP:  [8 cmH20-10 cmH20] 10 cmH20 Pressure Support:  [8 cmH20] 8 cmH20 Plateau Pressure:  [23 cmH20-25 cmH20] 25 cmH20  Temp:  [98.1 F (36.7 C)-102.5 F (39.2 C)] 102.5 F (39.2 C) (05/09 0800) Pulse Rate:  [85-159] 119 (05/09 0900) Resp:  [20-36] 21 (05/09 0900) BP: (80-125)/(44-83) 114/69 (05/09 0900) SpO2:  [85 %-98 %] 88 % (05/09 0900) FiO2 (%):  [40 %-60 %] 60 % (05/09 0900) Weight:  [384 lb (174.2 kg)] 384 lb (174.2 kg) (05/09 0400)  Gen.: No family at bedside. Morbidly obese. No acute distress. Integument: Venous stasis changes bilateral lower extremities. No rash on exposed skin. HEENT: No  scleral icterus. Tacky mucous membranes. Endotracheal tube in place.  Cardiovascular: Tachycardic with irregular rhythm. No appreciable JVD given body habitus. Pulmonary: Basilar crackles persist. Symmetric chest wall expansion on ventilator. Distant breath sounds.  Abdomen: Soft. Protuberant. Hypoactive bowel sounds. Neurological: Moving all 4 extremities spontaneously. Still not following commands.   LINES/TUBES: OETT 4/29 >>> RUE TLC PICC 5/1 >>> Foley 4/29 >>> OGT 4/29 >>> PIV  CBC Latest Ref Rng & Units 05/25/2016 05/24/2016 05/23/2016  WBC 4.0 - 10.5 K/uL 15.5(H) 11.5(H) 12.7(H)  Hemoglobin 13.0 - 17.0 g/dL 12.5(L) 12.9(L) 12.9(L)  Hematocrit 39.0 - 52.0 % 42.4 43.4 44.6  Platelets 150 - 400 K/uL 162 149(L) 134(L)   BMP Latest Ref Rng & Units 05/25/2016 05/24/2016 05/24/2016  Glucose 65 - 99 mg/dL 145(H) 239(H) 227(H)  BUN 6 - 20 mg/dL 44(H) 49(H) 52(H)  Creatinine 0.61 - 1.24 mg/dL 0.64 0.71 0.74  Sodium 135 - 145 mmol/L 143 141 141  Potassium 3.5 - 5.1 mmol/L 3.1(L) 3.7 3.8  Chloride 101 - 111 mmol/L 91(L) 91(L) 90(L)  CO2 22 - 32 mmol/L 43(H) 43(H) 43(H)  Calcium 8.9 - 10.3 mg/dL 8.7(L) 8.9 8.9    Hepatic Function Latest Ref Rng & Units 05/25/2016 05/24/2016 02/23/2016  Total Protein 6.5 - 8.1 g/dL - - 6.6  Albumin 3.5 - 5.0 g/dL 2.6(L) 2.7(L) 3.2(L)  AST 15 - 41 U/L - - 13(L)  ALT 17 - 63 U/L - -  17  Alk Phosphatase 38 - 126 U/L - - 58  Total Bilirubin 0.3 - 1.2 mg/dL - - 0.6    IMAGING/STUDIES: PFT 06/10/15: FVC 2.94 L (56%) FEV1 1.13 L (28%) FEV1/FVC 0.39 FEF 25-75 0.3 L (11%) negative bronchodilator response TLC 6.09 L (82%) RV 142% ERV 37% DLCO uncorrected 55% BILAT LE VENOUS DUPLEX 4/30:  No DVT.  PORT CXR 5/5:  Previously reviewed by me. Endotracheal tube in good position. Right upper extremity PICC line with tip terminating in the superior vena cava. Bilateral lower lung opacities right greater than left suggestive of pleural effusions versus consolidation. Enteric feeding  tube coursing below diaphragm. PORT CXR 5/8:  Previously reviewed by me. Persistent bilateral lower lung opacities. Enteric feeding tube coursing below diaphragm. Endotracheal tube in good position.  MICROBIOLOGY: MRSA PCR 4/29:  Negative  Blood Cultures x2 4/29:  Negative  Tracheal Aspirate Culture 4/29:  Pseudomonas aeruginosa (pan sensitive) Respiratory Panel PCR 4/29:  Rhinovirus HIV 4/29:  Nonreactive  Urine Streptococcal Antigen 4/29:  Negative  Urine Legionella Antigen 4/29:  Negative  Blood Cultures x2 5/9 >>> Tracheal Aspirate Culture 5/9 >>>  ANTIBIOTICS: Cefepime 4/29 - 5/2 Tressie Ellis 5/2 - 5/5 Vancomycin 5/29 - 5/1; 5/9 >>> Zosyn 5/9 >>>  SIGNIFICANT EVENTS: 04/29 - Admit 05/09 - Febrile w/ repeat cultures obtained & empiric antibiotics started  ASSESSMENT/PLAN:  56 y.o.  male with morbid obesity and underlying OSA/OHS. Nonadherent to noninvasive positive pressure ventilation. Fever overnight with respiratory decompensation suspicious for new infection. Blood pressure is borderline. Degree of agitation somewhat improved on Precedex.   1. Acute on chronic hypoxic and hypercarbic respiratory failure: Continuing to wean FiO2 to minimize VQ mismatching. Continuing recruitment with PEEP of 14. Transitioning to Lasix IV every 12 hours given borderline blood pressure. Continuing Pulmicort nebulized twice daily & Duonebs every 6 hours. 2. New healthcare associated pneumonia versus tracheobronchitis: Repeat culture pending. Continuing empiric vancomycin and Zosyn. 3. Sepsis: Likely new tracheobronchitis versus healthcare associated pneumonia. Cultures pending. 4. Pseudomonas pneumonia: Status post treatment. 5. Acute encephalopathy: Likely delirium. Continuing Precedex infusion & weaning fentanyl drip. Versed IV as needed. 6. Atrial fibrillation: Continuing Cardizem 60 mg via tube every 6 hours. Monitoring patient on telemetry. 7. Constipation/ileus: Continuing senna and Colace  twice a day. Dulcolax suppository 1. 8. Hypokalemia/Hypomagnesemia:  Replaced. Monitoring electrolytes daily.  9. Hyponatremia: Resolved. 10. Diabetes mellitus type 2: Glucose control. Continuing Lantus and sliding scale insulin. Accu-Cheks every 4 hours. Holding outpatient oral Antihyperglycemic regimen. 11. Thrombocytopenia: Resolved. Likely splenic sequestration from sepsis.   Prophylaxis:  SCDs, Lovenox & Protonix via tube qhs.  Diet:  NPO. Continuing Tube Feedings.  Code Status:  Full Code per previous physician discussions.  Disposition:  Remains critically ill and endotracheally intubated.  Family Update:  No family at bedside during rounds.   I have personally spent a total of 33 minutes of critical care time today caring for the patient & reviewing the patient's electronic medical record.  Sonia Baller Ashok Cordia, M.D. [facility] Pulmonary & Critical Care Pager:  [phone] After 3pm or if no response, call [phone] 10:05 AM 05/25/16

## 2016-05-26 LAB — CBC WITH DIFFERENTIAL/PLATELET
BASOS ABS: 0 10*3/uL (ref 0.0–0.1)
BASOS PCT: 0 %
EOS ABS: 0 10*3/uL (ref 0.0–0.7)
EOS PCT: 0 %
HCT: 50.8 % (ref 39.0–52.0)
Hemoglobin: 15.2 g/dL (ref 13.0–17.0)
LYMPHS ABS: 1.6 10*3/uL (ref 0.7–4.0)
Lymphocytes Relative: 8 %
MCH: 29.4 pg (ref 26.0–34.0)
MCHC: 29.9 g/dL — ABNORMAL LOW (ref 30.0–36.0)
MCV: 98.3 fL (ref 78.0–100.0)
Monocytes Absolute: 1.9 10*3/uL — ABNORMAL HIGH (ref 0.1–1.0)
Monocytes Relative: 10 %
Neutro Abs: 16.3 10*3/uL — ABNORMAL HIGH (ref 1.7–7.7)
Neutrophils Relative %: 82 %
PLATELETS: 173 10*3/uL (ref 150–400)
RBC: 5.17 MIL/uL (ref 4.22–5.81)
RDW: 16 % — ABNORMAL HIGH (ref 11.5–15.5)
WBC: 19.8 10*3/uL — AB (ref 4.0–10.5)

## 2016-05-26 LAB — GLUCOSE, CAPILLARY
Glucose-Capillary: 212 mg/dL — ABNORMAL HIGH (ref 65–99)
Glucose-Capillary: 225 mg/dL — ABNORMAL HIGH (ref 65–99)
Glucose-Capillary: 251 mg/dL — ABNORMAL HIGH (ref 65–99)
Glucose-Capillary: 294 mg/dL — ABNORMAL HIGH (ref 65–99)
Glucose-Capillary: 263 mg/dL — ABNORMAL HIGH (ref 65–99)
Glucose-Capillary: 218 mg/dL — ABNORMAL HIGH (ref 65–99)

## 2016-05-26 LAB — CORTISOL: CORTISOL PLASMA: 21.2 ug/dL

## 2016-05-26 LAB — BLOOD CULTURE ID PANEL (REFLEXED)
ACINETOBACTER BAUMANNII: NOT DETECTED
CANDIDA ALBICANS: NOT DETECTED
CANDIDA GLABRATA: NOT DETECTED
CANDIDA KRUSEI: NOT DETECTED
CANDIDA PARAPSILOSIS: NOT DETECTED
Candida tropicalis: NOT DETECTED
ENTEROBACTER CLOACAE COMPLEX: NOT DETECTED
ENTEROBACTERIACEAE SPECIES: NOT DETECTED
ENTEROCOCCUS SPECIES: NOT DETECTED
ESCHERICHIA COLI: NOT DETECTED
Haemophilus influenzae: NOT DETECTED
KLEBSIELLA OXYTOCA: NOT DETECTED
Klebsiella pneumoniae: NOT DETECTED
LISTERIA MONOCYTOGENES: NOT DETECTED
Neisseria meningitidis: NOT DETECTED
PSEUDOMONAS AERUGINOSA: NOT DETECTED
Proteus species: NOT DETECTED
SERRATIA MARCESCENS: NOT DETECTED
STAPHYLOCOCCUS AUREUS BCID: NOT DETECTED
STREPTOCOCCUS PNEUMONIAE: NOT DETECTED
STREPTOCOCCUS PYOGENES: NOT DETECTED
Staphylococcus species: NOT DETECTED
Streptococcus agalactiae: NOT DETECTED
Streptococcus species: NOT DETECTED

## 2016-05-26 LAB — RENAL FUNCTION PANEL
ALBUMIN: 2.6 g/dL — AB (ref 3.5–5.0)
Anion gap: 13 (ref 5–15)
BUN: 63 mg/dL — AB (ref 6–20)
CALCIUM: 8.8 mg/dL — AB (ref 8.9–10.3)
CO2: 36 mmol/L — ABNORMAL HIGH (ref 22–32)
CREATININE: 1.08 mg/dL (ref 0.61–1.24)
Chloride: 95 mmol/L — ABNORMAL LOW (ref 101–111)
Glucose, Bld: 223 mg/dL — ABNORMAL HIGH (ref 65–99)
PHOSPHORUS: 4.1 mg/dL (ref 2.5–4.6)
Potassium: 4 mmol/L (ref 3.5–5.1)
SODIUM: 144 mmol/L (ref 135–145)

## 2016-05-26 LAB — MAGNESIUM: MAGNESIUM: 2.2 mg/dL (ref 1.7–2.4)

## 2016-05-26 MED ORDER — INSULIN NPH (HUMAN) (ISOPHANE) 100 UNIT/ML ~~LOC~~ SUSP
20.0000 [IU] | Freq: Four times a day (QID) | SUBCUTANEOUS | Status: DC
  Administered 2016-05-26 – 2016-05-27 (×3): 20 [IU] via SUBCUTANEOUS
  Filled 2016-05-26: qty 10

## 2016-05-26 MED ORDER — SODIUM CHLORIDE 0.9 % IV SOLN
0.0000 ug/min | INTRAVENOUS | Status: DC
  Administered 2016-05-26: 30 ug/min via INTRAVENOUS
  Administered 2016-05-26 – 2016-05-27 (×2): 20 ug/min via INTRAVENOUS
  Filled 2016-05-26 (×6): qty 1

## 2016-05-26 MED ORDER — HEPARIN SODIUM (PORCINE) 5000 UNIT/ML IJ SOLN
5000.0000 [IU] | Freq: Three times a day (TID) | INTRAMUSCULAR | Status: DC
  Administered 2016-05-27: 5000 [IU] via SUBCUTANEOUS
  Filled 2016-05-26 (×2): qty 1

## 2016-05-26 MED ORDER — MORPHINE SULFATE (PF) 2 MG/ML IV SOLN: 2.0000 mg | INTRAVENOUS | Status: DC | PRN

## 2016-05-26 NOTE — Progress Notes (Signed)
Pt's wife and daughter have told me to pass on to the MD that they would like to withdrawal care tomorrow (5/11) at 9:00 am. Dr. Darlin Priestlyamachadran made aware and new orders placed.

## 2016-05-26 NOTE — Progress Notes (Signed)
PHARMACY - PHYSICIAN COMMUNICATION CRITICAL VALUE ALERT - BLOOD CULTURE IDENTIFICATION (BCID)  Results for orders placed or performed during the hospital encounter of 04/24/2016  Blood Culture ID Panel (Reflexed) (Collected: 05/25/2016  5:26 AM)  Result Value Ref Range   Enterococcus species NOT DETECTED NOT DETECTED   Listeria monocytogenes NOT DETECTED NOT DETECTED   Staphylococcus species NOT DETECTED NOT DETECTED   Staphylococcus aureus NOT DETECTED NOT DETECTED   Streptococcus species NOT DETECTED NOT DETECTED   Streptococcus agalactiae NOT DETECTED NOT DETECTED   Streptococcus pneumoniae NOT DETECTED NOT DETECTED   Streptococcus pyogenes NOT DETECTED NOT DETECTED   Acinetobacter baumannii NOT DETECTED NOT DETECTED   Enterobacteriaceae species NOT DETECTED NOT DETECTED   Enterobacter cloacae complex NOT DETECTED NOT DETECTED   Escherichia coli NOT DETECTED NOT DETECTED   Klebsiella oxytoca NOT DETECTED NOT DETECTED   Klebsiella pneumoniae NOT DETECTED NOT DETECTED   Proteus species NOT DETECTED NOT DETECTED   Serratia marcescens NOT DETECTED NOT DETECTED   Haemophilus influenzae NOT DETECTED NOT DETECTED   Neisseria meningitidis NOT DETECTED NOT DETECTED   Pseudomonas aeruginosa NOT DETECTED NOT DETECTED   Candida albicans NOT DETECTED NOT DETECTED   Candida glabrata NOT DETECTED NOT DETECTED   Candida krusei NOT DETECTED NOT DETECTED   Candida parapsilosis NOT DETECTED NOT DETECTED   Candida tropicalis NOT DETECTED NOT DETECTED    On linezolid and meropenem empirically for fevers and pneumonia. Antibiotics were broadened yesterday. He has 1/2 blood cultures with [facility], nothing detected on BCID. It is appropriate to continue broad coverage pending identification of this organism. Discussed with pharmacist covering 22M today.  Changes to prescribed antibiotics required: None - continue linezolid and meropenem.  Sallee Provencalurner, Janese Radabaugh S 05/26/2016  1:19 PM

## 2016-05-26 NOTE — Progress Notes (Signed)
[place] Pulmonary & Critical Care Attending Note  Presenting HPI:  56 y.o. man with PMH asthma, afib on anticoag, morbid obesity, OSA (stopped cpap x 10mo), COPD on continuous O2 at home (per wife 7L x 1 yr), HLD, uncontrolled type II DM, [facility]. With EMS he was found to be in Afib with RVR and had wheezing. His mental status worsened during transport, he was given 2 nebs and solumedrol by EMS. Upon arrival to the ED he was in extremis with respiratory distress, obtunded and unable to provide a history. His O2 sat was 83% and improved to 99% with oxygen. He was afebrile and BP was ok. He was intubated due to hypercarbic respiratory failure and altered mental status, ABG showed pH 7.118, CO2 above reportable range for lab, and PO2 178. Chest xray showed interstitial edema and large right pleural effusion. He received 40 mg IV lasix, magnesium, and cardizem gtt for afib with rvr.   Subjective:  Vasopressor requirement decreasing overnight. Patient still with high FiO2 requirement. Patient did have a smear bowel movement today.   Review of Systems:  Unable to obtain given intubation & sedation.   Vent Mode: PRVC FiO2 (%):  [70 %] 70 % Set Rate:  [20 bmp] 20 bmp Vt Set:  [500 mL] 500 mL PEEP:  [14 cmH20] 14 cmH20 Plateau Pressure:  [23 cmH20-26 cmH20] 26 cmH20  Temp:  [101.5 F (38.6 C)-104.2 F (40.1 C)] 101.5 F (38.6 C) (05/10 1200) Pulse Rate:  [77-131] 85 (05/10 1200) Resp:  [19-32] 19 (05/10 1200) BP: (76-130)/(37-96) 103/68 (05/10 1200) SpO2:  [86 %-99 %] 98 % (05/10 1321) FiO2 (%):  [70 %] 70 % (05/10 1321) Weight:  [407 lb (184.6 kg)] 407 lb (184.6 kg) (05/10 0500)  General: Wife and daughter at bedside. Morbidly obese. No distress. Integument: Venous stasis changes bilateral lower extremities. No rash. HEENT: Dry mucous membranes. Endotracheal tube remains. No scleral icterus. Cardiovascular:  Irregular rhythm. Regular rate. Unable to appreciate JVD with body habitus. Pulmonary: Distant breath sounds bilaterally. Symmetric chest rise on ventilator. Abdomen: Soft. Protuberant. Hypoactive bowel sounds. Neurological: Patient spontaneously moving all 4 extremities. Seems to nod to questions but not consistently. Not following commands. Patient is looking at the ceiling and gesturing with his hand.  LINES/TUBES: OETT 4/29 >>> RUE TLC PICC 5/1 >>> Foley 4/29 >>> OGT 4/29 >>> PIV  CBC Latest Ref Rng & Units 05/26/2016 05/25/2016 05/24/2016  WBC 4.0 - 10.5 K/uL 19.8(H) 15.5(H) 11.5(H)  Hemoglobin 13.0 - 17.0 g/dL 15.2 12.5(L) 12.9(L)  Hematocrit 39.0 - 52.0 % 50.8 42.4 43.4  Platelets 150 - 400 K/uL 173 162 149(L)   BMP Latest Ref Rng & Units 05/26/2016 05/25/2016 05/25/2016  Glucose 65 - 99 mg/dL 223(H) 158(H) 145(H)  BUN 6 - 20 mg/dL 63(H) 58(H) 44(H)  Creatinine 0.61 - 1.24 mg/dL 1.08 1.00 0.64  Sodium 135 - 145 mmol/L 144 143 143  Potassium 3.5 - 5.1 mmol/L 4.0 3.6 3.1(L)  Chloride 101 - 111 mmol/L 95(L) 98(L) 91(L)  CO2 22 - 32 mmol/L 36(H) 36(H) 43(H)  Calcium 8.9 - 10.3 mg/dL 8.8(L) 8.1(L) 8.7(L)    Hepatic Function Latest Ref Rng & Units 05/26/2016 05/25/2016 05/24/2016  Total Protein 6.5 - 8.1 g/dL - - -  Albumin 3.5 - 5.0 g/dL 2.6(L) 2.6(L) 2.7(L)  AST 15 - 41 U/L - - -  ALT 17 - 63 U/L - - -  Alk  Phosphatase 38 - 126 U/L - - -  Total Bilirubin 0.3 - 1.2 mg/dL - - -    IMAGING/STUDIES: PFT 06/10/15: FVC 2.94 L (56%) FEV1 1.13 L (28%) FEV1/FVC 0.39 FEF 25-75 0.3 L (11%) negative bronchodilator response TLC 6.09 L (82%) RV 142% ERV 37% DLCO uncorrected 55% BILAT LE VENOUS DUPLEX 4/30:  No DVT.  PORT CXR 5/5:  Previously reviewed by me. Endotracheal tube in good position. Right upper extremity PICC line with tip terminating in the superior vena cava. Bilateral lower lung opacities right greater than left suggestive of pleural effusions versus consolidation. Enteric feeding tube  coursing below diaphragm. PORT CXR 5/8:  Previously reviewed by me. Persistent bilateral lower lung opacities. Enteric feeding tube coursing below diaphragm. Endotracheal tube in good position.  MICROBIOLOGY: MRSA PCR 4/29:  Negative  Blood Cultures x2 4/29:  Negative  Tracheal Aspirate Culture 4/29:  Pseudomonas aeruginosa (pan sensitive) Respiratory Panel PCR 4/29:  Rhinovirus HIV 4/29:  Nonreactive  Urine Streptococcal Antigen 4/29:  Negative  Urine Legionella Antigen 4/29:  Negative  Blood Cultures x2 5/9 >>> Tracheal Aspirate Culture 5/9 >>>  ANTIBIOTICS: Cefepime 4/29 - 5/2 Tressie Ellis 5/2 - 5/5 Vancomycin 5/29 - 5/1; 5/9 >>> Zosyn 5/9 >>>  SIGNIFICANT EVENTS: 04/29 - Admit 05/09 - Febrile w/ repeat cultures obtained & empiric antibiotics started  ASSESSMENT/PLAN:  56 y.o.  male with morbid obesity and underlying OSA/OHS. Still with significant oxygen requirement. Level of interaction improving. Hopefully ileus is improving as well.   1. Acute on chronic hypoxic and hypercarbic respiratory failure: No significant improvement overnight. Continuing to target FiO2 to minimize VQ mismatching. Continuing gentle diuresis with Lasix IV every 12 hours. Continuing Pulmicort nebulized twice daily and Duonebs every 6 hours. 2. New healthcare associated pneumonia versus tracheobronchitis: Repeat culture pending. Continuing empiric vancomycin and Zosyn. 3. Sepsis with shock: Likely pulmonary source. Cultures pending. Continuing to titrate vasopressor infusion. Checking serum cortisol. Acute on chronic hypoxic and hypercarbic respiratory failure: Continuing to wean FiO2 to minimize VQ mismatching. Continuing recruitment with PEEP of 14. Transitioning to Lasix IV every 12 hours given borderline blood pressure. Continuing Pulmicort nebulized twice daily & Duonebs every 6 hours. 4. New healthcare associated pneumonia versus tracheobronchitis: Repeat culture pending. Continuing empiric vancomycin and  Zosyn. 5. Acute encephalopathy: Likely strong component of delirium. Minimizing sedatives. Continuing Precedex infusion. 6. Atrial fibrillation: Continuing Cardizem q6hr. Monitoring patient on telemetry. 7. Constipation/ileus: Continuing senna and Colace twice a day. Holding on further suppository. Possible component of calcium channel blocker induced dysfunction. 8. Pseudomonas pneumonia: Status post treatment. 9. Hypokalemia/hypomagnesemia: Resolved. 10. Hyponatremia: Resolved. 11. Diabetes mellitus type 2: Glucose uncontrolled. Starting NPH 20 units subcutaneous every 6 hours. Discontinuing Lantus. Continuing Accu-Cheks every 4 hours with sliding scale insulin. 12. Thrombocytopenia: Resolved. Likely splenic sequestration with sepsis.  Prophylaxis:  SCDs, Heparin [place] q8hr, & Protonix via tube qhs. D/C Lovenox. Diet:  NPO. Holding Tube Feedings.  Code Status:  Full Code per previous physician discussions.  Disposition:  Remains critically ill and endotracheally intubated.  Family Update:  Family updated during rounds today. See Plan of Care note.   I have spent a total of 39 minutes of critical care time today caring for the patient, updating family at bedside, and reviewing the patient's electronic medical record.   Sonia Baller Ashok Cordia, M.D. [facility] Pulmonary & Critical Care Pager:  [phone] After 3pm or if no response, call [phone] 1:37 PM 05/26/16

## 2016-05-26 NOTE — Progress Notes (Signed)
eLink Physician-Brief Progress Note Patient Name: Brett BondsRandy P Curry DOB: Jun 20, 1960 MRN: [id number]   Date of Service  05/26/2016  HPI/Events of Note  Noted family meeting note re: goals of care earlier today. Family now decided they would like to plan for one way extubation 5/11 @ 9am.   eICU Interventions  Will place orders for extubation tomorrow when family ready and transition to comfort measures only.         Shane Crutchradeep Tobby Fawcett 05/26/2016, 5:36 PM

## 2016-05-26 NOTE — Plan of Care (Signed)
  Interdisciplinary Goals of Care Family Meeting   Date carried out:: 05/26/2016  Location of the meeting: Bedside  Member's involved: Physician, Bedside Registered Nurse and Family Member or next of kin  Durable Power of Attorney or acting medical decision maker: Wife    Discussion: We discussed [facility] .  Lengthy discussion with the patient's wife and daughter at bedside. Explained his decline in respiratory status and ongoing septic shock. Discussed likely need for tracheostomy and PEG tube placement. Both feel the patient would not wish to have these procedures done nor would he wish to be maintained and his current situation. We discussed transitioning to full palliation with one way extubation. Family wished to discuss this further and then return with a decision.   Code status: Full Code  Disposition: Continue current acute care  Time spent for the meeting: 8 minutes   Lawanda CousinsJennings Marialena Wollen 05/26/2016, 2:06 PM

## 2016-05-27 LAB — RENAL FUNCTION PANEL
ALBUMIN: 2.5 g/dL — AB (ref 3.5–5.0)
Anion gap: 14 (ref 5–15)
BUN: 85 mg/dL — AB (ref 6–20)
CO2: 34 mmol/L — ABNORMAL HIGH (ref 22–32)
Calcium: 8.3 mg/dL — ABNORMAL LOW (ref 8.9–10.3)
Chloride: 99 mmol/L — ABNORMAL LOW (ref 101–111)
Creatinine, Ser: 0.93 mg/dL (ref 0.61–1.24)
GFR calc Af Amer: >60 mL/min (ref >60)
GFR calc non Af Amer: >60 mL/min (ref >60)
GLUCOSE: 170 mg/dL — AB (ref 65–99)
POTASSIUM: 3.4 mmol/L — AB (ref 3.5–5.1)
Phosphorus: 4.2 mg/dL (ref 2.5–4.6)
Sodium: 147 mmol/L — ABNORMAL HIGH (ref 135–145)

## 2016-05-27 LAB — CBC WITH DIFFERENTIAL/PLATELET
BASOS ABS: 0 10*3/uL (ref 0.0–0.1)
Basophils Relative: 0 %
Eosinophils Absolute: 0 10*3/uL (ref 0.0–0.7)
Eosinophils Relative: 0 %
HEMATOCRIT: 47.7 % (ref 39.0–52.0)
Hemoglobin: 14.3 g/dL (ref 13.0–17.0)
LYMPHS ABS: 2.1 10*3/uL (ref 0.7–4.0)
Lymphocytes Relative: 15 %
MCH: 29.4 pg (ref 26.0–34.0)
MCHC: 30 g/dL (ref 30.0–36.0)
MCV: 98.1 fL (ref 78.0–100.0)
MONO ABS: 1 10*3/uL (ref 0.1–1.0)
MONOS PCT: 7 %
NEUTROS ABS: 10.9 10*3/uL — AB (ref 1.7–7.7)
Neutrophils Relative %: 78 %
PLATELETS: 124 10*3/uL — AB (ref 150–400)
RBC: 4.86 MIL/uL (ref 4.22–5.81)
RDW: 16.3 % — AB (ref 11.5–15.5)
WBC: 14 10*3/uL — ABNORMAL HIGH (ref 4.0–10.5)

## 2016-05-27 LAB — GLUCOSE, CAPILLARY
GLUCOSE-CAPILLARY: 83 mg/dL (ref 65–99)
GLUCOSE-CAPILLARY: 97 mg/dL (ref 65–99)
Glucose-Capillary: 125 mg/dL — ABNORMAL HIGH (ref 65–99)
Glucose-Capillary: 69 mg/dL (ref 65–99)
Glucose-Capillary: 100 mg/dL — ABNORMAL HIGH (ref 65–99)

## 2016-05-27 LAB — MAGNESIUM: MAGNESIUM: 2.3 mg/dL (ref 1.7–2.4)

## 2016-05-27 MED ORDER — METOPROLOL TARTRATE 5 MG/5ML IV SOLN
5.0000 mg | Freq: Four times a day (QID) | INTRAVENOUS | Status: DC
  Administered 2016-05-27 (×2): 5 mg via INTRAVENOUS
  Filled 2016-05-27 (×5): qty 5

## 2016-05-27 MED ORDER — METOPROLOL TARTRATE 5 MG/5ML IV SOLN
2.5000 mg | INTRAVENOUS | Status: DC | PRN
  Administered 2016-05-27 – 2016-05-28 (×2): 2.5 mg via INTRAVENOUS
  Administered 2016-05-28: 5 mg via INTRAVENOUS
  Administered 2016-05-28: 2.5 mg via INTRAVENOUS
  Filled 2016-05-27 (×4): qty 5

## 2016-05-27 MED ORDER — MORPHINE SULFATE (PF) 2 MG/ML IV SOLN
1.0000 mg | INTRAVENOUS | Status: DC | PRN
  Administered 2016-05-27: 1 mg via INTRAVENOUS
  Filled 2016-05-27: qty 1
  Filled 2016-05-27 (×2): qty 5

## 2016-05-27 MED ORDER — MORPHINE SULFATE (PF) 2 MG/ML IV SOLN: 1.0000 mg | INTRAVENOUS | Status: DC | PRN

## 2016-05-27 MED ORDER — POTASSIUM CHLORIDE 20 MEQ/15ML (10%) PO SOLN
40.0000 meq | Freq: Once | ORAL | Status: AC
  Administered 2016-05-27: 40 meq
  Filled 2016-05-27: qty 30

## 2016-05-27 MED ORDER — MORPHINE SULFATE (PF) 4 MG/ML IV SOLN
1.0000 mg | INTRAVENOUS | Status: DC | PRN
  Administered 2016-05-28: 4 mg via INTRAVENOUS
  Administered 2016-05-28: 8 mg via INTRAVENOUS
  Administered 2016-05-28 (×2): 4 mg via INTRAVENOUS
  Administered 2016-05-28: 8 mg via INTRAVENOUS
  Administered 2016-05-28: 4 mg via INTRAVENOUS
  Filled 2016-05-27: qty 2
  Filled 2016-05-27: qty 1
  Filled 2016-05-27 (×3): qty 2

## 2016-05-27 MED ORDER — BISACODYL 10 MG RE SUPP: 10.0000 mg | Freq: Every day | RECTAL | Status: DC | PRN

## 2016-05-27 MED ORDER — INSULIN ASPART 100 UNIT/ML ~~LOC~~ SOLN: 0.0000 [IU] | SUBCUTANEOUS | Status: DC

## 2016-05-27 MED ORDER — DILTIAZEM HCL 60 MG PO TABS
60.0000 mg | ORAL_TABLET | Freq: Four times a day (QID) | ORAL | Status: DC
  Administered 2016-05-27: 60 mg via ORAL
  Filled 2016-05-27: qty 1

## 2016-05-27 MED ORDER — DEXTROSE 50 % IV SOLN
INTRAVENOUS | Status: AC
  Administered 2016-05-27: 25 mL
  Filled 2016-05-27: qty 50

## 2016-05-27 NOTE — Care Management Note (Signed)
Case Management Note  Patient Details  Name: Brett Curry MRN: [id number] Date of Birth: 1960/09/05  Subjective/Objective:  Pt admitted with RF                  Action/Plan:  PTA from home - discharge home with supplemental oxygen (CM requested [facility] to verify pt home liter flow ).  Pt is now ventilated .  CM will continue to follow for discharge needs   Expected Discharge Date:                  Expected Discharge Plan:     In-House Referral:     Discharge planning Services  CM Consult  Post Acute Care Choice:    Choice offered to:     DME Arranged:    DME Agency:     HH Arranged:    HH Agency:     Status of Service:     If discussed at [employer] Meetings, dates discussed:    Additional Comments: 05/27/2016 Pt one way extubated today.  IV morphine pushes Cherylann ParrClaxton, Vashon Arch S, RN 05/27/2016, 11:47 AM

## 2016-05-27 NOTE — Progress Notes (Signed)
eLink Physician-Brief Progress Note Patient Name: Narda BondsRandy P Vassey DOB: 1960-10-15 MRN: [id number]   Date of Service  05/27/2016  HPI/Events of Note    eICU Interventions  KCl given     Intervention Category Minor Interventions: Electrolytes abnormality - evaluation and management  Massa Pe S. 05/27/2016, 5:47 AM

## 2016-05-27 NOTE — Progress Notes (Addendum)
Hypoglycemic Event  CBG: 69  Treatment: D50 IV 25 mL  Symptoms: Sweaty  Follow-up CBG: Time: 2050 CBG Result: 100  Possible Reasons for Event: Inadequate meal intake  Comments/MD notified:    Veto KempsWilson, Treveon Bourcier R

## 2016-05-27 NOTE — Progress Notes (Signed)
eLink Physician-Brief Progress Note Patient Name: Brett BondsRandy P Curry DOB: Aug 02, 1960 MRN: [id number]   Date of Service  05/27/2016  HPI/Events of Note  AF + RVR One way extubated  eICU Interventions  Lopressor prn Resume PO cardizem - can take PO per RN     Intervention Category Intermediate Interventions: Arrhythmia - evaluation and management  ALVA,RAKESH V. 05/27/2016, 3:37 PM

## 2016-05-27 NOTE — Progress Notes (Signed)
[facility] Attending Note  Presenting HPI:  56 y.o. man with PMH asthma, afib on anticoag, morbid obesity, OSA (stopped cpap x 3mo), COPD on continuous O2 at home (per wife 7L x 1 yr), HLD, uncontrolled type II DM, [facility]. With EMS he was found to be in Afib with RVR and had wheezing. His mental status worsened during transport, he was given 2 nebs and solumedrol by EMS. Upon arrival to the ED he was in extremis with respiratory distress, obtunded and unable to provide a history. His O2 sat was 83% and improved to 99% with oxygen. He was afebrile and BP was ok. He was intubated due to hypercarbic respiratory failure and altered mental status, ABG showed pH 7.118, CO2 above reportable range for lab, and PO2 178. Chest xray showed interstitial edema and large right pleural effusion. He received 40 mg IV lasix, magnesium, and cardizem gtt for afib with rvr.   Subjective:  After family discussion yesterday family decided on one way extubation and palliation this morning. Patient was extubated prior to my arrival in the room.   Review of Systems:  Unable to obtain given altered mentation.   Vent Mode: PRVC FiO2 (%):  [60 %-70 %] 60 % Set Rate:  [20 bmp] 20 bmp Vt Set:  [500 mL] 500 mL PEEP:  [12 cmH20-14 cmH20] 12 cmH20 Plateau Pressure:  [20 cmH20-26 cmH20] 20 cmH20  Temp:  [96.4 F (35.8 C)-101.7 F (38.7 C)] 97 F (36.1 C) (05/11 1015) Pulse Rate:  [66-112] 94 (05/11 1015) Resp:  [18-32] 30 (05/11 1015) BP: (73-119)/(51-84) 108/82 (05/11 1015) SpO2:  [87 %-100 %] 90 % (05/11 1015) FiO2 (%):  [60 %-70 %] 60 % (05/11 0828) Weight:  [412 lb (186.9 kg)] 412 lb (186.9 kg) (05/11 0438)  Gen.: Multiple family members at bedside. Morbid obesity. No distress. Integument: No rash on exposed skin. Venous stasis changes bilateral lower extremities. Cardiovascular: Irregular rhythm. Regular  rate. No JVD appreciated given body habitus. Pulmonary: Distant breath sounds. Normal work of breathing on high flow nasal cannula oxygen. Abdomen: Soft. Protuberant. Hypoactive bowel sounds. Neurological: Attends to voice. Disoriented and not aware of appropriate date or president. Spontaneously moving extremities.  LINES/TUBES: OETT 4/29 - 5/11 OGT 4/29 - 5/11 RUE TLC PICC 5/1 >>> Foley 4/29 >>> PIV  CBC Latest Ref Rng & Units 05/27/2016 05/26/2016 05/25/2016  WBC 4.0 - 10.5 K/uL 14.0(H) 19.8(H) 15.5(H)  Hemoglobin 13.0 - 17.0 g/dL 14.3 15.2 12.5(L)  Hematocrit 39.0 - 52.0 % 47.7 50.8 42.4  Platelets 150 - 400 K/uL 124(L) 173 162   BMP Latest Ref Rng & Units 05/27/2016 05/26/2016 05/25/2016  Glucose 65 - 99 mg/dL 170(H) 223(H) 158(H)  BUN 6 - 20 mg/dL 85(H) 63(H) 58(H)  Creatinine 0.61 - 1.24 mg/dL 0.93 1.08 1.00  Sodium 135 - 145 mmol/L 147(H) 144 143  Potassium 3.5 - 5.1 mmol/L 3.4(L) 4.0 3.6  Chloride 101 - 111 mmol/L 99(L) 95(L) 98(L)  CO2 22 - 32 mmol/L 34(H) 36(H) 36(H)  Calcium 8.9 - 10.3 mg/dL 8.3(L) 8.8(L) 8.1(L)    Hepatic Function Latest Ref Rng & Units 05/27/2016 05/26/2016 05/25/2016  Total Protein 6.5 - 8.1 g/dL - - -  Albumin 3.5 - 5.0 g/dL 2.5(L) 2.6(L) 2.6(L)  AST 15 - 41 U/L - - -  ALT 17 - 63 U/L - - -  Alk Phosphatase 38 - 126 U/L - - -  Total Bilirubin 0.3 - 1.2 mg/dL - - -    IMAGING/STUDIES: PFT 06/10/15: FVC 2.94 L (56%) FEV1 1.13 L (28%) FEV1/FVC 0.39 FEF 25-75 0.3 L (11%) negative bronchodilator response TLC 6.09 L (82%) RV 142% ERV 37% DLCO uncorrected 55% BILAT LE VENOUS DUPLEX 4/30:  No DVT.  PORT CXR 5/5:  Previously reviewed by me. Endotracheal tube in good position. Right upper extremity PICC line with tip terminating in the superior vena cava. Bilateral lower lung opacities right greater than left suggestive of pleural effusions versus consolidation. Enteric feeding tube coursing below diaphragm. PORT CXR 5/8:  Previously reviewed by me. Persistent  bilateral lower lung opacities. Enteric feeding tube coursing below diaphragm. Endotracheal tube in good position.  MICROBIOLOGY: MRSA PCR 4/29:  Negative  Blood Cultures x2 4/29:  Negative  Tracheal Aspirate Culture 4/29:  Pseudomonas aeruginosa (pan sensitive) Respiratory Panel PCR 4/29:  Rhinovirus HIV 4/29:  Nonreactive  Urine Streptococcal Antigen 4/29:  Negative  Urine Legionella Antigen 4/29:  Negative  Blood Cultures x2 5/9 >>> Tracheal Aspirate Culture 5/9 >>>  ANTIBIOTICS: Cefepime 4/29 - 5/2 Tressie Ellis 5/2 - 5/5 Vancomycin 5/29 - 5/1; 5/9 (off after 1 dose) Zosyn 5/9 (x1 dose) Merrem 5/9>>> Zyvox 5/9 >>>  SIGNIFICANT EVENTS: 04/29 - Admit 05/09 - Febrile w/ repeat cultures obtained & empiric antibiotics started  ASSESSMENT/PLAN:  56 y.o.  male with morbid obesity and underlying OSA/OHS. Family decided on one way extubation this morning. After some discussion with the patient ultimately the decision was made to extubate the patient and proceed towards discomfort. Multiple family members are at bedside at this time.  1. Acute on chronic hypoxic and hypercarbic respiratory failure: Status post one way extubation. Continuing supplemental oxygen for patient comfort. Morphine IV every 15 minutes when necessary increased work of breathing. 2. New healthcare associated pneumonia/sepsis: Continuing empiric Zyvox and Merrem for now. 3. Shock: Likely secondary to sepsis. Continuing low-dose vasopressor infusion but not escalating to allow for more time visiting with family. 4. Acute encephalopathy: Multifactorial from hypoxia, hypercarbia, and sepsis. 5. Goals of care: Family members and wife all updated at bedside. Goal is to focus on patient's comfort. As such we will not be using noninvasive positive pressure ventilation. Continuing nebulizer treatments as needed.   Prophylaxis:  D/C heparin & Protonix. Diet:  NPO.  Code Status:  DNR. Disposition:  Leaving in the ICU to palliate  patient.  Family Update:  Wife and multiple family members updated at bedside. Goal is patient's comfort.   I have spent a total of 31 minutes of critical care time today caring for the patient, updating family at bedside, and reviewing the patient's electronic medical record.   Sonia Baller Ashok Cordia, M.D. [facility] Critical [facility] Pager:  [phone] After 3pm or if no response, call [phone] 10:40 AM 05/27/16

## 2016-05-27 NOTE — Progress Notes (Signed)
Per MD order for one way extubation to titrate oxygen to pt comfort. Dr Celene SkeenNester at the pts bedside and states pt looks comfortable. Pt is awake and can follow commands. RT extubated pt to 10L HFNC.

## 2016-05-27 NOTE — Progress Notes (Signed)
   05/27/16 1318  Clinical Encounter Type  Visited With Patient and family together  Visit Type Initial;Patient actively dying  Referral From Patient  Consult/Referral To Chaplain  Spiritual Encounters  Spiritual Needs Prayer;Emotional;Other (Comment) (active listening)  Stress Factors  Patient Stress Factors Exhausted;Family relationships  Family Stress Factors Family relationships  Pt and family requesting prayer, pt is DNR and withdrawal of care, pt and family at peace and accepting.  Offered a prayer, emotional support, active listening, ministry of presence.   Chaplain Temari Schooler A. Kennedy BuckerLunsford,  MA-PC, [phone]

## 2016-05-27 NOTE — Procedures (Signed)
Extubation Procedure Note  Patient Details:   Name: Brett Curry DOB: 05-31-1960 MRN: [id number]   Airway Documentation:     Evaluation  O2 sats: 89  Complications: No apparent complications Patient did tolerate procedure well. Bilateral Breath Sounds: Clear, Diminished   Yes. Per MD order for one way extubation to oxygen for pt comfort. Dr Celene SkeenNester at the pts bedside and states he looks comfortable. Pt in no distress. Pt awake and writing notes along with talking to Dr Celene SkeenNester. RT extubated pt to 10L HFNC for comfort. Pt seems comfortable at this point. RN and MD at the pts bedside.  Brett Curry, Brett Curry 05/27/2016, 10:59 AM

## 2016-05-28 DIAGNOSIS — J151 Pneumonia due to Pseudomonas: Secondary | ICD-10-CM

## 2016-05-28 LAB — BASIC METABOLIC PANEL
Anion gap: 8 (ref 5–15)
BUN: 67 mg/dL — AB (ref 6–20)
CALCIUM: 8.5 mg/dL — AB (ref 8.9–10.3)
CHLORIDE: 102 mmol/L (ref 101–111)
CO2: 42 mmol/L — ABNORMAL HIGH (ref 22–32)
CREATININE: 0.84 mg/dL (ref 0.61–1.24)
Glucose, Bld: 120 mg/dL — ABNORMAL HIGH (ref 65–99)
Potassium: 3.6 mmol/L (ref 3.5–5.1)
SODIUM: 152 mmol/L — AB (ref 135–145)

## 2016-05-28 LAB — GLUCOSE, CAPILLARY
GLUCOSE-CAPILLARY: 120 mg/dL — AB (ref 65–99)
GLUCOSE-CAPILLARY: 112 mg/dL — AB (ref 65–99)
GLUCOSE-CAPILLARY: 117 mg/dL — AB (ref 65–99)
Glucose-Capillary: 130 mg/dL — ABNORMAL HIGH (ref 65–99)

## 2016-05-28 LAB — CULTURE, BLOOD (ROUTINE X 2)

## 2016-05-28 MED ORDER — IPRATROPIUM BROMIDE 0.02 % IN SOLN: 0.5000 mg | Freq: Four times a day (QID) | RESPIRATORY_TRACT | Status: DC

## 2016-05-28 MED ORDER — IPRATROPIUM-ALBUTEROL 0.5-2.5 (3) MG/3ML IN SOLN: 3.0000 mL | RESPIRATORY_TRACT | Status: DC | PRN

## 2016-05-28 MED ORDER — METOPROLOL TARTRATE 5 MG/5ML IV SOLN: 2.5000 mg | Freq: Once | INTRAVENOUS | Status: DC

## 2016-05-28 MED ORDER — IPRATROPIUM BROMIDE 0.02 % IN SOLN
0.5000 mg | Freq: Four times a day (QID) | RESPIRATORY_TRACT | Status: DC
  Administered 2016-05-28: 0.5 mg via RESPIRATORY_TRACT
  Filled 2016-05-28 (×2): qty 2.5

## 2016-05-28 MED ORDER — DILTIAZEM HCL 100 MG IV SOLR
5.0000 mg/h | INTRAVENOUS | Status: DC
  Administered 2016-05-28: 5 mg/h via INTRAVENOUS
  Administered 2016-05-28: 15 mg/h via INTRAVENOUS
  Filled 2016-05-28 (×2): qty 100

## 2016-05-28 NOTE — Progress Notes (Signed)
Arrived to pt room to find wife, medical family member and many family/friends anticipating and wanting to know when morphine drip will be started. Wife and medical family member verbalize desire for morphine drip and withdrawal of antibiotic and oxygen therapy. This rn notes labored rr (unchanged from previous) and no apparent visible discomfort, pt wife and medical family member in agreement with assesment. Both members comment they understood RN from previous shift to say waiting for pharmacy to send morphine drip. Wife comments  "she is tired and has been her all day". Reassured family morphine ivp can be given every 15 minutes as needed. Will administer morphine ivp now and notify elink md of family current understanding and desires, family in agreement.

## 2016-05-28 NOTE — Progress Notes (Signed)
RN notified about pt's increased WOB.  

## 2016-05-28 NOTE — Progress Notes (Signed)
PCCM Attending Family Update:  Patient's wife, brother-in-law, and daughter updated at bedside upon arrival. Respiratory status slowly deteriorating. Patient opens eyes briefly. Appears comfortable. Continuing to use morphine IV intermittently for patient's comfort.  Donna ChristenJennings E. Jamison NeighborNestor, M.D. [facility] Pager:  [phone] After 3pm or if no response, call [phone] 11:30 AM 05/28/16

## 2016-05-28 NOTE — Progress Notes (Signed)
[facility] Attending Note  Presenting HPI:  56 y.o. man with PMH asthma, afib on anticoag, morbid obesity, OSA (stopped cpap x 68mo), COPD on continuous O2 at home (per wife 7L x 1 yr), HLD, uncontrolled type II DM, [facility]. With EMS he was found to be in Afib with RVR and had wheezing. His mental status worsened during transport, he was given 2 nebs and solumedrol by EMS. Upon arrival to the ED he was in extremis with respiratory distress, obtunded and unable to provide a history. His O2 sat was 83% and improved to 99% with oxygen. He was afebrile and BP was ok. He was intubated due to hypercarbic respiratory failure and altered mental status, ABG showed pH 7.118, CO2 above reportable range for lab, and PO2 178. Chest xray showed interstitial edema and large right pleural effusion. He received 40 mg IV lasix, magnesium, and cardizem gtt for afib with rvr.   Subjective:  Patient remained on high flow nasal cannula oxygen overnight. Increased work of breathing noted this morning. Tachycardia worsened with atrial fibrillation this morning. Patient received intermittent Lopressor overnight for his tachycardia.   Review of Systems:  Unable to obtain given altered mentation.   Temp:  [96.6 F (35.9 C)-99.1 F (37.3 C)] 98.2 F (36.8 C) (05/12 0748) Pulse Rate:  [74-137] 134 (05/12 0700) Resp:  [19-43] 31 (05/12 0700) BP: (91-130)/(55-99) 116/91 (05/12 0700) SpO2:  [81 %-96 %] 90 % (05/12 0817) FiO2 (%):  [12 %] 12 % (05/12 0400) Weight:  [404 lb (183.3 kg)] 404 lb (183.3 kg) (05/12 0500)  Gen.: No family at bedside. Morbidly obese. No distress. Integument: No rash on exposed skin. Venous stasis unchanged. Cardiovascular: Tachycardic with irregular rhythm. Atrial fibrillation on telemetry. Pulmonary: Moderately increased work of breathing. Slightly diminished breath sounds in bilateral lung  bases. Currently on high flow nasal cannula oxygen. Abdomen: Soft. Protuberant. Normoactive bowel sounds. Neurological: Moving all 4 extremities equally. Remains altered. Not following commands or answering questions. Eyes open and patient awake.   LINES/TUBES: OETT 4/29 - 5/11 OGT 4/29 - 5/11 RUE TLC PICC 5/1 >>> Foley 4/29 >>> PIV  CBC Latest Ref Rng & Units 05/27/2016 05/26/2016 05/25/2016  WBC 4.0 - 10.5 K/uL 14.0(H) 19.8(H) 15.5(H)  Hemoglobin 13.0 - 17.0 g/dL 14.3 15.2 12.5(L)  Hematocrit 39.0 - 52.0 % 47.7 50.8 42.4  Platelets 150 - 400 K/uL 124(L) 173 162   BMP Latest Ref Rng & Units 05/28/2016 05/27/2016 05/26/2016  Glucose 65 - 99 mg/dL 120(H) 170(H) 223(H)  BUN 6 - 20 mg/dL 67(H) 85(H) 63(H)  Creatinine 0.61 - 1.24 mg/dL 0.84 0.93 1.08  Sodium 135 - 145 mmol/L 152(H) 147(H) 144  Potassium 3.5 - 5.1 mmol/L 3.6 3.4(L) 4.0  Chloride 101 - 111 mmol/L 102 99(L) 95(L)  CO2 22 - 32 mmol/L 42(H) 34(H) 36(H)  Calcium 8.9 - 10.3 mg/dL 8.5(L) 8.3(L) 8.8(L)    Hepatic Function Latest Ref Rng & Units 05/27/2016 05/26/2016 05/25/2016  Total Protein 6.5 - 8.1 g/dL - - -  Albumin 3.5 - 5.0 g/dL 2.5(L) 2.6(L) 2.6(L)  AST 15 - 41 U/L - - -  ALT 17 - 63 U/L - - -  Alk Phosphatase 38 - 126 U/L - - -  Total Bilirubin 0.3 - 1.2 mg/dL - - -    IMAGING/STUDIES: PFT 06/10/15: FVC 2.94 L (56%) FEV1 1.13 L (28%) FEV1/FVC 0.39 FEF 25-75 0.3  L (11%) negative bronchodilator response TLC 6.09 L (82%) RV 142% ERV 37% DLCO uncorrected 55% BILAT LE VENOUS DUPLEX 4/30:  No DVT.  PORT CXR 5/5:  Previously reviewed by me. Endotracheal tube in good position. Right upper extremity PICC line with tip terminating in the superior vena cava. Bilateral lower lung opacities right greater than left suggestive of pleural effusions versus consolidation. Enteric feeding tube coursing below diaphragm. PORT CXR 5/8:  Previously reviewed by me. Persistent bilateral lower lung opacities. Enteric feeding tube coursing below  diaphragm. Endotracheal tube in good position.  MICROBIOLOGY: MRSA PCR 4/29:  Negative  Blood Cultures x2 4/29:  Negative  Tracheal Aspirate Culture 4/29:  Pseudomonas aeruginosa (pan sensitive) Respiratory Panel PCR 4/29:  Rhinovirus HIV 4/29:  Nonreactive  Urine Streptococcal Antigen 4/29:  Negative  Urine Legionella Antigen 4/29:  Negative  Blood Cultures x2 5/9 >>> 1/2 bottles Coag Neg Staph Tracheal Aspirate Culture 5/9 >>> Abundant Pseudomonas  ANTIBIOTICS: Cefepime 4/29 - 5/2 Tressie Ellis 5/2 - 5/5 Vancomycin 5/29 - 5/1; 5/9 (off after 1 dose) Zosyn 5/9 (x1 dose) Zyvox 5/9 - 5/12 Merrem 5/9>>>  SIGNIFICANT EVENTS: 04/29 - Admit 05/09 - Febrile w/ repeat cultures obtained & empiric antibiotics started 05/11 - One-Way Extubation per family wishes  ASSESSMENT/PLAN:  56 y.o.   male with morbid obesity and underlying OSA/OHS. Patient underwent one way extubation yesterday. Respiratory status is tenuous and patient remains altered. Overall appears comfortable. Has Pseudomonas pneumonia on culture. Currently in atrial fibrillation with rapid ventricular response.  1. Acute on chronic hypoxic and hypercarbic respiratory failure: Status post one way extubation. Continuing morphine as needed for patient comfort. Continuing supplemental oxygen. Scheduling Atrovent nebulized every 6 hours. 2. Pseudomonas pneumonia: Continuing treatment with meropenem. Discontinuing Zyvox. 3. Shock: Resolved. Continuing to monitor vitals. Protocol. 4. Atrial fibrillation with rapid ventricular response: No response with scheduled IV Lopressor. Starting diltiazem infusion. Monitoring patient on telemetry. 5. Acute encephalopathy: Multifactorial from hypoxia, hypercarbia, and sepsis. 6. Diabetes mellitus: Continuing Accu-Cheks monitoring for hypoglycemia.  Prophylaxis: SCDs. Diet:  Sips & ice chips.  Code Status:  DNR. Disposition:  Leaving in the ICU to palliate patient.  Family Update:  Daughter updated  via phone. Continuing to treat patient but also focusing on his comfort.   I have spent a total of 37 minutes of time today caring for the patient, reviewing the patient's electronic medical record, and with more than 50% of that time spent coordinating care with the patient and nurse as well as reviewing the continuing plan of care with the family via phone.  Sonia Baller Ashok Cordia, M.D. [facility] Pulmonary & Critical Care Pager:  [phone] After 3pm or if no response, call [phone] 8:35 AM 05/28/16

## 2016-05-28 NOTE — Progress Notes (Signed)
eLink Physician-Brief Progress Note Patient Name: Brett Curry DOB: 1960-11-25 MRN: [id number]   Date of Service  05/28/2016  HPI/Events of Note  received call from patient's wife ,s he is very upset and crying because "she is tired of waiting for her husband to die", she says she was told he would die in mins after stopping ventilator and that didn't happen so she is very upset. She is requesting morphine gtt. She also wants to cut down oxygen and stop other intervention  eICU Interventions  I informed patient's wife that patient looks very comfortable and so there is no indication to start morphine drip when he is comfortable with intermittent pushes. From notes from dr Jamison Neighbornestor, looks like plan is for comfort care and palliation. Nurse inquired if ok to give morphine if BP low. Advised yes to do so as goal is comfort care and it is known that morphine in such situations may drop BP and that is an acceptable side effect as the goal is comfort care . Patient's wife is tired and upset and angry for having to wait so long for his death, she informed me "he was supposed to die yesterday."        [name] 05/28/2016, 8:38 PM

## 2016-05-28 NOTE — Progress Notes (Addendum)
Patient's condition been deteriorating since morning, he was able to follow simple commands this morning and been opening eyes on stimulation this afternoon. At 17:50 he brady down to 30's and came back to 90's in 3mts, pt doesn't respont to any stimuli at this point. Family in the room, talked to them about his condition and they are ready to start full comfort care with morphine drip as per their conversation with MD before extubation (verbalized "tired of being waiting"). [facility] to [facility], waiting for orders.   Danne HarborJohny,RN

## 2016-05-28 NOTE — Progress Notes (Signed)
Pharmacy Antibiotic Note Brett Curry is a 56 y.o. male admitted on 05/10/2016 with pneumonia. He continued to spike fevers on vancomycin and Zosyn and was changed to zyvox and meropenem on 5/9. Respiratory cultures are positive for pseudomonas and 1/2 blood cultures are positive for CoNS. Zyvox was discontinued, pt remains on meropenem at this time.    Plan: Meropenem 2 g IV q8h Follow up cultures and sensitivities, renal fx, deescalation   Height: 5\' 9"  (175.3 cm) Weight: (!) 404 lb (183.3 kg) IBW/kg (Calculated) : 70.7  Temp (24hrs), Avg:97.6 F (36.4 C), Min:96.6 F (35.9 C), Max:99.1 F (37.3 C)   Recent Labs Lab 05/23/16 0418 05/24/16 0410  05/25/16 0440 05/25/16 1817 05/26/16 0205 05/27/16 0239 05/28/16 0301  WBC 12.7* 11.5*  --  15.5*  --  19.8* 14.0*  --   CREATININE 0.62 0.62  < > 0.64 1.00 1.08 0.93 0.84  < > = values in this interval not displayed.  Estimated Creatinine Clearance: 160.7 mL/min (by C-G formula based on SCr of 0.84 mg/dL).      Zyvox 5/9 >> 5/12 Meropenem 5/9 >>  Zosyn 5/9 x1 Vanc 4/29 >> 5/1; 5/9 x1 Cefepime 4/29 >> 5/2 Elita QuickFortaz 5/2 >> 5/5    4/29 MRSA PCR: negative 4/29 Strep pneumo: negative 4/29 Resp panel: + Rhinovirus/Enterovirus 4/29 BCx x2 - NGTD 4/29 TA - Pseudomonas (pan sensitive) 5/9 blood cx: 1/2 CoNS 5/9 resp cx: pseudomonas    Agapito GamesAlison Shlome Baldree, PharmD, BCPS Clinical Pharmacist 05/28/2016 10:04 AM

## 2016-05-29 ENCOUNTER — Telehealth: Payer: Self-pay | Admitting: Pulmonary Disease

## 2016-05-29 LAB — CULTURE, RESPIRATORY W GRAM STAIN: Special Requests: NORMAL

## 2016-05-29 LAB — CULTURE, RESPIRATORY: GRAM STAIN: NONE SEEN

## 2016-05-30 ENCOUNTER — Telehealth: Payer: Self-pay

## 2016-05-30 LAB — GLUCOSE, CAPILLARY: GLUCOSE-CAPILLARY: 188 mg/dL — AB (ref 65–99)

## 2016-05-30 LAB — CULTURE, BLOOD (ROUTINE X 2): Culture: NO GROWTH

## 2016-05-30 NOTE — Telephone Encounter (Signed)
On 05/30/16 I received a death certificate from [facility] (faxed). The death certificate is for cremation. The patient is a patient of Designer, television/film setDoctor Nestor. [facility]. On 05/30/2016 I received the death certificate back from Doctor ClintonNestor. I got the death certificate ready and faxed the death certificate to the funeral home per the funeral home request.

## 2016-06-09 ENCOUNTER — Telehealth: Payer: Self-pay

## 2016-06-09 NOTE — Telephone Encounter (Signed)
On 06/09/16 I [facility] The death certificate is for cremation. The patient is a patient of Designer, television/film setDoctor Nestor. The death certificate will be taken to [facility] on Tuesday [phone]) when Jamison Neighborestor comes back from vacation. On 06/15/16 I received the death certificate back from Doctor BastropNestor. I got the death certificate ready and called the funeral home to let them know I mailed the d/c to [facility] [employer] per the funeral home request.

## 2016-06-17 NOTE — Progress Notes (Signed)
Pt with periods of apnea and agonal rr pulse not palpable. HR 60s progressed to 30-40s with pauses and progression to asystole. Asystole verified in different lead. No audible heart or breath sounds auscultated and no palpable pulse verified with Alessandra BevelsJennifer M, RN, time of death [id number]. This RN notified pt spouse, spouse states family with no plans to visit. Oxygen, lines and catheters removed intact and morgue care done.

## 2016-06-17 NOTE — Telephone Encounter (Signed)
Contacted by funeral home as family wishes to cremate patient. Funeral home wishes to have death certificate completed as soon as possible. I provided them with the fax number to the medical ICU. We'll attempt to get this completed today.

## 2016-06-17 NOTE — Discharge Summary (Signed)
DEATH NOTE: For a complete accounting of the patient's history and physical exam on presentation please refer to the H&P dictated on 04/26/2016. In brief the patient was a 56 year old male with history of asthma, atrial fibrillation, morbid obesity, COPD, hyperlipidemia, and uncontrolled diabetes mellitus. He presented to outside hospital on 05/04/2016 with respiratory distress for at least a few days. Patient's mental status progressively worsened during his transport and he was noted to be in atrial fibrillation with rapid ventricular response. Upon arrival in the emergency department the patient was then extremities and obtunded unable to provide further history. Patient was subsequently intubated upon arrival for acute hypoxic and hypercarbic respiratory failure. Patient was subsequently transferred to our facility due to his critical illness on same day as presentation. The patient had known underlying OSA/OHS but was not adherent with noninvasive positive pressure ventilation. He was started on broad-spectrum antibiotic coverage with cefepime and vancomycin initially. He completed a seven-day course of treatment for Pseudomonas cultured from his tracheal aspirate on 4/29. Patient's respiratory viral panel PCR also showed rhinovirus. Patient's antibiotic regimen for Pseudomonas ended on 5/5. On 5/9 he became febrile and repeat cultures were obtained with reinitiation of broad-spectrum antibiotics. Patient was started on meropenem that same day. Repeat tracheal aspirate culture again showed Pseudomonas that was now resistant to [facility] with intermediate sensitivity to cefepime. The sensitivity pattern was different from his initial culture. The isolate was sensitive to carbapenems. I had a long discussion with the patient's wife and daughter at bedside on 5/10 with patient on full ventilator support requiring FiO2 0.7 and positive end expiratory pressure 14 cm H2O to maintain marginal saturation. At that time the  patient was in a state of shock on vasopressor support with continued encephalopathy. Patient had subsequently developed an ileus likely due to his severity of illness. We discussed his current degree of respiratory failure and the fact that he was reaching the point where we would have to make a decision regarding goals of care. Both agreed patient would not wish to undergo tracheostomy and PEG tube placement due to previous experience with a family member. We briefly discussed switching over to comfort care with a terminal ventilator wean and one way extubation. Later on in the evening the patient's family discussed transitioning to comfort care the next morning with electronic ICU intensivist. The order was placed and on the morning of 5/11 family notified nursing staff to proceed with ventilator wean and one way extubation. Morphine was ordered IV push to assist with patient's comfort. After extubation the patient had mild to moderately increased work of breathing on high flow nasal cannula oxygen but overall appeared comfortable with intermittent morphine. Family were updated at bedside by myself. He remained encephalopathic. Patient's antibiotics were continued for his comfort as well as oxygen therapy. We continued to support the patient with medication attempting to control his rapid ventricular response from atrial fibrillation. Family were updated on 5/12 at bedside as the patient survived the night but showed signs of some respiratory fatigue. Patient's clinical state continued to deteriorate and he developed periods of apnea. Review of the patient's electronic medical record indicates he progressed to bradycardia with periods of pauses and eventual asystole on telemetry. Nurse documentation shows no palpable pulse or audible breath or heart sounds on auscultation at 00:09 am on 06-04-16. Patient's spouse was notified by nursing staff.  DIAGNOSES AT DEATH: 1. Acute on chronic hypoxic and hypercarbic  respiratory failure 2. OSA/OHS 3. Septic shock 4. Atrial fibrillation with rapid  ventricular response 5. Acute encephalopathy 6. Ileus 7. Thrombocytopenia 8. Pseudomonas pneumonia 9. Morbid obesity 10. Essential hypertension 11. Uncontrolled Diabetes mellitus type 2 12. Hyperlipidemia 13. Very severe COPD

## 2016-06-17 DEATH — deceased

## 2016-07-08 ENCOUNTER — Ambulatory Visit: Payer: Medicare Other | Admitting: Podiatry

## 2017-06-27 IMAGING — CR DG CHEST 1V PORT
1 series · 4 of 4 positions shown · non-contrast
Comparison: Chest radiograph February 25, 2016

CLINICAL DATA: Respiratory distress, endotracheal tube placement.

EXAM:
PORTABLE CHEST 1 VIEW

[Series 1: portable · 0.17mm/px · 4 of 4 slices shown]
[im 1/4]
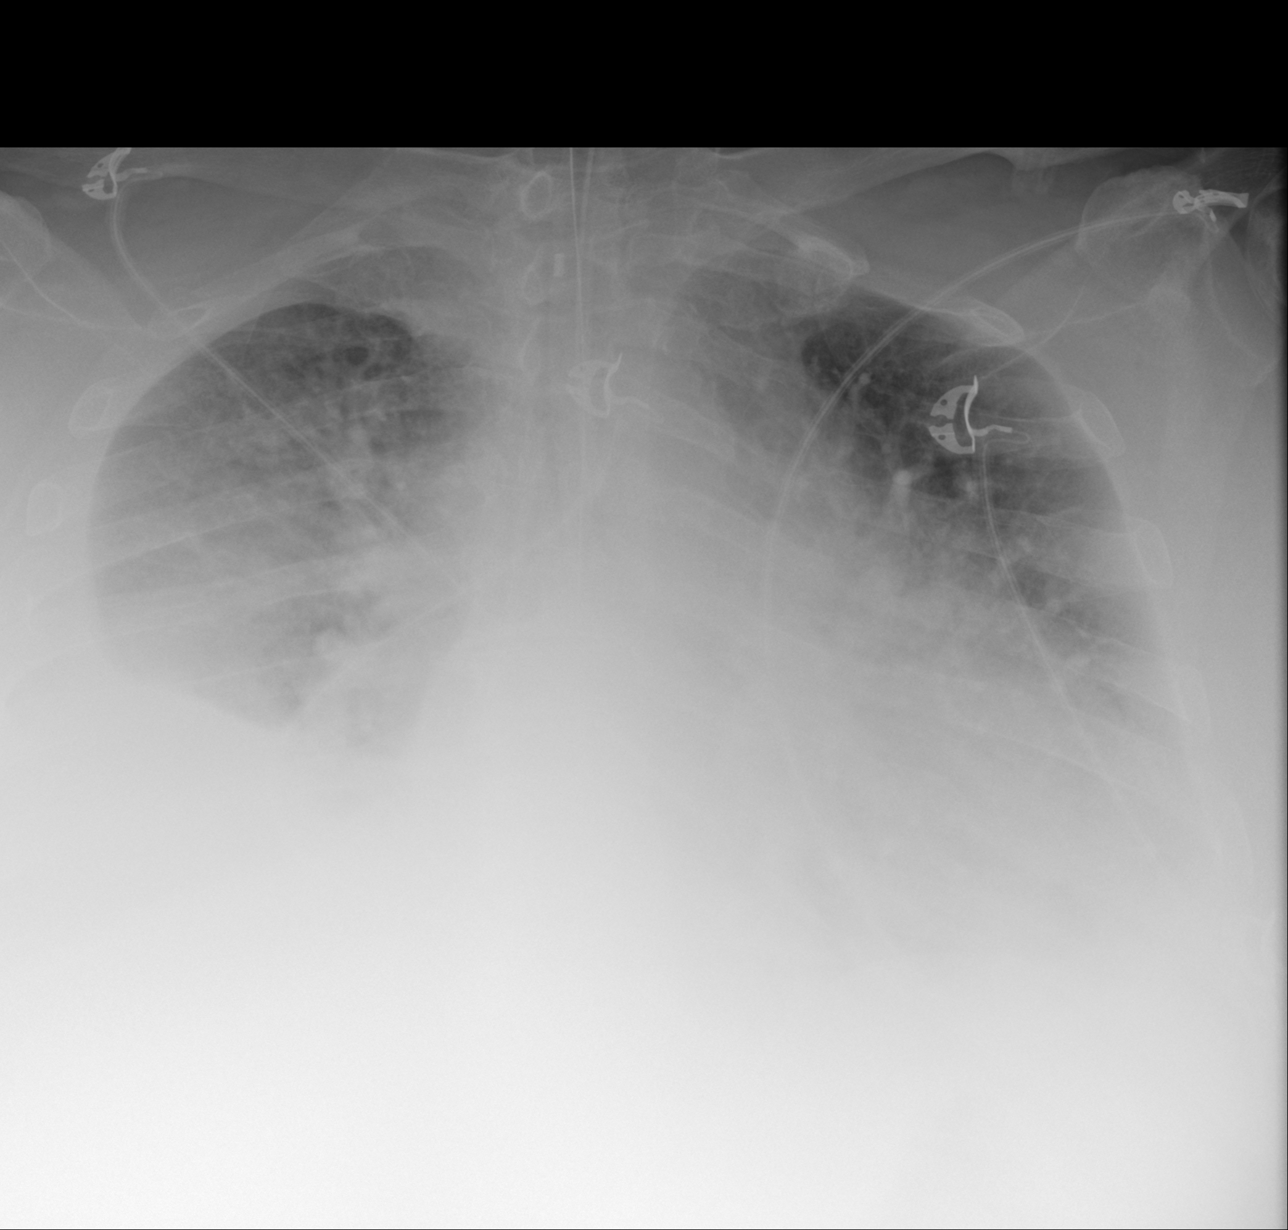
[im 2/4]
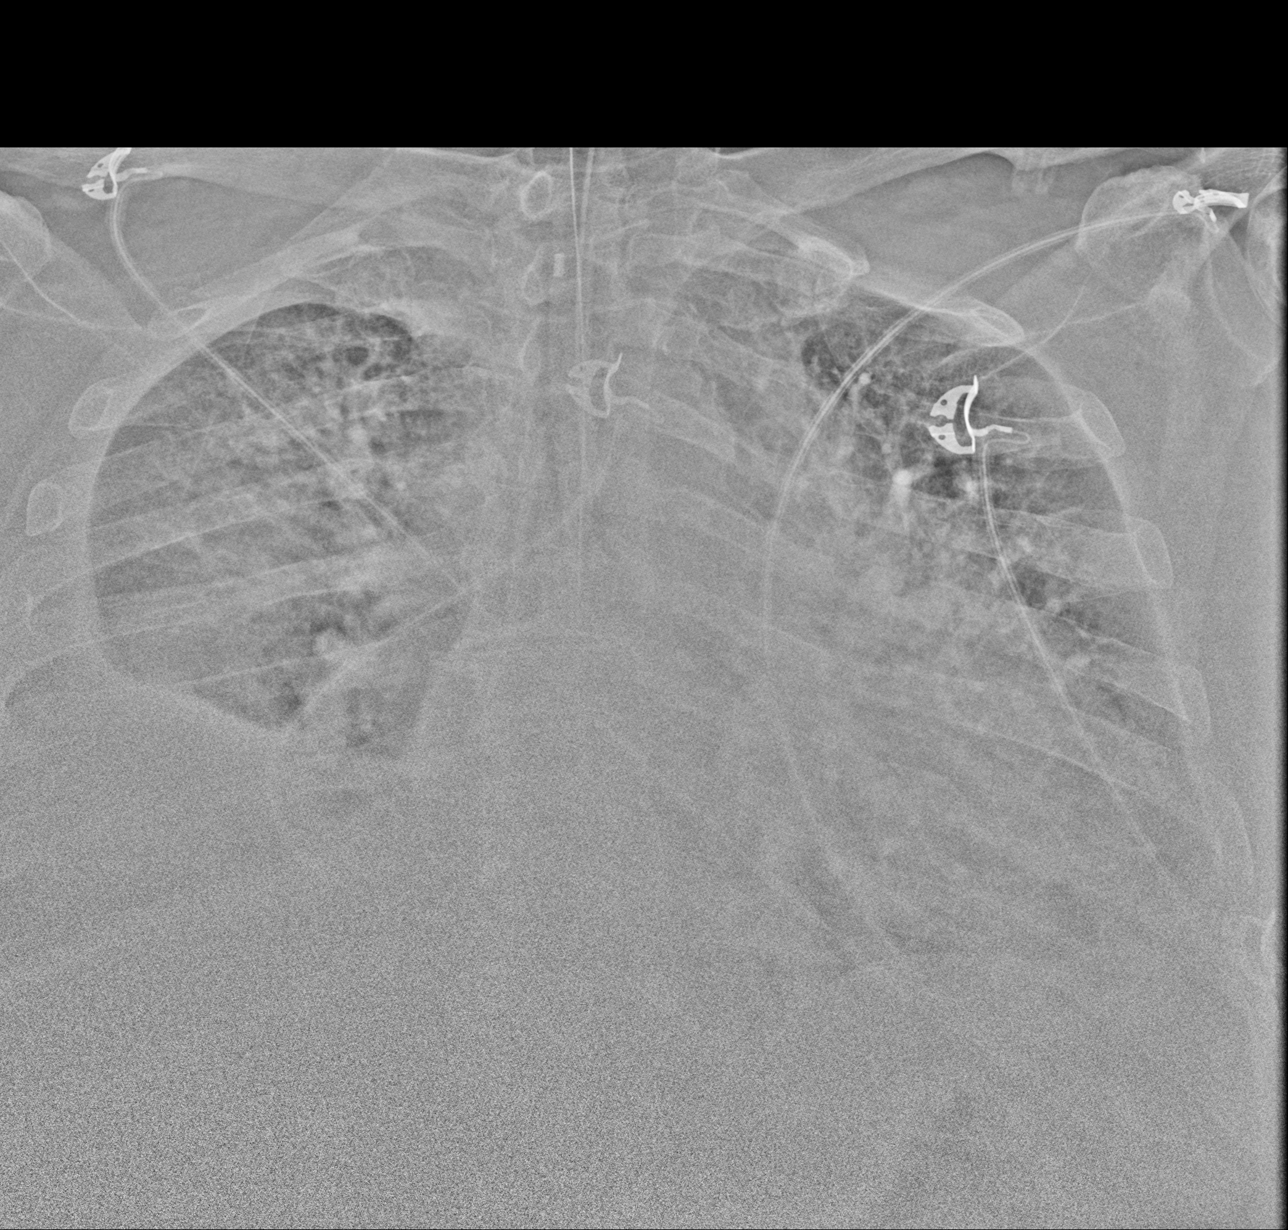
[im 3/4]
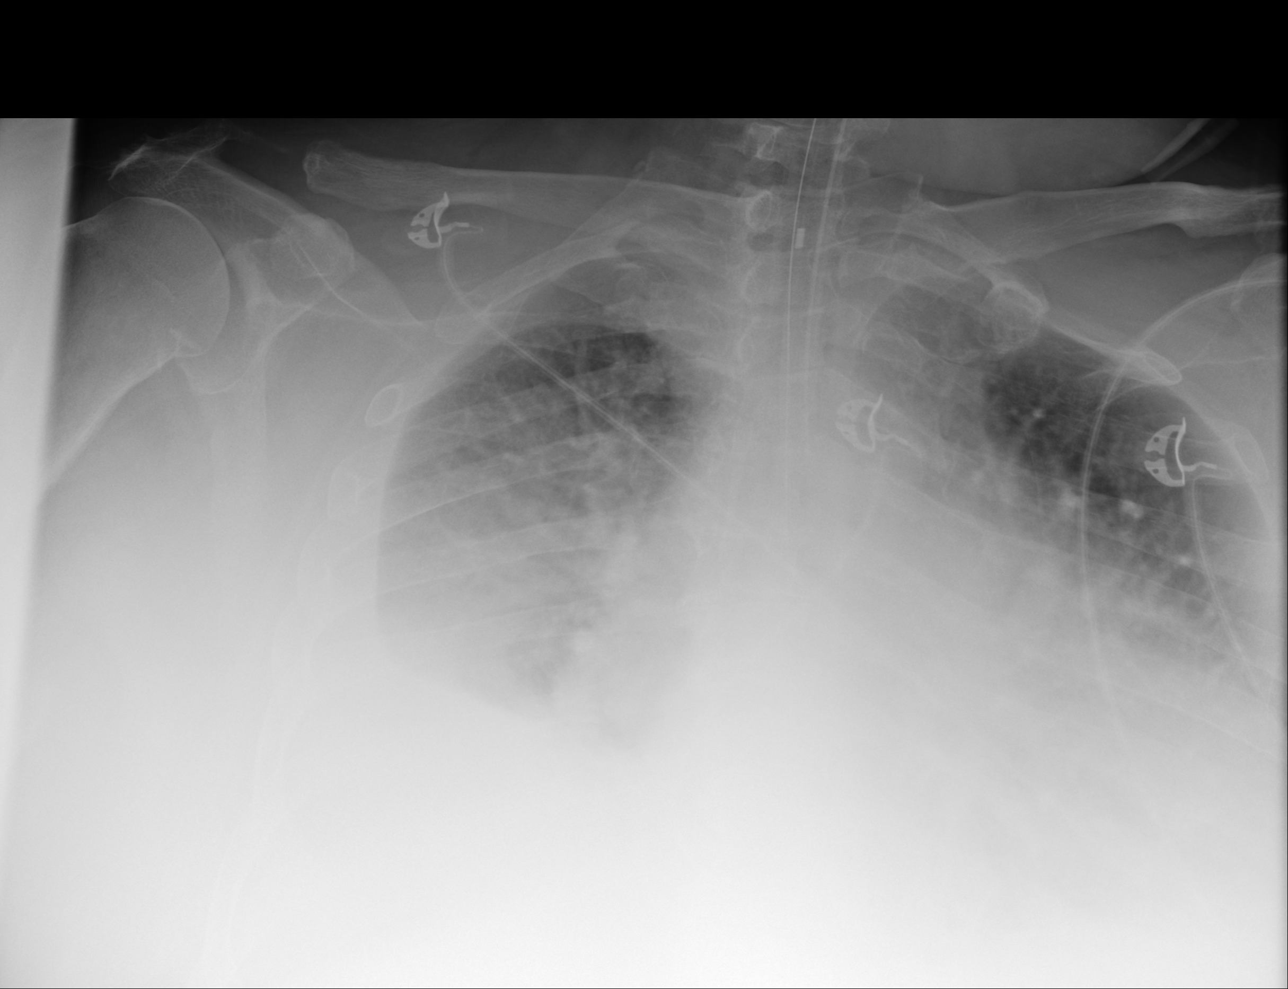
[im 4/4]
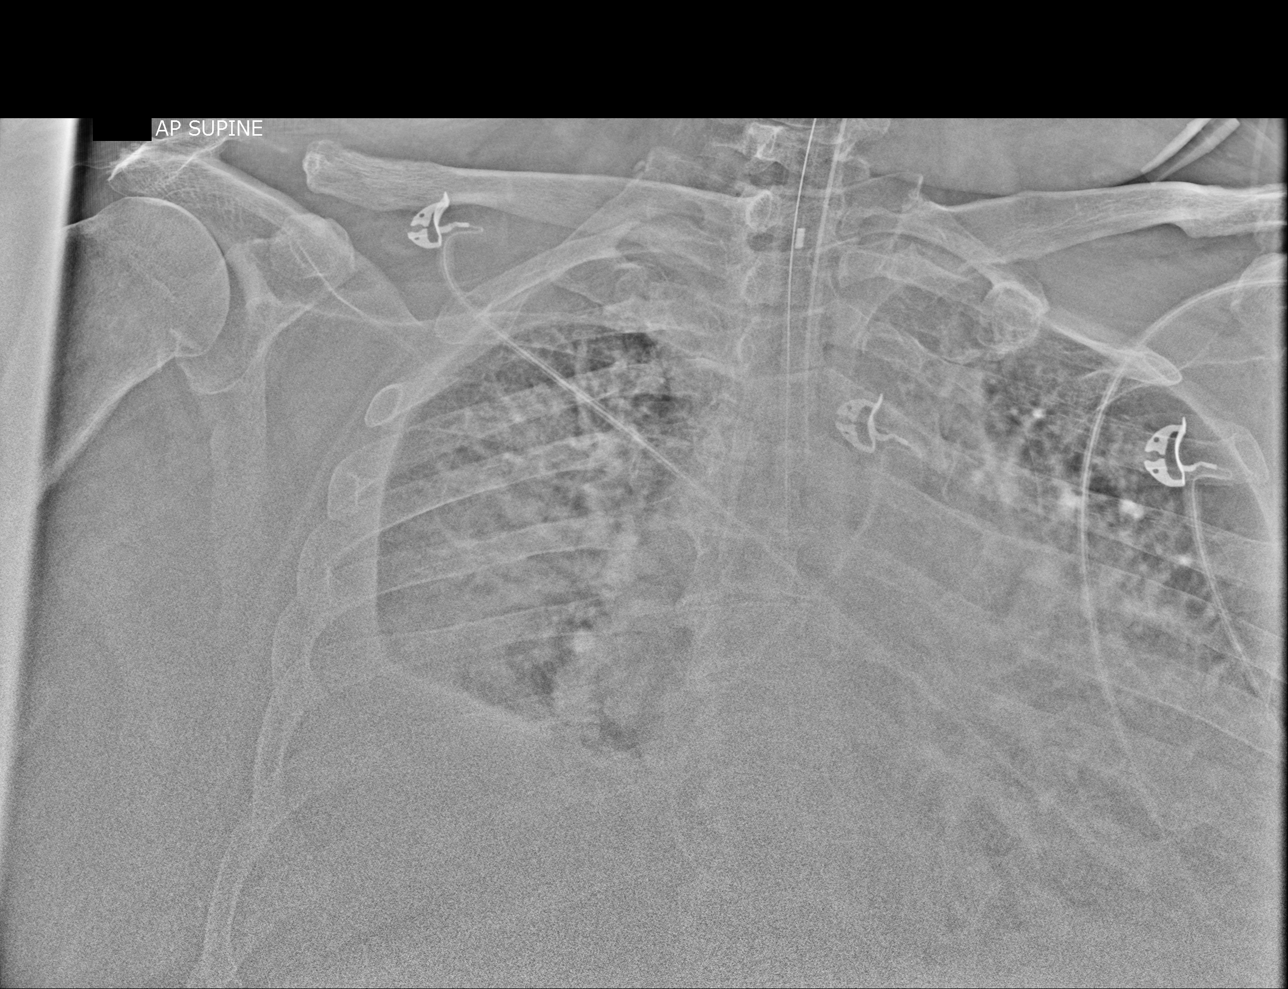

[4 of 4 positions shown; findings below may reference images not displayed]

FINDINGS: Large body habitus limits evaluation. Endotracheal tube tip projects
1.9 cm above the carina. Naso gastric tube in place, distal tip
difficult to visualize.

The cardiac silhouette appears moderately enlarged. Low inspiratory
examination with moderate to large RIGHT pleural effusion and
pulmonary vascular congestion/interstitial edema. No pneumothorax.
Resected distal LEFT clavicle.
IMPRESSION: Habitus limited examination.

Endotracheal tube projects 1.9 cm above the carina, nasogastric tube
distal aspect not well characterized.

Moderate to severe cardiomegaly and pulmonary interstitial edema.
Moderate to large layering RIGHT pleural effusion.

## 2017-07-02 IMAGING — DX DG CHEST 1V PORT
2 series · 2 of 2 positions shown · non-contrast
Comparison: 05/19/2016

CLINICAL DATA: 55-year-old male with a history of respiratory
failure

EXAM:
PORTABLE CHEST 1 VIEW

[chest ap (1 of 2)]
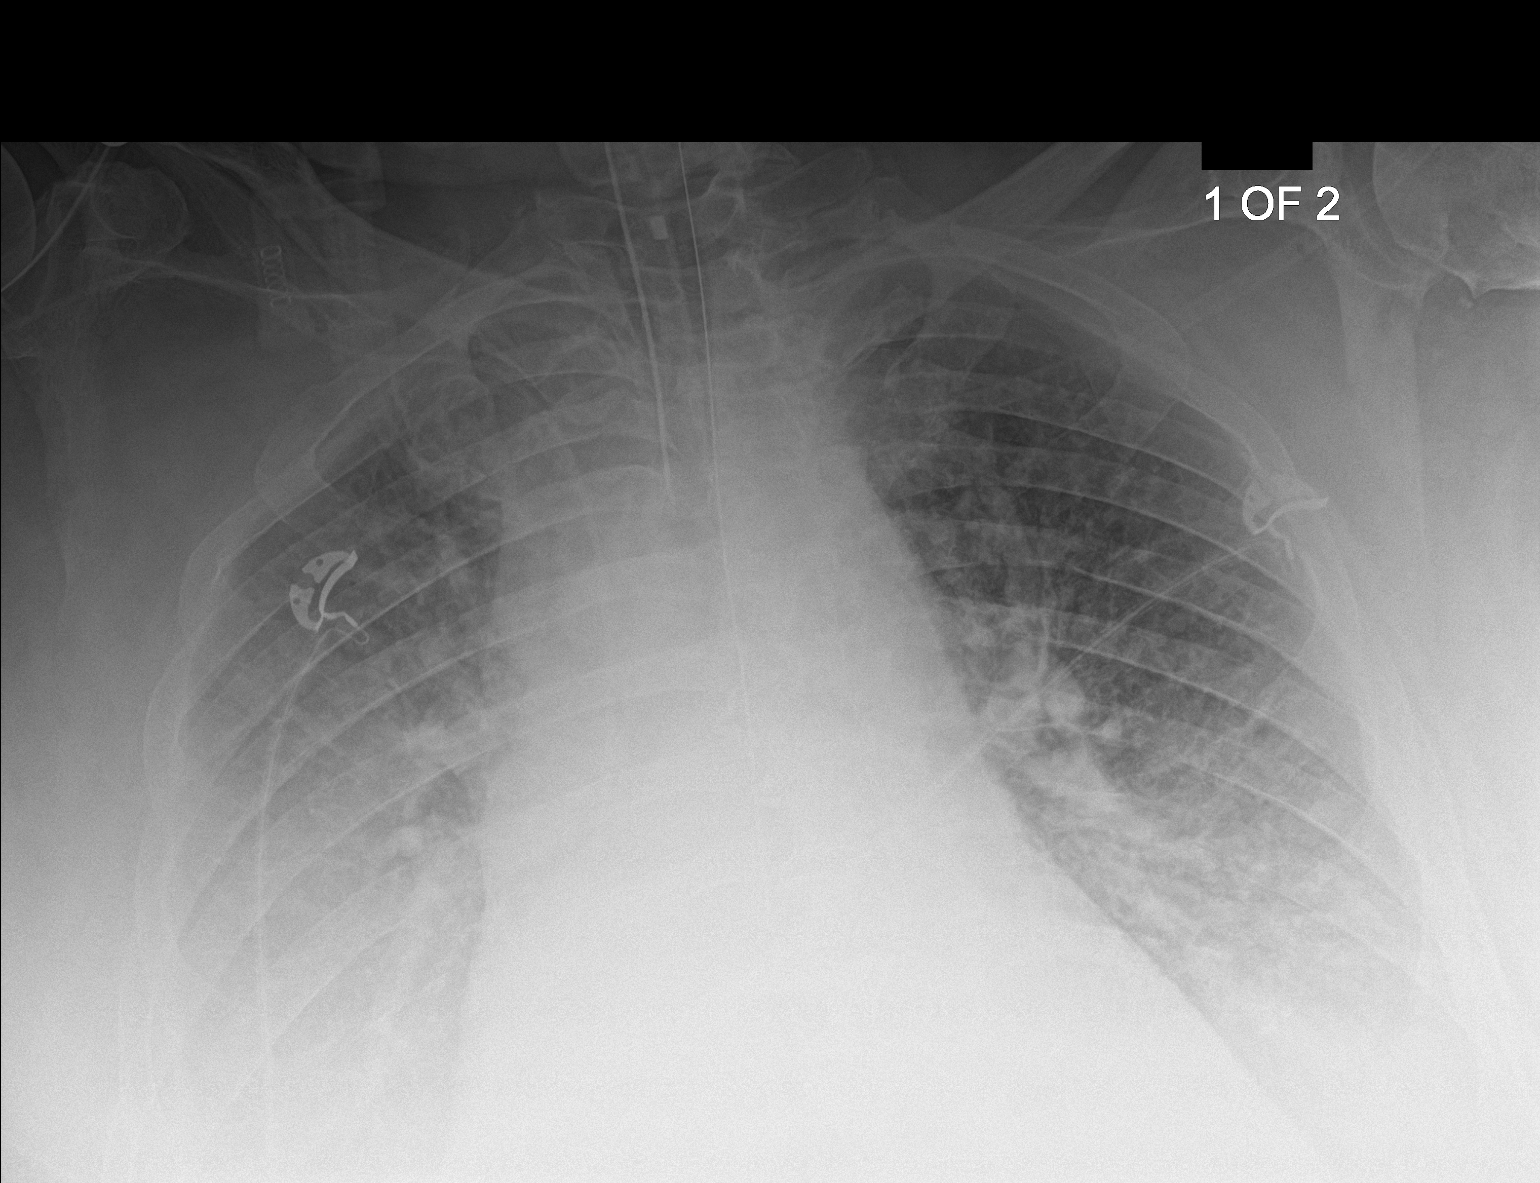

[chest ap (2 of 2)]
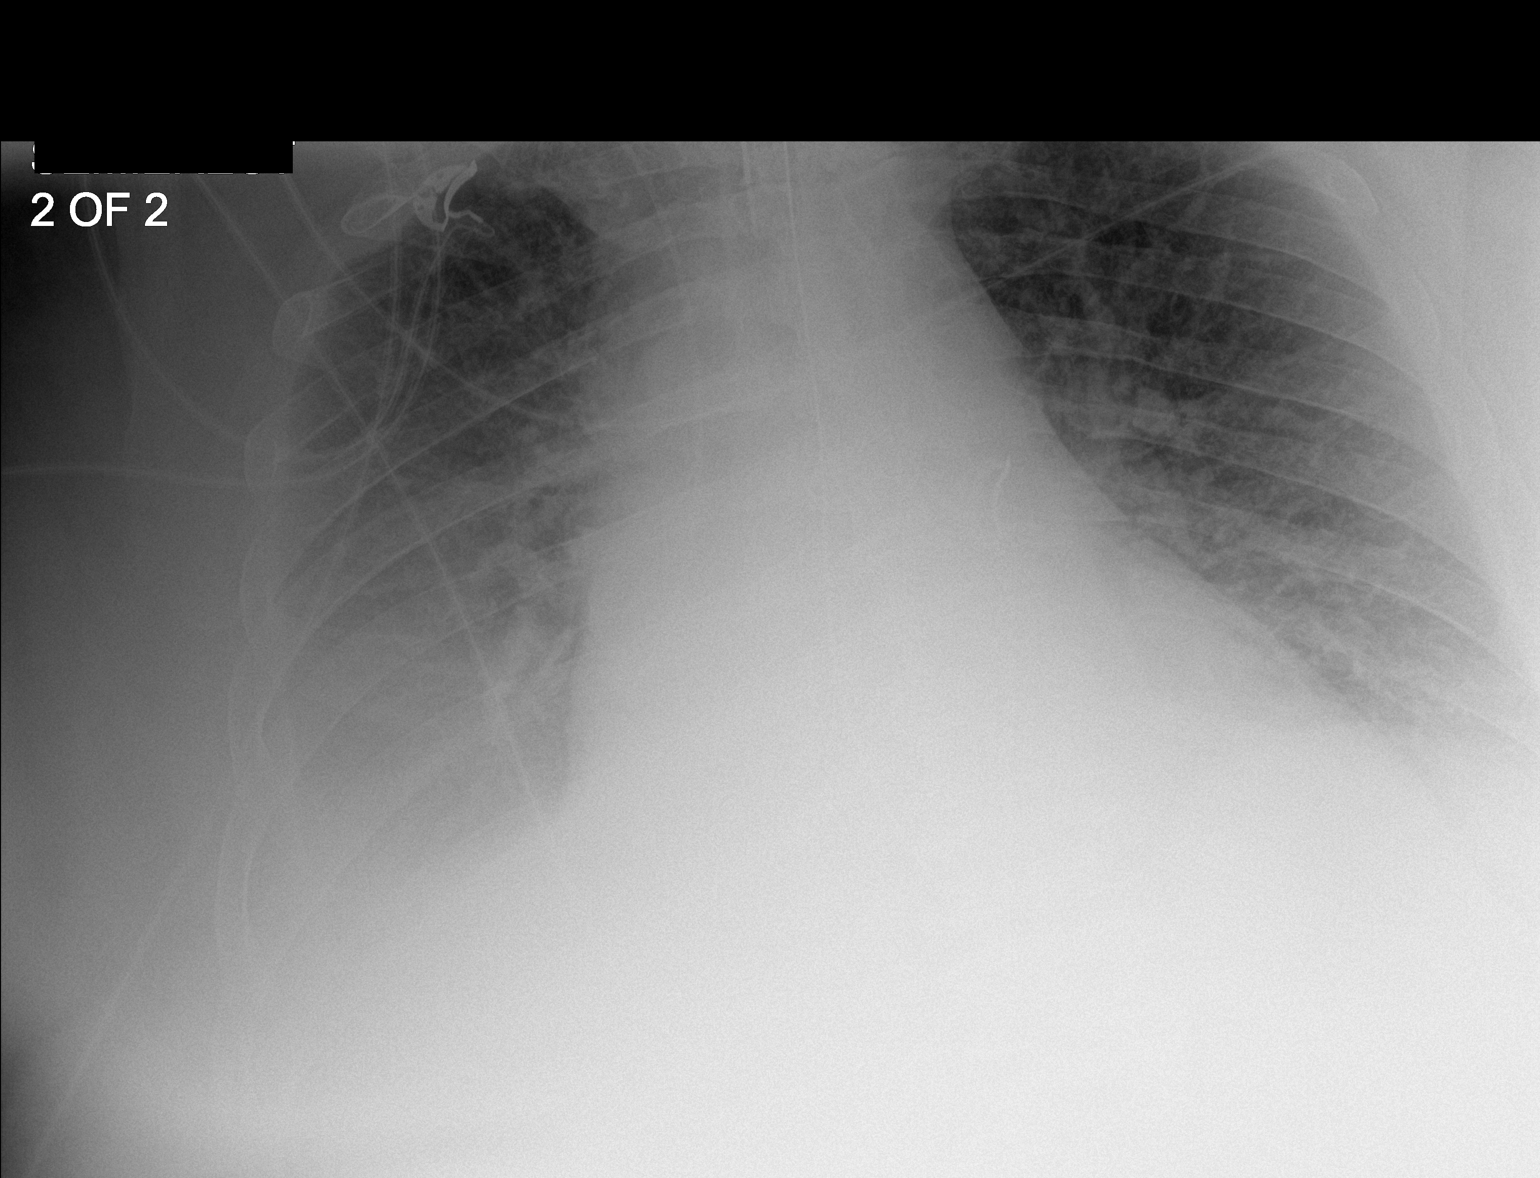

[2 of 2 positions shown; findings below may reference images not displayed]

FINDINGS: Cardiomediastinal silhouette likely unchanged, with the borders
partially obscured by overlying lung and pleural disease.

Interlobular septal thickening. Veiled opacity of the right greater
than left lung with obscuration the right hemidiaphragm and right
heart border. Hazy opacity at the left base.

Likely right-sided pleural effusion.

No pneumothorax.

Unchanged position of the endotracheal tube which terminates
approximately 3.4 cm above the carina.

Gastric tube projects over the mediastinum terminating in the
abdomen.
IMPRESSION: Unchanged endotracheal tube which terminates suitably above the
carina.

Unchanged gastric tube.

Evidence of pulmonary edema and right-sided pleural effusion, with
associated atelectasis/consolidation. Left pleural effusion not
excluded.

Cardiomegaly

## 2017-07-03 IMAGING — DX DG CHEST 1V PORT
2 series · 2 of 2 positions shown · non-contrast
Comparison: 05/20/2016 and earlier.

CLINICAL DATA: 55-year-old male with morbid obesity and respiratory
failure. Intubated.

EXAM:
PORTABLE CHEST 1 VIEW

[chest ap (1 of 2)]
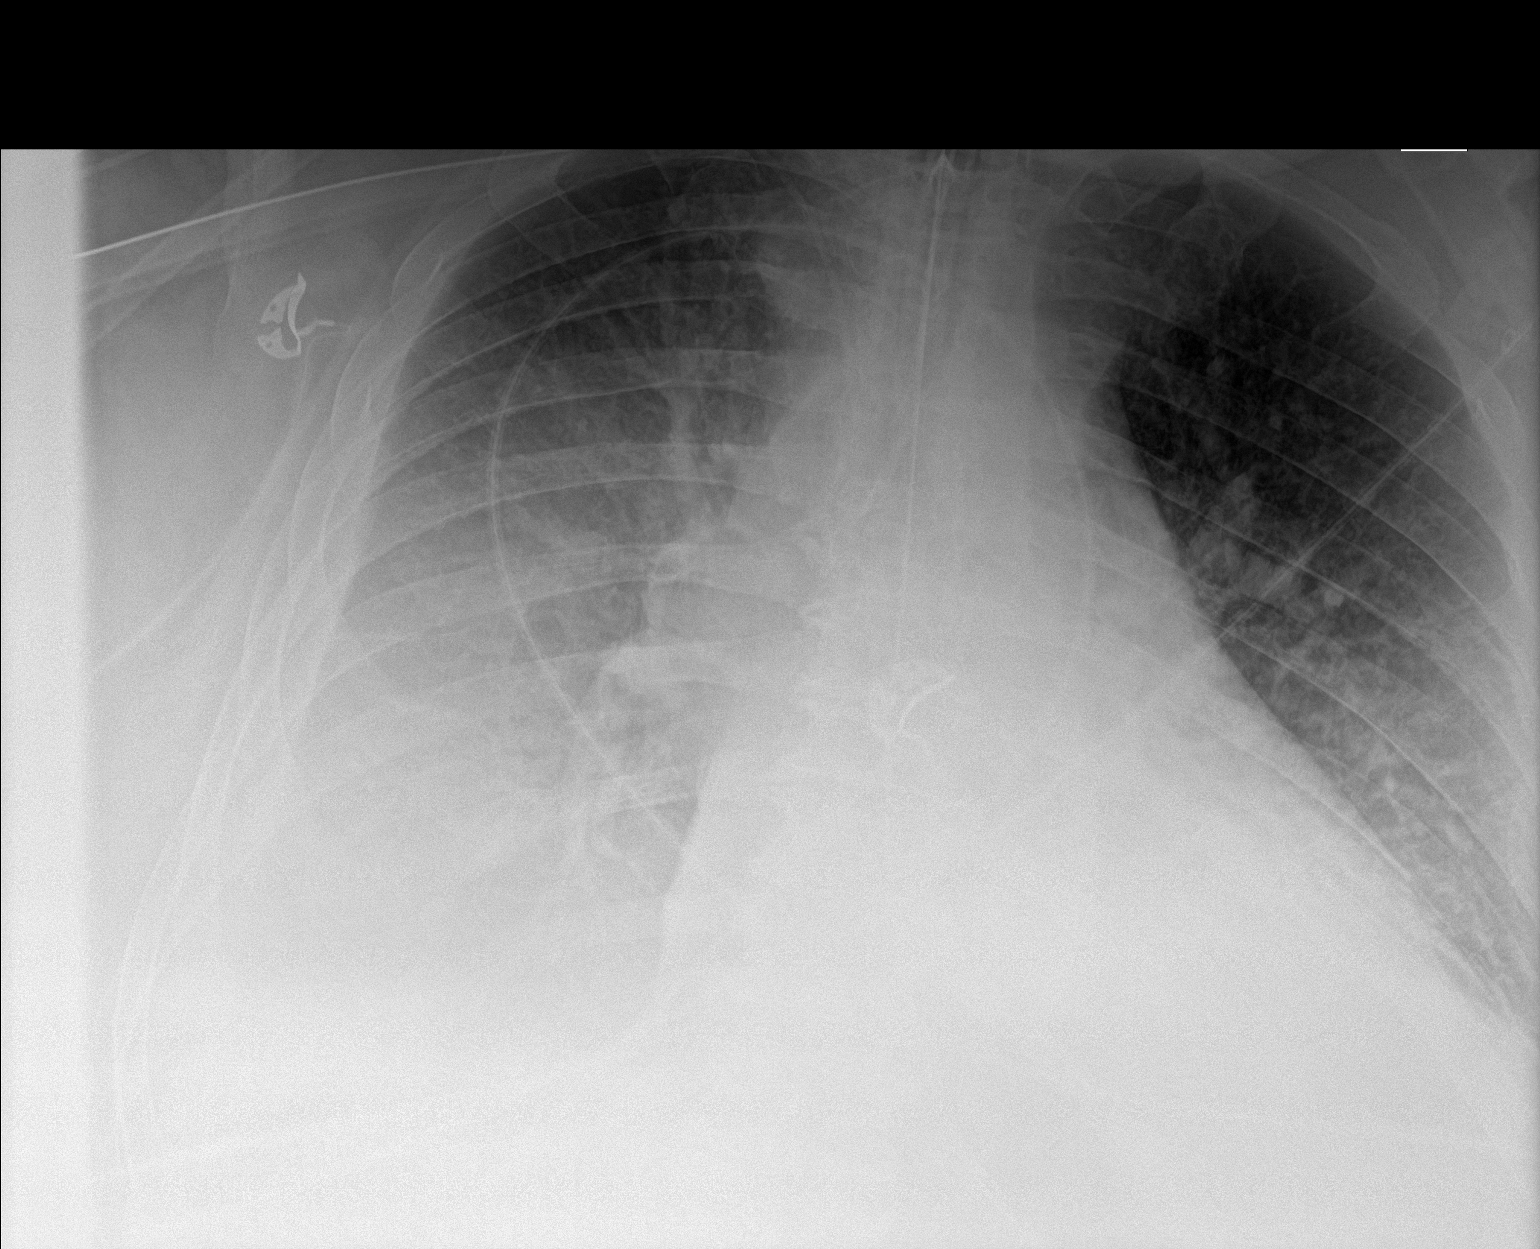

[chest ap (2 of 2)]
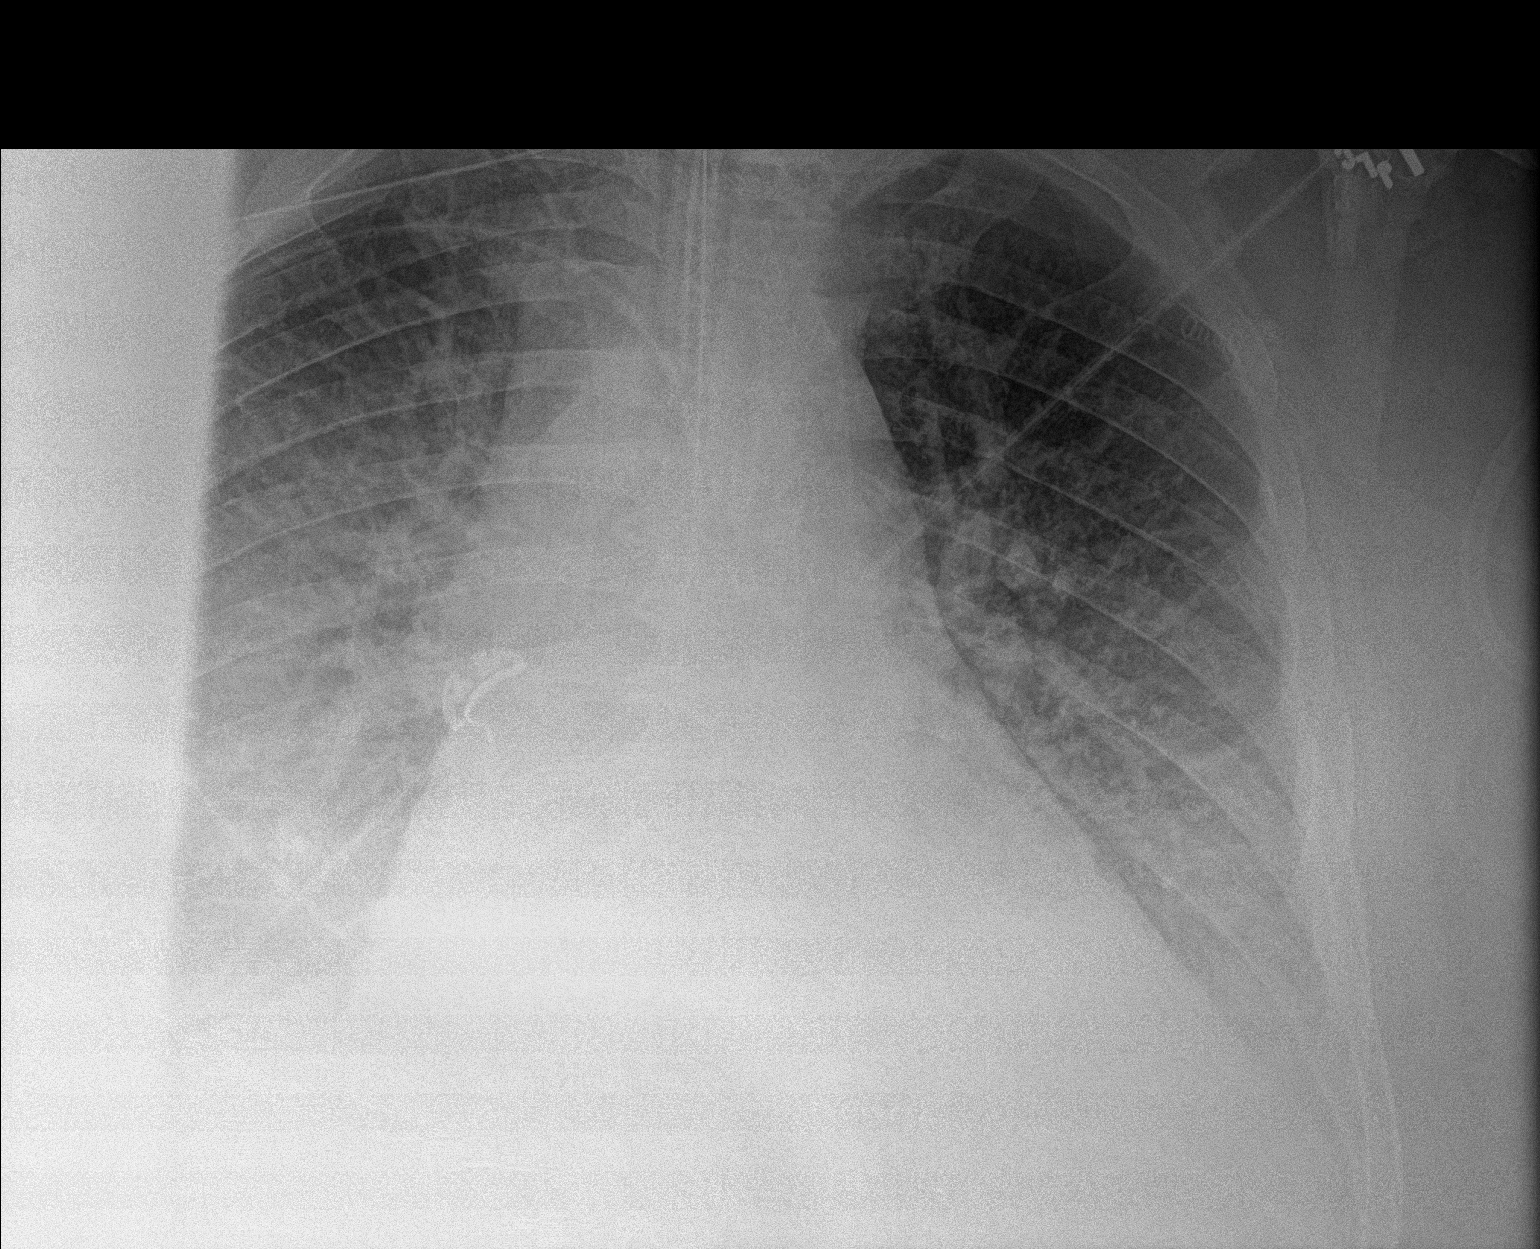

[2 of 2 positions shown; findings below may reference images not displayed]

FINDINGS: Portable AP semi upright view at at [id number] hours. Endotracheal tube
tip appears stable between the clavicles and carina. Stable right
PICC line or subclavian approach central line. Enteric tube courses
the abdomen, tip not included.

Continued veiling opacity on the right and dense retrocardiac
opacity obscuring the diaphragm. Stable pulmonary vascularity, no
overt pulmonary edema. Stable cardiac size and mediastinal contours.
IMPRESSION: 1.  Stable lines and tubes.
2. Stable ventilation with moderate size right pleural effusion and
bilateral lower lobe collapse or consolidation suspected.
3. Stable pulmonary vascularity without overt edema.
